# Patient Record
Sex: Male | Born: 1985 | Race: Black or African American | Hispanic: No | Marital: Single | Smoking: Current every day smoker
Health system: Southern US, Community
[De-identification: ages and names within clinical notes are randomized; demographics above are authoritative.]

## PROBLEM LIST (undated history)

## (undated) DIAGNOSIS — N186 End stage renal disease: Secondary | ICD-10-CM

## (undated) DIAGNOSIS — I739 Peripheral vascular disease, unspecified: Secondary | ICD-10-CM

## (undated) DIAGNOSIS — I10 Essential (primary) hypertension: Secondary | ICD-10-CM

## (undated) DIAGNOSIS — I2510 Atherosclerotic heart disease of native coronary artery without angina pectoris: Secondary | ICD-10-CM

## (undated) DIAGNOSIS — I639 Cerebral infarction, unspecified: Secondary | ICD-10-CM

## (undated) DIAGNOSIS — E782 Mixed hyperlipidemia: Secondary | ICD-10-CM

## (undated) DIAGNOSIS — E109 Type 1 diabetes mellitus without complications: Secondary | ICD-10-CM

## (undated) DIAGNOSIS — Z720 Tobacco use: Secondary | ICD-10-CM

## (undated) DIAGNOSIS — D638 Anemia in other chronic diseases classified elsewhere: Secondary | ICD-10-CM

## (undated) DIAGNOSIS — Z9289 Personal history of other medical treatment: Secondary | ICD-10-CM

## (undated) HISTORY — PX: OTHER SURGICAL HISTORY: SHX169

## (undated) HISTORY — PX: LEG AMPUTATION BELOW KNEE: SHX694

---

## 2015-03-21 DIAGNOSIS — E10649 Type 1 diabetes mellitus with hypoglycemia without coma: Secondary | ICD-10-CM | POA: Diagnosis present

## 2015-03-22 DIAGNOSIS — D631 Anemia in chronic kidney disease: Secondary | ICD-10-CM | POA: Diagnosis present

## 2015-03-22 DIAGNOSIS — N186 End stage renal disease: Secondary | ICD-10-CM | POA: Diagnosis present

## 2015-12-04 DIAGNOSIS — N186 End stage renal disease: Secondary | ICD-10-CM | POA: Diagnosis present

## 2015-12-04 DIAGNOSIS — I739 Peripheral vascular disease, unspecified: Secondary | ICD-10-CM | POA: Diagnosis present

## 2016-06-22 DIAGNOSIS — Z8673 Personal history of transient ischemic attack (TIA), and cerebral infarction without residual deficits: Secondary | ICD-10-CM

## 2016-06-22 DIAGNOSIS — F32A Depression, unspecified: Secondary | ICD-10-CM | POA: Diagnosis present

## 2016-06-22 DIAGNOSIS — F418 Other specified anxiety disorders: Secondary | ICD-10-CM | POA: Diagnosis present

## 2020-10-28 ENCOUNTER — Other Ambulatory Visit: Payer: Self-pay

## 2020-10-28 ENCOUNTER — Emergency Department
Admission: EM | Admit: 2020-10-28 | Discharge: 2020-10-28 | Disposition: A | Discharge status: Home / Self Care | Payer: Medicare PPO | Attending: Emergency Medicine | Admitting: Emergency Medicine

## 2020-10-28 ENCOUNTER — Emergency Department: Payer: Medicare PPO

## 2020-10-28 DIAGNOSIS — E1122 Type 2 diabetes mellitus with diabetic chronic kidney disease: Secondary | ICD-10-CM | POA: Insufficient documentation

## 2020-10-28 DIAGNOSIS — Z992 Dependence on renal dialysis: Secondary | ICD-10-CM | POA: Diagnosis not present

## 2020-10-28 DIAGNOSIS — N186 End stage renal disease: Secondary | ICD-10-CM

## 2020-10-28 DIAGNOSIS — R002 Palpitations: Secondary | ICD-10-CM | POA: Diagnosis not present

## 2020-10-28 DIAGNOSIS — I120 Hypertensive chronic kidney disease with stage 5 chronic kidney disease or end stage renal disease: Secondary | ICD-10-CM | POA: Diagnosis not present

## 2020-10-28 DIAGNOSIS — M79601 Pain in right arm: Secondary | ICD-10-CM | POA: Diagnosis not present

## 2020-10-28 DIAGNOSIS — R0789 Other chest pain: Secondary | ICD-10-CM | POA: Diagnosis not present

## 2020-10-28 DIAGNOSIS — M25561 Pain in right knee: Secondary | ICD-10-CM | POA: Diagnosis not present

## 2020-10-28 LAB — COMPREHENSIVE METABOLIC PANEL
ALT: 16 U/L (ref 0–44)
AST: 21 U/L (ref 15–41)
Albumin: 4.3 g/dL (ref 3.5–5.0)
Alkaline Phosphatase: 137 U/L — ABNORMAL HIGH (ref 38–126)
Anion gap: 18 — ABNORMAL HIGH (ref 5–15)
BUN: 48 mg/dL — ABNORMAL HIGH (ref 6–20)
CO2: 25 mmol/L (ref 22–32)
Calcium: 9.9 mg/dL (ref 8.9–10.3)
Chloride: 92 mmol/L — ABNORMAL LOW (ref 98–111)
Creatinine, Ser: 9.9 mg/dL — ABNORMAL HIGH (ref 0.61–1.24)
GFR, Estimated: 6 mL/min — ABNORMAL LOW (ref >60)
Glucose, Bld: 150 mg/dL — ABNORMAL HIGH (ref 70–99)
Potassium: 5.3 mmol/L — ABNORMAL HIGH (ref 3.5–5.1)
Sodium: 135 mmol/L (ref 135–145)
Total Bilirubin: 0.8 mg/dL (ref 0.3–1.2)
Total Protein: 8.4 g/dL — ABNORMAL HIGH (ref 6.5–8.1)

## 2020-10-28 LAB — CBC WITH DIFFERENTIAL/PLATELET
Abs Immature Granulocytes: 0.02 10*3/uL (ref 0.00–0.07)
Basophils Absolute: 0.1 10*3/uL (ref 0.0–0.1)
Basophils Relative: 1 %
Eosinophils Absolute: 0.4 10*3/uL (ref 0.0–0.5)
Eosinophils Relative: 5 %
HCT: 37.2 % — ABNORMAL LOW (ref 39.0–52.0)
Hemoglobin: 12.5 g/dL — ABNORMAL LOW (ref 13.0–17.0)
Immature Granulocytes: 0 %
Lymphocytes Relative: 27 %
Lymphs Abs: 2.2 10*3/uL (ref 0.7–4.0)
MCH: 31.2 pg (ref 26.0–34.0)
MCHC: 33.6 g/dL (ref 30.0–36.0)
MCV: 92.8 fL (ref 80.0–100.0)
Monocytes Absolute: 0.7 10*3/uL (ref 0.1–1.0)
Monocytes Relative: 9 %
Neutro Abs: 5 10*3/uL (ref 1.7–7.7)
Neutrophils Relative %: 58 %
Platelets: 191 10*3/uL (ref 150–400)
RBC: 4.01 MIL/uL — ABNORMAL LOW (ref 4.22–5.81)
RDW: 15.3 % (ref 11.5–15.5)
WBC: 8.4 10*3/uL (ref 4.0–10.5)
nRBC: 0 % (ref 0.0–0.2)

## 2020-10-28 LAB — TROPONIN I (HIGH SENSITIVITY)
Troponin I (High Sensitivity): 17 ng/L (ref <18)
Troponin I (High Sensitivity): 18 ng/L — ABNORMAL HIGH (ref <18)

## 2020-10-28 MED ORDER — MORPHINE SULFATE (PF) 4 MG/ML IV SOLN
4.0000 mg | Freq: Once | INTRAVENOUS | Status: DC
  Filled 2020-10-28: qty 1

## 2020-10-28 NOTE — ED Provider Notes (Addendum)
[facility] Emergency Department Provider Note ____________________________________________   Event Date/Time   First MD Initiated Contact with Patient 10/28/20 [phone]     (approximate)  I have reviewed the triage vital signs and the nursing notes.  HISTORY  Chief Complaint Chest Pain   HPI Raymond Lamb is a 35 y.o. malewho presents to the ED for evaluation of chest pain.   Chart review indicates no hx within our system.  Looks like majority of care is in the [facility] system.  History of HTN, HLD, DM, CVA on Plavix, GERD depression. Pt reports ESRD on T,Th,S iHD, last going on Tuesday. Reports hx stroke and cardiac arrest associated with severe hyperkalemia in the past.   Patient does to the ED for evaluation of 2-3 days of palpitations, and developing chest pain this evening.  Patient reports intermittent palpitations since finishing his dialysis session this past Tuesday, with a couple episodes per day lasting a matter of minutes before self resolving.  He reports developing chest pain/pressure while home alone this evening alongside his palpitations.  He reports rolling his wheelchair down the hallway, causing a stumbling fall where wheels caught and he fell forward catching himself with his right knee and right arm.  Patient reports resolving substernal chest pain, now 4/10 intensity.  Reports palpitations are also improving.  Does report 3/10 intensity right knee pain after his fall.   No past medical history on file.  There are no problems to display for this patient.    Prior to Admission medications   Not on File    Allergies Baclofen and Lisinopril  No family history on file.  Social History    Review of Systems  Constitutional: No fever/chills Eyes: No visual changes. ENT: No sore throat. Cardiovascular: Positive chest pain Respiratory: Denies shortness of breath. Gastrointestinal: No abdominal pain.  No nausea, no  vomiting.  No diarrhea.  No constipation. Genitourinary: Negative for dysuria. Musculoskeletal: Negative for back pain.  Positive for right knee pain after mechanical fall Skin: Negative for rash. Neurological: Negative for headaches, focal weakness or numbness.   ____________________________________________   PHYSICAL EXAM:  VITAL SIGNS: Vitals:   10/28/20 0400 10/28/20 0518  BP: (!) 176/98 (!) 161/97  Pulse: 84 80  Resp: 19 16  Temp:  98.2 F (36.8 C)  SpO2: 98% 100%    Constitutional: Alert and oriented. Well appearing and in no acute distress.  Pleasant and conversational in full sentences Eyes: Conjunctivae are normal. PERRL. EOMI. Head: Atraumatic. Nose: No congestion/rhinnorhea. Mouth/Throat: Mucous membranes are moist.  Oropharynx non-erythematous. Neck: No stridor. No cervical spine tenderness to palpation. Cardiovascular: Normal rate, regular rhythm. Grossly normal heart sounds.  Good peripheral circulation. Respiratory: Normal respiratory effort.  No retractions. Lungs CTAB. Gastrointestinal: Soft , nondistended, nontender to palpation. No CVA tenderness. Musculoskeletal:No joint effusions.  Mild tenderness over right-sided patella without evidence of effusion, laceration or open injury. Palpation of all 4 extremities otherwise without evidence of deformity, trauma or tenderness AV fistula to left lower arm with palpable thrill Neurologic:  Normal speech and language.  Contractures to left upper extremities at baseline, otherwise no evidence of focal neurologic deficits. Skin:  Skin is warm, dry and intact. No rash noted. Psychiatric: Mood and affect are normal. Speech and behavior are normal. ____________________________________________   LABS (all labs ordered are listed, but only abnormal results are displayed)  Labs Reviewed  COMPREHENSIVE METABOLIC PANEL - Abnormal; Notable for the following components:      Result  Value   Potassium 5.3 (*)    Chloride  92 (*)    Glucose, Bld 150 (*)    BUN 48 (*)    Creatinine, Ser 9.90 (*)    Total Protein 8.4 (*)    Alkaline Phosphatase 137 (*)    GFR, Estimated 6 (*)    Anion gap 18 (*)    All other components within normal limits  CBC WITH DIFFERENTIAL/PLATELET - Abnormal; Notable for the following components:   RBC 4.01 (*)    Hemoglobin 12.5 (*)    HCT 37.2 (*)    All other components within normal limits  TROPONIN I (HIGH SENSITIVITY) - Abnormal; Notable for the following components:   Troponin I (High Sensitivity) 18 (*)    All other components within normal limits  TROPONIN I (HIGH SENSITIVITY)   ____________________________________________  12 Lead EKG  Sinus rhythm, rate of 94 bpm.  Normal axis and intervals.  Stigmata of LVH.  No evidence of acute ischemia ____________________________________________  RADIOLOGY  ED MD interpretation: 2 view CXR reviewed by me without evidence of acute cardiopulmonary pathology  Official radiology report(s): DG Chest 2 View  Result Date: 10/28/2020 CLINICAL DATA:  Young dialysis patient atypical chest pain EXAM: CHEST - 2 VIEW COMPARISON:  None. FINDINGS: Some coarsened interstitial changes are noted throughout the lungs which can be seen as sequela of chronic dialysis. No significant pulmonary vascular congestion or convincing features of pulmonary edema are present at this time. No consolidation. No pneumothorax or visible effusion the portions of the costophrenic sulci are collimated. Cardiomediastinal contours are unremarkable. Telemetry leads overlie the chest. No acute osseous or soft tissue abnormality. IMPRESSION: No acute cardiopulmonary abnormality Chronically coarsened interstitial changes, can be seen in the setting of chronic dialysis. Electronically Signed   By: Lovena Le M.D.   On: 10/28/2020 03:44   DG Knee Complete 4 Views Right  Result Date: 10/28/2020 CLINICAL DATA:  Status post fall. EXAM: RIGHT KNEE - COMPLETE 4+ VIEW  COMPARISON:  None. FINDINGS: No evidence of fracture, dislocation, or joint effusion. No evidence of arthropathy or other focal bone abnormality. A radiopaque vascular stent is seen along the posteromedial aspect of the distal right thigh. IMPRESSION: No acute osseous abnormality. Electronically Signed   By: Virgina Norfolk M.D.   On: 10/28/2020 04:16    ____________________________________________   PROCEDURES and INTERVENTIONS  Procedure(s) performed (including Critical Care):  .1-3 Lead EKG Interpretation Performed by: Vladimir Crofts, MD Authorized by: Vladimir Crofts, MD     Interpretation: normal     ECG rate:  84   ECG rate assessment: normal     Rhythm: sinus rhythm     Ectopy: none     Conduction: normal      Medications  morphine 4 MG/ML injection 4 mg (has no administration in time range)    ____________________________________________   MDM / ED COURSE   35 year old patient on hemodialysis, but without cardiac history, presents to the ED with atypical chest pains, without evidence of acute pathology and amenable to outpatient management.  Mild and persistent hypertension, otherwise normal vitals on room air.  Exam reassuring without evidence of distress, neurologic or vascular deficits.  Minimal tenderness to his right-sided patella after mechanical fall today, and follow-up x-ray demonstrates no fracture or dislocation.  No evidence of ACS, PTX or CAP on his work-up.  Has resolution of chest pain and remains asymptomatic in the ED. blood work shows minimal hyperkalemia, in the setting of hemolysis on lab draw,  his reported adherence to dialysis, and no EKG evidence of hyper kalemia.  I see no barriers to outpatient management at this time and return precautions for the ED were discussed.   Clinical Course as of 10/28/20 0610  Thu Oct 28, 2020  0607 Reassessed.  Patient was feeling well and has no chest pain.  We discussed benign cardiac work-up.  We discussed the  importance of adherence to his dialysis regimen.  We discussed return precautions for the ED. [DS]    Clinical Course User Index [DS] Vladimir Crofts, MD    ____________________________________________   FINAL CLINICAL IMPRESSION(S) / ED DIAGNOSES  Final diagnoses:  Other chest pain  ESRD (end stage renal disease) on dialysis [facility])     ED Discharge Orders    None       Leane Loring   Note:  This document was prepared using Dragon voice recognition software and may include unintentional dictation errors.   Vladimir Crofts, MD 10/28/20 [place], [place], MD 10/28/20 [phone]

## 2020-10-28 NOTE — ED Triage Notes (Signed)
Pt BIBA for eval of mid sternal chest pressure, 6/10 now, given 324 mg ASA by EMS, pt in NAD, dialysis pt T/Th/Sat, last appt 2 days ago

## 2020-10-28 NOTE — ED Notes (Signed)
Offered ordered morphine to pt, pt declined at this time, sts "I don't think I need morphine right now, the pain is easing up"; pt a/o x 4 GCS 15, ED MD made aware

## 2020-10-28 NOTE — Discharge Instructions (Signed)
As we discussed, no signs of heart attack or similar issues with your heart.  Please ensure you continue your dialysis.  Return to the ED with any further worsening chest pain

## 2020-11-16 DIAGNOSIS — I2510 Atherosclerotic heart disease of native coronary artery without angina pectoris: Secondary | ICD-10-CM | POA: Diagnosis present

## 2020-11-30 ENCOUNTER — Encounter: Payer: Self-pay | Admitting: Internal Medicine

## 2020-11-30 ENCOUNTER — Other Ambulatory Visit: Payer: Self-pay

## 2020-11-30 ENCOUNTER — Inpatient Hospital Stay
Admission: EM | Admit: 2020-11-30 | Discharge: 2020-12-02 | DRG: 638 | Disposition: A | Discharge status: Home / Self Care | Payer: Medicare PPO | Attending: Internal Medicine | Admitting: Internal Medicine

## 2020-11-30 DIAGNOSIS — E1022 Type 1 diabetes mellitus with diabetic chronic kidney disease: Secondary | ICD-10-CM | POA: Diagnosis present

## 2020-11-30 DIAGNOSIS — I2510 Atherosclerotic heart disease of native coronary artery without angina pectoris: Secondary | ICD-10-CM | POA: Diagnosis present

## 2020-11-30 DIAGNOSIS — Z992 Dependence on renal dialysis: Secondary | ICD-10-CM

## 2020-11-30 DIAGNOSIS — Z885 Allergy status to narcotic agent status: Secondary | ICD-10-CM

## 2020-11-30 DIAGNOSIS — I69354 Hemiplegia and hemiparesis following cerebral infarction affecting left non-dominant side: Secondary | ICD-10-CM

## 2020-11-30 DIAGNOSIS — Z794 Long term (current) use of insulin: Secondary | ICD-10-CM

## 2020-11-30 DIAGNOSIS — Z993 Dependence on wheelchair: Secondary | ICD-10-CM

## 2020-11-30 DIAGNOSIS — Z7902 Long term (current) use of antithrombotics/antiplatelets: Secondary | ICD-10-CM

## 2020-11-30 DIAGNOSIS — Z7982 Long term (current) use of aspirin: Secondary | ICD-10-CM

## 2020-11-30 DIAGNOSIS — Z888 Allergy status to other drugs, medicaments and biological substances status: Secondary | ICD-10-CM

## 2020-11-30 DIAGNOSIS — E875 Hyperkalemia: Secondary | ICD-10-CM | POA: Diagnosis present

## 2020-11-30 DIAGNOSIS — Z91041 Radiographic dye allergy status: Secondary | ICD-10-CM

## 2020-11-30 DIAGNOSIS — Z9111 Patient's noncompliance with dietary regimen: Secondary | ICD-10-CM

## 2020-11-30 DIAGNOSIS — I739 Peripheral vascular disease, unspecified: Secondary | ICD-10-CM

## 2020-11-30 DIAGNOSIS — N2581 Secondary hyperparathyroidism of renal origin: Secondary | ICD-10-CM | POA: Diagnosis present

## 2020-11-30 DIAGNOSIS — F418 Other specified anxiety disorders: Secondary | ICD-10-CM | POA: Diagnosis present

## 2020-11-30 DIAGNOSIS — F32A Depression, unspecified: Secondary | ICD-10-CM | POA: Diagnosis not present

## 2020-11-30 DIAGNOSIS — E162 Hypoglycemia, unspecified: Secondary | ICD-10-CM | POA: Diagnosis present

## 2020-11-30 DIAGNOSIS — Z833 Family history of diabetes mellitus: Secondary | ICD-10-CM

## 2020-11-30 DIAGNOSIS — Z87891 Personal history of nicotine dependence: Secondary | ICD-10-CM

## 2020-11-30 DIAGNOSIS — N186 End stage renal disease: Secondary | ICD-10-CM | POA: Diagnosis present

## 2020-11-30 DIAGNOSIS — R4189 Other symptoms and signs involving cognitive functions and awareness: Secondary | ICD-10-CM

## 2020-11-30 DIAGNOSIS — E10649 Type 1 diabetes mellitus with hypoglycemia without coma: Principal | ICD-10-CM | POA: Diagnosis present

## 2020-11-30 DIAGNOSIS — I120 Hypertensive chronic kidney disease with stage 5 chronic kidney disease or end stage renal disease: Secondary | ICD-10-CM | POA: Diagnosis present

## 2020-11-30 DIAGNOSIS — Z79899 Other long term (current) drug therapy: Secondary | ICD-10-CM

## 2020-11-30 DIAGNOSIS — E1051 Type 1 diabetes mellitus with diabetic peripheral angiopathy without gangrene: Secondary | ICD-10-CM | POA: Diagnosis present

## 2020-11-30 DIAGNOSIS — D631 Anemia in chronic kidney disease: Secondary | ICD-10-CM | POA: Diagnosis present

## 2020-11-30 DIAGNOSIS — Z8673 Personal history of transient ischemic attack (TIA), and cerebral infarction without residual deficits: Secondary | ICD-10-CM

## 2020-11-30 DIAGNOSIS — Z20822 Contact with and (suspected) exposure to covid-19: Secondary | ICD-10-CM | POA: Diagnosis present

## 2020-11-30 HISTORY — DX: Type 1 diabetes mellitus without complications: E10.9

## 2020-11-30 LAB — COMPREHENSIVE METABOLIC PANEL
ALT: 22 U/L (ref 0–44)
AST: 19 U/L (ref 15–41)
Albumin: 3.4 g/dL — ABNORMAL LOW (ref 3.5–5.0)
Alkaline Phosphatase: 141 U/L — ABNORMAL HIGH (ref 38–126)
Anion gap: 10 (ref 5–15)
BUN: 22 mg/dL — ABNORMAL HIGH (ref 6–20)
CO2: 27 mmol/L (ref 22–32)
Calcium: 8.6 mg/dL — ABNORMAL LOW (ref 8.9–10.3)
Chloride: 99 mmol/L (ref 98–111)
Creatinine, Ser: 4.78 mg/dL — ABNORMAL HIGH (ref 0.61–1.24)
GFR, Estimated: 15 mL/min — ABNORMAL LOW (ref >60)
Glucose, Bld: 93 mg/dL (ref 70–99)
Potassium: 4.1 mmol/L (ref 3.5–5.1)
Sodium: 136 mmol/L (ref 135–145)
Total Bilirubin: 0.6 mg/dL (ref 0.3–1.2)
Total Protein: 7.6 g/dL (ref 6.5–8.1)

## 2020-11-30 LAB — CBC
HCT: 27.8 % — ABNORMAL LOW (ref 39.0–52.0)
Hemoglobin: 8.7 g/dL — ABNORMAL LOW (ref 13.0–17.0)
MCH: 30.5 pg (ref 26.0–34.0)
MCHC: 31.3 g/dL (ref 30.0–36.0)
MCV: 97.5 fL (ref 80.0–100.0)
Platelets: 280 10*3/uL (ref 150–400)
RBC: 2.85 MIL/uL — ABNORMAL LOW (ref 4.22–5.81)
RDW: 15.7 % — ABNORMAL HIGH (ref 11.5–15.5)
WBC: 9.5 10*3/uL (ref 4.0–10.5)
nRBC: 0 % (ref 0.0–0.2)

## 2020-11-30 LAB — RESP PANEL BY RT-PCR (FLU A&B, COVID) ARPGX2
Influenza A by PCR: NEGATIVE
Influenza B by PCR: NEGATIVE
SARS Coronavirus 2 by RT PCR: NEGATIVE

## 2020-11-30 LAB — TSH: TSH: 1.799 u[IU]/mL (ref 0.350–4.500)

## 2020-11-30 LAB — MAGNESIUM: Magnesium: 2 mg/dL (ref 1.7–2.4)

## 2020-11-30 LAB — CBG MONITORING, ED
Glucose-Capillary: 94 mg/dL (ref 70–99)
Glucose-Capillary: 51 mg/dL — ABNORMAL LOW (ref 70–99)

## 2020-11-30 MED ORDER — ASPIRIN EC 81 MG PO TBEC
81.0000 mg | DELAYED_RELEASE_TABLET | Freq: Every day | ORAL | Status: DC
  Administered 2020-12-01 – 2020-12-02 (×2): 81 mg via ORAL
  Filled 2020-11-30 (×3): qty 1

## 2020-11-30 MED ORDER — ACETAMINOPHEN 325 MG PO TABS: 650.0000 mg | ORAL_TABLET | Freq: Four times a day (QID) | ORAL | Status: DC | PRN

## 2020-11-30 MED ORDER — DEXTROSE-NACL 10-0.45 % IV SOLN: INTRAVENOUS | Status: DC

## 2020-11-30 MED ORDER — POLYETHYLENE GLYCOL 3350 17 G PO PACK: 17.0000 g | PACK | Freq: Every day | ORAL | Status: DC | PRN

## 2020-11-30 MED ORDER — HYDRALAZINE HCL 50 MG PO TABS
100.0000 mg | ORAL_TABLET | Freq: Three times a day (TID) | ORAL | Status: DC
  Administered 2020-12-01 – 2020-12-02 (×3): 100 mg via ORAL
  Filled 2020-11-30 (×3): qty 2

## 2020-11-30 MED ORDER — AMLODIPINE BESYLATE 10 MG PO TABS
10.0000 mg | ORAL_TABLET | Freq: Every day | ORAL | Status: DC
  Administered 2020-12-01 – 2020-12-02 (×2): 10 mg via ORAL
  Filled 2020-11-30: qty 1
  Filled 2020-11-30: qty 2
  Filled 2020-11-30: qty 1

## 2020-11-30 MED ORDER — DEXTROSE 10 % IV SOLN: INTRAVENOUS | Status: DC

## 2020-11-30 MED ORDER — SODIUM CHLORIDE 0.9% FLUSH
3.0000 mL | Freq: Two times a day (BID) | INTRAVENOUS | Status: DC
  Administered 2020-12-01 (×2): 3 mL via INTRAVENOUS

## 2020-11-30 MED ORDER — CLOPIDOGREL BISULFATE 75 MG PO TABS
75.0000 mg | ORAL_TABLET | Freq: Every day | ORAL | Status: DC
  Administered 2020-12-01 – 2020-12-02 (×2): 75 mg via ORAL
  Filled 2020-11-30 (×2): qty 1

## 2020-11-30 MED ORDER — PANTOPRAZOLE SODIUM 40 MG PO TBEC
40.0000 mg | DELAYED_RELEASE_TABLET | Freq: Every day | ORAL | Status: DC
  Administered 2020-12-01 – 2020-12-02 (×2): 40 mg via ORAL
  Filled 2020-11-30 (×2): qty 1

## 2020-11-30 MED ORDER — ATORVASTATIN CALCIUM 20 MG PO TABS
80.0000 mg | ORAL_TABLET | Freq: Every day | ORAL | Status: DC
  Administered 2020-12-01 – 2020-12-02 (×2): 80 mg via ORAL
  Filled 2020-11-30 (×3): qty 4

## 2020-11-30 MED ORDER — ACETAMINOPHEN 650 MG RE SUPP: 650.0000 mg | Freq: Four times a day (QID) | RECTAL | Status: DC | PRN

## 2020-11-30 MED ORDER — CARVEDILOL 25 MG PO TABS
37.5000 mg | ORAL_TABLET | Freq: Two times a day (BID) | ORAL | Status: DC
  Administered 2020-12-01 – 2020-12-02 (×3): 37.5 mg via ORAL
  Filled 2020-11-30 (×3): qty 1

## 2020-11-30 MED ORDER — HEPARIN SODIUM (PORCINE) 5000 UNIT/ML IJ SOLN
5000.0000 [IU] | Freq: Three times a day (TID) | INTRAMUSCULAR | Status: DC
  Administered 2020-12-01 – 2020-12-02 (×4): 5000 [IU] via SUBCUTANEOUS
  Filled 2020-11-30 (×5): qty 1

## 2020-11-30 MED ORDER — SODIUM CHLORIDE 0.9 % IV BOLUS: 1000.0000 mL | Freq: Once | INTRAVENOUS | Status: DC

## 2020-11-30 NOTE — ED Notes (Signed)
MD notified regarding pt's hypoglycemia.

## 2020-11-30 NOTE — ED Provider Notes (Addendum)
[facility] Emergency Department Provider Note  Time seen: 9:51 PM  I have reviewed the triage vital signs and the nursing notes.   HISTORY  Chief Complaint Hypoglycemia   HPI Raymond Lamb is a 35 y.o. male with a past medical history of end-stage renal disease on hemodialysis, diabetes on insulin, presents to the emergency department for hypoglycemia.  According to EMS patient's mother called due to the patient being unresponsive.  EMS arrived found the patient have a blood glucose of 25.  Patient received dextrose by EMS which increased  blood sugar to 226 per EMS, recheck right before arrival of 126.  EMS states initially patient unresponsive to verbal and physical stimuli.  Upon arrival patient is somnolent but awakens to verbal stimuli.  Is able to converse however will fall asleep if not actively engaged.  Significant improvement per EMS.  While awake patient denies any chest pain or abdominal pain.  Largely negative review of systems.  No past medical history on file.  There are no problems to display for this patient.   Prior to Admission medications   Not on File    Allergies  Allergen Reactions  . Baclofen Itching  . Lisinopril Swelling    No family history on file.  Social History    Review of Systems Constitutional: Negative for fever. Cardiovascular: Negative for chest pain. Respiratory: Negative for shortness of breath. Gastrointestinal: Negative for abdominal pain Musculoskeletal: Negative for musculoskeletal complaints All other ROS negative  ____________________________________________   PHYSICAL EXAM:  VITAL SIGNS: ED Triage Vitals [11/30/20 2147]  Enc Vitals Group     BP      Pulse      Resp      Temp      Temp src      SpO2      Weight      Height      Head Circumference      Peak Flow      Pain Score 0     Pain Loc      Pain Edu?      Excl. in [place]?     Constitutional: Patient is somnolent but will awaken  to voice and answer questions before falling back asleep. Eyes: Normal exam ENT      Head: Normocephalic and atraumatic.      Mouth/Throat: Mucous membranes are moist. Cardiovascular: Normal rate, regular rhythm. Respiratory: Normal respiratory effort without tachypnea nor retractions. Breath sounds are clear Gastrointestinal: Soft and nontender. No distention. Musculoskeletal: Left upper extremity AV fistula at the wrist with good thrill Neurologic:  Normal speech and language. No gross focal neurologic deficits  Skin:  Skin is warm, dry and intact.  Psychiatric: Mood and affect are normal.   ____________________________________________    EKG  EKG viewed and interpreted by myself shows a sinus rhythm at 74 bpm with a narrow QRS, normal axis, normal intervals, no concerning ST changes.  ____________________________________________   INITIAL IMPRESSION / ASSESSMENT AND PLAN / ED COURSE  Pertinent labs & imaging results that were available during my care of the patient were reviewed by me and considered in my medical decision making (see chart for details).   Patient presents to the emergency department for altered mental status found to have a blood glucose of 25, known diabetic per EMS.  Patient is end-stage renal on hemodialysis.  We will check labs, and continue to closely monitor.  We will maintain hourly CBGs to ensure no return to hypoglycemia.  Patient is somewhat somnolent but overall does appear well.  Highly suspect hypoglycemia to be the cause of the patient's initial unresponsiveness.  Patient has become somnolent once again is dropped his blood glucose to 51 on recheck.  Given the patient's return to hypoglycemia we will start the patient on a D10 infusion and admit to the hospital service for further work-up and treatment.  Reassuringly patient's potassium is 4.1 does not require emergent dialysis.  Record review in Care Everywhere/[facility] shows patient is a type I diabetic  with past CVA with left-sided deficits, on hemodialysis.  Raymond Lamb was evaluated in Emergency Department on 11/30/2020 for the symptoms described in the history of present illness. He was evaluated in the context of the global COVID-19 pandemic, which necessitated consideration that the patient might be at risk for infection with the SARS-CoV-2 virus that causes COVID-19. Institutional protocols and algorithms that pertain to the evaluation of patients at risk for COVID-19 are in a state of rapid change based on information released by regulatory bodies including the CDC and federal and state organizations. These policies and algorithms were followed during the patient's care in the ED.  ____________________________________________   FINAL CLINICAL IMPRESSION(S) / ED DIAGNOSES  Hypoglycemia   Harvest Dark, MD 11/30/20 2252    Harvest Dark, MD 11/30/20 2255

## 2020-11-30 NOTE — ED Notes (Signed)
Resumed care from zach rn.  Pt drowsy.  nsr on monitor.  Iv fluids infusing.

## 2020-11-30 NOTE — ED Triage Notes (Addendum)
Pt presents to the [facility] via EMS from home with c/o hypoglycemia. EMS states that pt's mother called EMS after pt became unresponsive. EMS states that pt was found with blood glucose of 25. Pt received D10 en route which increased blood sugar to 250. Pt's blood sugar 95 upon arrival to ED. Pt only responsive to painful stimuli at this time.

## 2020-11-30 NOTE — H&P (Signed)
History and Physical   Auther Lyerly KCX:[id number] DOB: 01/12/1986 DOA: 11/30/2020  PCP: Norina Buzzard, MD   Patient coming from: Home  Chief Complaint: Hypoglycemia  HPI: Raymond Lamb is a 35 y.o. male with medical history significant of type 1 diabetes, anemia, ESRD on HD, PVD, history of CVA with left-sided weakness, depression, CAD who presents with hypoglycemia.  Patient's mother went to check on him and found him to be unresponsive.  She called EMS and when they arrived he was unresponsive with a CBG of 25.  He got at the 10 drip in route which improved his CBG to the 200s and then was around the 90s on arrival.  He had improved somewhat and become responsive with verbal stimulation but still somnolent.  His glucose then dropped back down to 50 and he was put back on the D10 drip and has had improvement of his glucose again.  Patient recently admitted to [facility] from 5/23 until 5/28 with DKA among other things.  In that discharge summary they noted that his insulin had previously been managed by his family but he did start managing it himself in the week prior to admission after an argument with family.  And he subsequently had this episode of hyperglycemia that he was admitted for and now coming in with hypoglycemia.  He was discharged from that admission on 25 units nightly, 10 units 3 times daily and sliding scale insulin.  He reports taking his usual dose of insulin today.  However he states he did not eat anything all day because he just did not feel hungry.  He also states he completed dialysis today. He denies fevers, chills, chest pain, shortness of breath, abdominal pain, constipation diarrhea, nausea, vomiting.  I spoke with his mother who reports that she feels he uses his short acting insulin too frequently including when he is not eating.  She feels that when he was discharged from [facility] his dose was little too high and that he has had variable blood sugars due to his use of short  acting and has had more lows than highs.  ED Course:Vital signs in the ED stable.  Lab work-up showed CMP with BUN 22, creatinine stable at 4.78, glucose 93, calcium 8.6 which improved considering albumin 3.4, alk phos stable 141.  CBC with stable anemia with hemoglobin of 8.7.  CBG initially 94 with 51 on repeat as per HPI.  Respiratory panel for flu and COVID pending.  Patient was started on D10 drip in the ED.  Review of Systems: As per HPI otherwise all other systems reviewed and are negative.  History reviewed. No pertinent past medical history. Diabetes Anemia, ESRD PVD CVA Depression CAD  History reviewed. No pertinent surgical history.  Social History  reports that he has quit smoking. He has never used smokeless tobacco. He reports current drug use. Drug: Marijuana. He reports that he does not drink alcohol.  Allergies  Allergen Reactions  . Baclofen Itching  . Lisinopril Swelling    Family History  Problem Relation Age of Onset  . Diabetes type I Other   . Clotting disorder Neg Hx   Reviewed on admission  Prior to Admission medications   Not on File  Aspirin 81 mg daily Amlodipine 10 mg daily Atorvastatin 80 mg daily Coreg 37.5 mg twice daily Plavix 75 mg daily Gabapentin 600 mg nightly Hydralazine 100 mg every 8 hours Hydroxyzine 10 mg as needed itching Omeprazole Sertraline 100 mg daily  Physical Exam: Vitals:  11/30/20 2330 11/30/20 2335 11/30/20 2340 11/30/20 2345  BP:   (!) 144/97   Pulse: 92 77 75 83  Resp: _0 Temp:      TempSrc:      SpO2: 100% 99% 99% 98%   Physical Exam Constitutional:      General: He is not in acute distress.    Appearance: Normal appearance.     Comments: Lethargic  HENT:     Head: Normocephalic and atraumatic.     Mouth/Throat:     Mouth: Mucous membranes are moist.     Pharynx: Oropharynx is clear.  Eyes:     Extraocular Movements: Extraocular movements intact.     Pupils: Pupils are equal, round,  and reactive to light.  Cardiovascular:     Rate and Rhythm: Normal rate and regular rhythm.     Pulses: Normal pulses.     Heart sounds: Normal heart sounds.  Pulmonary:     Effort: Pulmonary effort is normal. No respiratory distress.     Breath sounds: Normal breath sounds.  Abdominal:     General: Bowel sounds are normal. There is no distension.     Palpations: Abdomen is soft.     Tenderness: There is no abdominal tenderness.  Musculoskeletal:        General: Deformity (Left-sided contractures) present. No swelling.  Skin:    General: Skin is warm and dry.  Neurological:     General: No focal deficit present.     Mental Status: Mental status is at baseline.     Comments: Lethargic, but arousable to voice and answers questions appropriately when alert.    Labs on Admission: I have personally reviewed following labs and imaging studies  CBC: Recent Labs  Lab 11/30/20 2148  WBC 9.5  HGB 8.7*  HCT 27.8*  MCV 97.5  PLT 017    Basic Metabolic Panel: Recent Labs  Lab 11/30/20 2148  NA 136  K 4.1  CL 99  CO2 27  GLUCOSE 93  BUN 22*  CREATININE 4.78*  CALCIUM 8.6*  MG 2.0    GFR: CrCl cannot be calculated (Unknown ideal weight.).  Liver Function Tests: Recent Labs  Lab 11/30/20 2148  AST 19  ALT 22  ALKPHOS 141*  BILITOT 0.6  PROT 7.6  ALBUMIN 3.4*    Urine analysis: No results found for: COLORURINE, APPEARANCEUR, LABSPEC, PHURINE, GLUCOSEU, HGBUR, BILIRUBINUR, KETONESUR, PROTEINUR, UROBILINOGEN, NITRITE, LEUKOCYTESUR  Radiological Exams on Admission: No results found.  EKG: Independently reviewed.  Sinus rhythm at 74 bpm. QTC borderline at 496.  Assessment/Plan Principal Problem:   Type 1 diabetes mellitus with hypoglycemia without coma (HCC) Active Problems:   Anemia in chronic kidney disease (CKD)   Depression   ESRD (end stage renal disease) (HCC)   History of CVA (cerebrovascular accident)   PVD (peripheral vascular disease) ([place])    CAD (coronary artery disease)  Type 1 diabetes with hypoglycemia without coma Altered mental status > Patient presenting after being found unconscious with low CBG 25.  Some improvement in alertness with D10 drip and improvement in CBG becoming arousable and able to answer questions, however his glucose subsequently dropped again but has improved again while remaining on drip. > As per HPI, patient recently admitted to [facility] with DKA after he began managing his own insulin, now coming in with hypoglycemia suspected to be due to taking usual insulin dose despite not eating.  Discharged on 25 units nightly, 10 units 3 times daily and  SSI.  His mom states that she feels his glucose has been more variable as he relies more on short acting and that he has had more lows than highs, so he will likely need adjustment in his insulin dosing. > Continue with D10 (instead of D5) as he is end-stage renal as below we want to avoid giving too much fluid. - Monitor on telemetry for now - Continue D10 drip - Every hour CBGs, until stable for at least 2 CBGs. - Once blood glucose becomes stable and if it begins to become hyperglycemic can restart insulin therapy. - Check magnesium, TSH, UDS for completeness  ESRD on HD Tuesday Thursday Saturday > Last HD session was today > No significant electrolyte abnormalities in ED with creatinine stable at 4.78. - Avoid nephrotoxic agents - Trend renal function and electrolytes  Anemia of chronic kidney disease > Hemoglobin stable at 8.7 - Trend CBC  PVD History of CVA > With residual left-sided deficits, largely wheelchair-bound per report - Continue home aspirin, Plavix, atorvastatin  Hypertension CAD > Had type II (demand) ischemia during recent admission, found to have nonobstructive CAD. - Continue aspirin, amlodipine, Coreg, hydralazine  Depression - Holding sertraline while somnolent  DVT prophylaxis: Heparin  Code Status:   Full  Family Communication:   Mother updated by phone.  States she can be contacted with updates and and contact him when he is ready to leave as well. Disposition Plan:   Patient is from:  Home  Anticipated DC to:  Home  Anticipated DC date:  1 to 2 days  Anticipated DC barriers: None  Consults called:  None  Admission status:  Observation, stepdown   Severity of Illness: The appropriate patient status for this patient is OBSERVATION. Observation status is judged to be reasonable and necessary in order to provide the required intensity of service to ensure the patient's safety. The patient's presenting symptoms, physical exam findings, and initial radiographic and laboratory data in the context of their medical condition is felt to place them at decreased risk for further clinical deterioration. Furthermore, it is anticipated that the patient will be medically stable for discharge from the hospital within 2 midnights of admission. The following factors support the patient status of observation.   " The patient's presenting symptoms include altered mental status, hypoglycemia. " The physical exam findings include somnolence, chronic left-sided weakness and contractures. " The initial radiographic and laboratory data are CBG in the 50s, while on D10 drip.  CBG in the field 25 per report.   Marcelyn Bruins MD [facility]  How to contact the [facility] Attending or Consulting provider [place] or covering provider during after hours [place], for this patient?   1. Check the care team in [facility] and look for a) attending/consulting TR[facility] provider listed and b) the [facility] team listed 2. Log into [email or web address] and use [place]'s universal password to access. If you do not have the password, please contact the hospital operator. 3. Locate the [facility] provider you are looking for under [facility] and page to a number that you can be directly reached. 4. If you still have difficulty reaching the provider, please page the [facility]  (Director on Call) for the Hospitalists listed on amion for assistance.  12/01/2020, 12:01 AM

## 2020-12-01 DIAGNOSIS — D631 Anemia in chronic kidney disease: Secondary | ICD-10-CM | POA: Diagnosis present

## 2020-12-01 DIAGNOSIS — E875 Hyperkalemia: Secondary | ICD-10-CM | POA: Diagnosis present

## 2020-12-01 DIAGNOSIS — Z7902 Long term (current) use of antithrombotics/antiplatelets: Secondary | ICD-10-CM | POA: Diagnosis not present

## 2020-12-01 DIAGNOSIS — Z9111 Patient's noncompliance with dietary regimen: Secondary | ICD-10-CM | POA: Diagnosis not present

## 2020-12-01 DIAGNOSIS — E162 Hypoglycemia, unspecified: Secondary | ICD-10-CM | POA: Diagnosis present

## 2020-12-01 DIAGNOSIS — F32A Depression, unspecified: Secondary | ICD-10-CM | POA: Diagnosis present

## 2020-12-01 DIAGNOSIS — Z833 Family history of diabetes mellitus: Secondary | ICD-10-CM | POA: Diagnosis not present

## 2020-12-01 DIAGNOSIS — Z20822 Contact with and (suspected) exposure to covid-19: Secondary | ICD-10-CM | POA: Diagnosis present

## 2020-12-01 DIAGNOSIS — Z992 Dependence on renal dialysis: Secondary | ICD-10-CM | POA: Diagnosis not present

## 2020-12-01 DIAGNOSIS — I69354 Hemiplegia and hemiparesis following cerebral infarction affecting left non-dominant side: Secondary | ICD-10-CM | POA: Diagnosis not present

## 2020-12-01 DIAGNOSIS — I120 Hypertensive chronic kidney disease with stage 5 chronic kidney disease or end stage renal disease: Secondary | ICD-10-CM | POA: Diagnosis present

## 2020-12-01 DIAGNOSIS — Z794 Long term (current) use of insulin: Secondary | ICD-10-CM | POA: Diagnosis not present

## 2020-12-01 DIAGNOSIS — I2510 Atherosclerotic heart disease of native coronary artery without angina pectoris: Secondary | ICD-10-CM | POA: Diagnosis present

## 2020-12-01 DIAGNOSIS — E10649 Type 1 diabetes mellitus with hypoglycemia without coma: Secondary | ICD-10-CM | POA: Diagnosis present

## 2020-12-01 DIAGNOSIS — Z888 Allergy status to other drugs, medicaments and biological substances status: Secondary | ICD-10-CM | POA: Diagnosis not present

## 2020-12-01 DIAGNOSIS — Z885 Allergy status to narcotic agent status: Secondary | ICD-10-CM | POA: Diagnosis not present

## 2020-12-01 DIAGNOSIS — N2581 Secondary hyperparathyroidism of renal origin: Secondary | ICD-10-CM | POA: Diagnosis present

## 2020-12-01 DIAGNOSIS — Z993 Dependence on wheelchair: Secondary | ICD-10-CM | POA: Diagnosis not present

## 2020-12-01 DIAGNOSIS — Z87891 Personal history of nicotine dependence: Secondary | ICD-10-CM | POA: Diagnosis not present

## 2020-12-01 DIAGNOSIS — E1022 Type 1 diabetes mellitus with diabetic chronic kidney disease: Secondary | ICD-10-CM | POA: Diagnosis present

## 2020-12-01 DIAGNOSIS — Z91041 Radiographic dye allergy status: Secondary | ICD-10-CM | POA: Diagnosis not present

## 2020-12-01 DIAGNOSIS — N186 End stage renal disease: Secondary | ICD-10-CM | POA: Diagnosis present

## 2020-12-01 DIAGNOSIS — E1051 Type 1 diabetes mellitus with diabetic peripheral angiopathy without gangrene: Secondary | ICD-10-CM | POA: Diagnosis present

## 2020-12-01 DIAGNOSIS — Z79899 Other long term (current) drug therapy: Secondary | ICD-10-CM | POA: Diagnosis not present

## 2020-12-01 DIAGNOSIS — Z7982 Long term (current) use of aspirin: Secondary | ICD-10-CM | POA: Diagnosis not present

## 2020-12-01 LAB — CBC
HCT: 27 % — ABNORMAL LOW (ref 39.0–52.0)
Hemoglobin: 8.7 g/dL — ABNORMAL LOW (ref 13.0–17.0)
MCH: 30.9 pg (ref 26.0–34.0)
MCHC: 32.2 g/dL (ref 30.0–36.0)
MCV: 95.7 fL (ref 80.0–100.0)
Platelets: 298 10*3/uL (ref 150–400)
RBC: 2.82 MIL/uL — ABNORMAL LOW (ref 4.22–5.81)
RDW: 15.6 % — ABNORMAL HIGH (ref 11.5–15.5)
WBC: 12.1 10*3/uL — ABNORMAL HIGH (ref 4.0–10.5)
nRBC: 0 % (ref 0.0–0.2)

## 2020-12-01 LAB — GLUCOSE, CAPILLARY
Glucose-Capillary: 131 mg/dL — ABNORMAL HIGH (ref 70–99)
Glucose-Capillary: 40 mg/dL — CL (ref 70–99)
Glucose-Capillary: 371 mg/dL — ABNORMAL HIGH (ref 70–99)
Glucose-Capillary: 194 mg/dL — ABNORMAL HIGH (ref 70–99)
Glucose-Capillary: 146 mg/dL — ABNORMAL HIGH (ref 70–99)

## 2020-12-01 LAB — COMPREHENSIVE METABOLIC PANEL
ALT: 21 U/L (ref 0–44)
AST: 21 U/L (ref 15–41)
Albumin: 3 g/dL — ABNORMAL LOW (ref 3.5–5.0)
Alkaline Phosphatase: 132 U/L — ABNORMAL HIGH (ref 38–126)
Anion gap: 14 (ref 5–15)
BUN: 31 mg/dL — ABNORMAL HIGH (ref 6–20)
CO2: 22 mmol/L (ref 22–32)
Calcium: 8 mg/dL — ABNORMAL LOW (ref 8.9–10.3)
Chloride: 96 mmol/L — ABNORMAL LOW (ref 98–111)
Creatinine, Ser: 5.4 mg/dL — ABNORMAL HIGH (ref 0.61–1.24)
GFR, Estimated: 13 mL/min — ABNORMAL LOW (ref >60)
Glucose, Bld: 401 mg/dL — ABNORMAL HIGH (ref 70–99)
Potassium: 5.6 mmol/L — ABNORMAL HIGH (ref 3.5–5.1)
Sodium: 132 mmol/L — ABNORMAL LOW (ref 135–145)
Total Bilirubin: 1 mg/dL (ref 0.3–1.2)
Total Protein: 6.7 g/dL (ref 6.5–8.1)

## 2020-12-01 LAB — CBG MONITORING, ED
Glucose-Capillary: 177 mg/dL — ABNORMAL HIGH (ref 70–99)
Glucose-Capillary: 191 mg/dL — ABNORMAL HIGH (ref 70–99)
Glucose-Capillary: 240 mg/dL — ABNORMAL HIGH (ref 70–99)
Glucose-Capillary: 385 mg/dL — ABNORMAL HIGH (ref 70–99)
Glucose-Capillary: 390 mg/dL — ABNORMAL HIGH (ref 70–99)
Glucose-Capillary: 95 mg/dL (ref 70–99)

## 2020-12-01 LAB — HIV ANTIBODY (ROUTINE TESTING W REFLEX): HIV Screen 4th Generation wRfx: NONREACTIVE

## 2020-12-01 LAB — MRSA PCR SCREENING: MRSA by PCR: NEGATIVE

## 2020-12-01 MED ORDER — CHLORHEXIDINE GLUCONATE CLOTH 2 % EX PADS
6.0000 | MEDICATED_PAD | Freq: Every day | CUTANEOUS | Status: DC
  Administered 2020-12-02: 6 via TOPICAL

## 2020-12-01 MED ORDER — INSULIN GLARGINE 100 UNIT/ML ~~LOC~~ SOLN
5.0000 [IU] | Freq: Every day | SUBCUTANEOUS | Status: DC
  Administered 2020-12-01: 5 [IU] via SUBCUTANEOUS
  Filled 2020-12-01 (×2): qty 0.05

## 2020-12-01 MED ORDER — INSULIN ASPART 100 UNIT/ML IJ SOLN
0.0000 [IU] | INTRAMUSCULAR | Status: DC
  Administered 2020-12-01: 20 [IU] via SUBCUTANEOUS
  Administered 2020-12-01: 4 [IU] via SUBCUTANEOUS
  Filled 2020-12-01 (×2): qty 1

## 2020-12-01 MED ORDER — FERRIC CITRATE 1 GM 210 MG(FE) PO TABS
420.0000 mg | ORAL_TABLET | Freq: Three times a day (TID) | ORAL | Status: DC
  Administered 2020-12-02: 420 mg via ORAL
  Filled 2020-12-01 (×6): qty 2

## 2020-12-01 MED ORDER — INSULIN GLARGINE 100 UNIT/ML ~~LOC~~ SOLN
12.0000 [IU] | Freq: Every day | SUBCUTANEOUS | Status: DC
  Filled 2020-12-01: qty 0.12

## 2020-12-01 MED ORDER — CINACALCET HCL 30 MG PO TABS
120.0000 mg | ORAL_TABLET | Freq: Every day | ORAL | Status: DC
  Filled 2020-12-01 (×3): qty 4

## 2020-12-01 MED ORDER — INSULIN ASPART 100 UNIT/ML IJ SOLN
0.0000 [IU] | INTRAMUSCULAR | Status: DC
  Administered 2020-12-01: 1 [IU] via SUBCUTANEOUS
  Administered 2020-12-01: 5 [IU] via SUBCUTANEOUS
  Administered 2020-12-02: 1 [IU] via SUBCUTANEOUS
  Administered 2020-12-02: 2 [IU] via SUBCUTANEOUS
  Filled 2020-12-01 (×3): qty 1

## 2020-12-01 NOTE — ED Notes (Signed)
fsbs 95  Pt sleeping pt waiting on admission bed.

## 2020-12-01 NOTE — ED Notes (Signed)
Pt pulled out iv.  Iv restarted in right hand.

## 2020-12-01 NOTE — ED Notes (Addendum)
Patient CGB 385. Waiting on pharmacy to verify insulin.

## 2020-12-01 NOTE — Consult Note (Signed)
[facility]  CONSULT NOTE    Date: 12/01/2020                  Patient Name:  Raymond Lamb  MRN: [id number]  DOB: Oct 05, 1985  Age / Sex: 35 y.o., male         PCP: Norina Buzzard, MD                 Service Requesting Consult: [place]                 Reason for Consult: ESRD on dialysis            History of Present Illness: [name] is a 35 y.o.  male with Type 1 dialbetes, CAD, PVD, depression, CVA and ESRD on dialysis who was admitted to [facility] on 11/30/2020 for hypoglycemia  We have been consulted to manage dialysis during his admission. He receives his current treatments in [place] at [facility] on TTS schedules. His last treatment was yesterday where he received a full treatment. He has been a dialysis patient for 6 years, receiving HD for all those years. He has been interested in PD but has not pursued it. Denies nausea, vomiting or diarrhea. Denies shortness of breath, pain and discomfort. He was recently discharged from [facility] for DKA. He lives with his parents, ut was visiting friends when this incident occurred.   Medications: Outpatient medications: Medications Prior to Admission  Medication Sig Dispense Refill Last Dose  . sertraline (ZOLOFT) 100 MG tablet Take 1.5 tablets by mouth in the morning.     Marland Kitchen amLODipine (NORVASC) 10 MG tablet Take 1 tablet by mouth daily.     Marland Kitchen aspirin 81 MG chewable tablet Chew 1 tablet by mouth daily.     Marland Kitchen atorvastatin (LIPITOR) 80 MG tablet Take 1 tablet by mouth every evening.     Lorin Picket 1 GM 210 MG(Fe) tablet Take 420 mg by mouth 3 (three) times daily.     . carvedilol (COREG) 25 MG tablet Take 1.5 tablets by mouth in the morning and at bedtime.     . cinacalcet (SENSIPAR) 60 MG tablet Take 120 mg by mouth daily.     . clopidogrel (PLAVIX) 75 MG tablet Take 1 tablet by mouth daily.     . ergocalciferol (VITAMIN D2) 1.25 MG (50000 UT) capsule Take 1 capsule by mouth once a week.     . gabapentin  (NEURONTIN) 300 MG capsule Take 2 capsules by mouth at bedtime.     . hydrALAZINE (APRESOLINE) 50 MG tablet Take 100 mg by mouth every 8 (eight) hours.     . hydrOXYzine (ATARAX/VISTARIL) 10 MG tablet Take 10 mg by mouth 3 (three) times daily as needed.     Marland Kitchen LANTUS SOLOSTAR 100 UNIT/ML Solostar Pen Inject 0.25 Units into the skin at bedtime.     . nitroGLYCERIN (NITROSTAT) 0.4 MG SL tablet Place 1 tablet under the tongue daily as needed.     Marland Kitchen NOVOLOG FLEXPEN 100 UNIT/ML FlexPen Inject 10 Units into the skin 3 (three) times daily. Use per sliding scale no more then 50 units a day       Current medications: Current Facility-Administered Medications  Medication Dose Route Frequency Provider Last Rate Last Admin  . acetaminophen (TYLENOL) tablet 650 mg  650 mg Oral Q6H PRN Marcelyn Bruins, MD       Or  . acetaminophen (TYLENOL) suppository 650 mg  650 mg Rectal Q6H  PRN Marcelyn Bruins, MD      . amLODipine (NORVASC) tablet 10 mg  10 mg Oral Daily Marcelyn Bruins, MD      . aspirin EC tablet 81 mg  81 mg Oral Daily Marcelyn Bruins, MD      . atorvastatin (LIPITOR) tablet 80 mg  80 mg Oral Daily Marcelyn Bruins, MD      . carvedilol (COREG) tablet 37.5 mg  37.5 mg Oral BID WC Marcelyn Bruins, MD      . clopidogrel (PLAVIX) tablet 75 mg  75 mg Oral Daily Marcelyn Bruins, MD      . dextrose 10 % infusion   Intravenous Continuous Sharion Settler, NP 10 mL/hr at 12/01/20 0417 Rate Change at 12/01/20 0417  . heparin injection 5,000 Units  5,000 Units Subcutaneous Q8H Marcelyn Bruins, MD   5,000 Units at 12/01/20 0017  . hydrALAZINE (APRESOLINE) tablet 100 mg  100 mg Oral Q8H Marcelyn Bruins, MD      . insulin aspart (novoLOG) injection 0-20 Units  0-20 Units Subcutaneous Q4H Sharion Settler, NP   4 Units at 12/01/20 [phone]  . pantoprazole (PROTONIX) EC tablet 40 mg  40 mg Oral Daily Marcelyn Bruins, MD      . polyethylene glycol (MIRALAX / GLYCOLAX) packet 17 g   17 g Oral Daily PRN Marcelyn Bruins, MD      . sodium chloride flush (NS) 0.9 % injection 3 mL  3 mL Intravenous Q12H Marcelyn Bruins, MD   3 mL at 12/01/20 0014      Allergies: Allergies  Allergen Reactions  . Baclofen Itching  . Lisinopril Swelling      Past Medical History: Past Medical History:  Diagnosis Date  . Type 1 diabetes ([place])      Past Surgical History: History reviewed. No pertinent surgical history.   Family History: Family History  Problem Relation Age of Onset  . Diabetes type I Other   . Clotting disorder Neg Hx      Social History: Social History   Socioeconomic History  . Marital status: Single    Spouse name: Not on file  . Number of children: Not on file  . Years of education: Not on file  . Highest education level: Not on file  Occupational History  . Not on file  Tobacco Use  . Smoking status: Former Research scientist (life sciences)  . Smokeless tobacco: Never Used  Substance and Sexual Activity  . Alcohol use: Never  . Drug use: Yes    Types: Marijuana    Comment: rarely  . Sexual activity: Yes  Other Topics Concern  . Not on file  Social History Narrative  . Not on file   Social Determinants of Health   Financial Resource Strain: Not on file  Food Insecurity: Not on file  Transportation Needs: Not on file  Physical Activity: Not on file  Stress: Not on file  Social Connections: Not on file  Intimate Partner Violence: Not on file     Review of Systems: Review of Systems  All other systems reviewed and are negative.   Vital Signs: Blood pressure (!) 163/90, pulse 89, temperature 97.6 F (36.4 C), temperature source Oral, resp. rate 16, SpO2 97 %.  Weight trends: There were no vitals filed for this visit.  Physical Exam: General: NAD, laying in bed  Head: Normocephalic, atraumatic. Moist oral mucosal membranes  Eyes: Anicteric  Lungs:  Clear to auscultation, normal breathing effort  Heart: Regular rate and rhythm  Abdomen:   Soft, nontender  Extremities:  trace peripheral edema.  Neurologic: Nonfocal, moving all four extremities  Skin: No lesions  Access: LL AVF     Lab results: Basic Metabolic Panel: Recent Labs  Lab 11/30/20 2148 12/01/20 0438  NA 136 132*  K 4.1 5.6*  CL 99 96*  CO2 27 22  GLUCOSE 93 401*  BUN 22* 31*  CREATININE 4.78* 5.40*  CALCIUM 8.6* 8.0*  MG 2.0  --     Liver Function Tests: Recent Labs  Lab 11/30/20 2148 12/01/20 0438  AST 19 21  ALT 22 21  ALKPHOS 141* 132*  BILITOT 0.6 1.0  PROT 7.6 6.7  ALBUMIN 3.4* 3.0*   No results for input(s): LIPASE, AMYLASE in the last 168 hours. No results for input(s): AMMONIA in the last 168 hours.  CBC: Recent Labs  Lab 11/30/20 2148 12/01/20 0438  WBC 9.5 12.1*  HGB 8.7* 8.7*  HCT 27.8* 27.0*  MCV 97.5 95.7  PLT 280 298    Cardiac Enzymes: No results for input(s): CKTOTAL, CKMB, CKMBINDEX, TROPONINI in the last 168 hours.  BNP: Invalid input(s): POCBNP  CBG: Recent Labs  Lab 12/01/20 0118 12/01/20 0220 12/01/20 0405 12/01/20 0448 12/01/20 0652  GLUCAP 177* 240* 390* 385* 191*    Microbiology: Results for orders placed or performed during the hospital encounter of 11/30/20  Resp Panel by RT-PCR (Flu A&B, Covid) Nasopharyngeal Swab     Status: None   Collection Time: 11/30/20 11:05 PM   Specimen: Nasopharyngeal Swab; Nasopharyngeal(NP) swabs in vial transport medium  Result Value Ref Range Status   SARS Coronavirus 2 by RT PCR NEGATIVE NEGATIVE Final    Comment: (NOTE) SARS-CoV-2 target nucleic acids are NOT DETECTED.  The SARS-CoV-2 RNA is generally detectable in upper respiratory specimens during the acute phase of infection. The lowest concentration of SARS-CoV-2 viral copies this assay can detect is 138 copies/mL. A negative result does not preclude SARS-Cov-2 infection and should not be used as the sole basis for treatment or other patient management decisions. A negative result may occur with   improper specimen collection/handling, submission of specimen other than nasopharyngeal swab, presence of viral mutation(s) within the areas targeted by this assay, and inadequate number of viral copies(<138 copies/mL). A negative result must be combined with clinical observations, patient history, and epidemiological information. The expected result is Negative.  Fact Sheet for Patients:  EntrepreneurPulse.com.au  Fact Sheet for Healthcare Providers:  IncredibleEmployment.be  This test is no t yet approved or cleared by the [place] FDA and  has been authorized for detection and/or diagnosis of SARS-CoV-2 by FDA under an Emergency Use Authorization (EUA). This EUA will remain  in effect (meaning this test can be used) for the duration of the COVID-19 declaration under Section 564(b)(1) of the Act, 21 U.S.C.section 360bbb-3(b)(1), unless the authorization is terminated  or revoked sooner.       Influenza A by PCR NEGATIVE NEGATIVE Final   Influenza B by PCR NEGATIVE NEGATIVE Final    Comment: (NOTE) The Xpert Xpress SARS-CoV-2/FLU/RSV plus assay is intended as an aid in the diagnosis of influenza from Nasopharyngeal swab specimens and should not be used as a sole basis for treatment. Nasal washings and aspirates are unacceptable for Xpert Xpress SARS-CoV-2/FLU/RSV testing.  Fact Sheet for Patients: EntrepreneurPulse.com.au  Fact Sheet for Healthcare Providers: IncredibleEmployment.be  This test is not yet approved or cleared by the [place] and has been authorized  for detection and/or diagnosis of SARS-CoV-2 by FDA under an Emergency Use Authorization (EUA). This EUA will remain in effect (meaning this test can be used) for the duration of the COVID-19 declaration under Section 564(b)(1) of the Act, 21 U.S.C. section 360bbb-3(b)(1), unless the authorization is terminated  or revoked.  Performed at [facility], [address] [place], [place] [address]     Coagulation Studies: No results for input(s): LABPROT, INR in the last 72 hours.  Urinalysis: No results for input(s): COLORURINE, LABSPEC, PHURINE, GLUCOSEU, HGBUR, BILIRUBINUR, KETONESUR, PROTEINUR, UROBILINOGEN, NITRITE, LEUKOCYTESUR in the last 72 hours.  Invalid input(s): APPERANCEUR    Imaging:  No results found.   Assessment & Plan: Mr. Chaquille Purdum is a 35 y.o.  male with Type 1 dialbetes, CAD, PVD, depression, CVA and ESRD on dialysis , who was admitted to [facility] on 11/30/2020 for Hypoglycemia [E16.2] Unresponsiveness [R41.89] Type 1 diabetes mellitus with hypoglycemia without coma ([place]) [E10.649]  [facility]/TTS/Lt lower AVF  1. 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 Home regimen includes Novolog and lantus. Most recent hemoglobin A1c is 10.5 on 06/2019.  Recently admitted at [facility] for DKA, Glucose >1400 on admission Admitted now for glucose 25 and unresponsiveness D10 Gtt at [facility] Hourly CBG's   2. End Stage Renal Disease on hemodialysis:  Last hemodialysis 11/30/20. Tolerated well.  Next scheduled dialysis is tomorrow. Will maintain outpatient schedule while admitted     LOS: 0   6/8/202210:43 AM

## 2020-12-01 NOTE — Progress Notes (Signed)
PROGRESS NOTE    Raymond Lamb  [id number] DOB: September 22, 1985 DOA: 11/30/2020 PCP: Norina Buzzard, MD    Brief Narrative:  Raymond Lamb is a 35 y.o. male with medical history significant of type 1 diabetes, anemia, ESRD on HD, PVD, history of CVA with left-sided weakness, depression, CAD who presents with hypoglycemia.  6/8-pt had hypoglycemic episode this am , BG 40. Currently he has no complaints.   Consultants:   Diabetic educator  Procedures:   Antimicrobials:       Subjective: Denies n/v/abd pain  Objective: Vitals:   12/01/20 0430 12/01/20 0600 12/01/20 0700 12/01/20 0715  BP: (!) 165/99 (!) 160/94 (!) 161/119   Pulse: 98 (!) 107 (!) 101   Resp: 18 17 (!) 22   Temp:      TempSrc:      SpO2: 100% 95%  99%   No intake or output data in the 24 hours ending 12/01/20 0852 There were no vitals filed for this visit.  Examination:  General exam: Appears calm and comfortable  Respiratory system: Clear to auscultation. Respiratory effort normal. Cardiovascular system: S1 & S2 heard, RRR. No JVD Gastrointestinal system: Abdomen is nondistended, soft and nontender.  Normal bowel sounds heard. Central nervous system: Alert and oriented.  Grossly intact Extremities: No edema Skin: warm dry Psychiatry:  Mood & affect appropriate.     Data Reviewed: I have personally reviewed following labs and imaging studies  CBC: Recent Labs  Lab 11/30/20 2148 12/01/20 0438  WBC 9.5 12.1*  HGB 8.7* 8.7*  HCT 27.8* 27.0*  MCV 97.5 95.7  PLT 280 [id number]   Basic Metabolic Panel: Recent Labs  Lab 11/30/20 2148 12/01/20 0438  NA 136 132*  K 4.1 5.6*  CL 99 96*  CO2 27 22  GLUCOSE 93 401*  BUN 22* 31*  CREATININE 4.78* 5.40*  CALCIUM 8.6* 8.0*  MG 2.0  --    GFR: CrCl cannot be calculated (Unknown ideal weight.). Liver Function Tests: Recent Labs  Lab 11/30/20 2148 12/01/20 0438  AST 19 21  ALT 22 21  ALKPHOS 141* 132*  BILITOT 0.6 1.0  PROT 7.6 6.7   ALBUMIN 3.4* 3.0*   No results for input(s): LIPASE, AMYLASE in the last 168 hours. No results for input(s): AMMONIA in the last 168 hours. Coagulation Profile: No results for input(s): INR, PROTIME in the last 168 hours. Cardiac Enzymes: No results for input(s): CKTOTAL, CKMB, CKMBINDEX, TROPONINI in the last 168 hours. BNP (last 3 results) No results for input(s): PROBNP in the last 8760 hours. HbA1C: No results for input(s): HGBA1C in the last 72 hours. CBG: Recent Labs  Lab 12/01/20 0118 12/01/20 0220 12/01/20 0405 12/01/20 0448 12/01/20 0652  GLUCAP 177* 240* 390* 385* 191*   Lipid Profile: No results for input(s): CHOL, HDL, LDLCALC, TRIG, CHOLHDL, LDLDIRECT in the last 72 hours. Thyroid Function Tests: Recent Labs    11/30/20 2148  TSH 1.799   Anemia Panel: No results for input(s): VITAMINB12, FOLATE, FERRITIN, TIBC, IRON, RETICCTPCT in the last 72 hours. Sepsis Labs: No results for input(s): PROCALCITON, LATICACIDVEN in the last 168 hours.  Recent Results (from the past 240 hour(s))  Resp Panel by RT-PCR (Flu A&B, Covid) Nasopharyngeal Swab     Status: None   Collection Time: 11/30/20 11:05 PM   Specimen: Nasopharyngeal Swab; Nasopharyngeal(NP) swabs in vial transport medium  Result Value Ref Range Status   SARS Coronavirus 2 by RT PCR NEGATIVE NEGATIVE Final    Comment: (NOTE) SARS-CoV-2 target  nucleic acids are NOT DETECTED.  The SARS-CoV-2 RNA is generally detectable in upper respiratory specimens during the acute phase of infection. The lowest concentration of SARS-CoV-2 viral copies this assay can detect is 138 copies/mL. A negative result does not preclude SARS-Cov-2 infection and should not be used as the sole basis for treatment or other patient management decisions. A negative result may occur with  improper specimen collection/handling, submission of specimen other than nasopharyngeal swab, presence of viral mutation(s) within the areas targeted  by this assay, and inadequate number of viral copies(<138 copies/mL). A negative result must be combined with clinical observations, patient history, and epidemiological information. The expected result is Negative.  Fact Sheet for Patients:  EntrepreneurPulse.com.au  Fact Sheet for Healthcare Providers:  IncredibleEmployment.be  This test is no t yet approved or cleared by the [place] FDA and  has been authorized for detection and/or diagnosis of SARS-CoV-2 by FDA under an Emergency Use Authorization (EUA). This EUA will remain  in effect (meaning this test can be used) for the duration of the COVID-19 declaration under Section 564(b)(1) of the Act, 21 U.S.C.section 360bbb-3(b)(1), unless the authorization is terminated  or revoked sooner.       Influenza A by PCR NEGATIVE NEGATIVE Final   Influenza B by PCR NEGATIVE NEGATIVE Final    Comment: (NOTE) The Xpert Xpress SARS-CoV-2/FLU/RSV plus assay is intended as an aid in the diagnosis of influenza from Nasopharyngeal swab specimens and should not be used as a sole basis for treatment. Nasal washings and aspirates are unacceptable for Xpert Xpress SARS-CoV-2/FLU/RSV testing.  Fact Sheet for Patients: EntrepreneurPulse.com.au  Fact Sheet for Healthcare Providers: IncredibleEmployment.be  This test is not yet approved or cleared by the [place] FDA and has been authorized for detection and/or diagnosis of SARS-CoV-2 by FDA under an Emergency Use Authorization (EUA). This EUA will remain in effect (meaning this test can be used) for the duration of the COVID-19 declaration under Section 564(b)(1) of the Act, 21 U.S.C. section 360bbb-3(b)(1), unless the authorization is terminated or revoked.  Performed at [facility], [address], [place], [place] [address]          Radiology Studies: No results found.      Scheduled  Meds: . amLODipine  10 mg Oral Daily  . aspirin EC  81 mg Oral Daily  . atorvastatin  80 mg Oral Daily  . carvedilol  37.5 mg Oral BID WC  . clopidogrel  75 mg Oral Daily  . heparin  5,000 Units Subcutaneous Q8H  . hydrALAZINE  100 mg Oral Q8H  . insulin aspart  0-20 Units Subcutaneous Q4H  . pantoprazole  40 mg Oral Daily  . sodium chloride flush  3 mL Intravenous Q12H   Continuous Infusions: . dextrose 10 mL/hr at 12/01/20 0417    Assessment & Plan:   Principal Problem:   Type 1 diabetes mellitus with hypoglycemia without coma (HCC) Active Problems:   Anemia in chronic kidney disease (CKD)   Depression   ESRD (end stage renal disease) (HCC)   History of CVA (cerebrovascular accident)   PVD (peripheral vascular disease) ([place])   CAD (coronary artery disease)   Hypoglycemia   1 diabetes with hypoglycemia without coma Altered mental status > Patient presenting after being found unconscious with low CBG 25.  Some improvement in alertness with D10 drip and improvement in CBG becoming arousable and able to answer questions, however his glucose subsequently dropped again but has improved again while remaining  on drip. 6/8-was on D10 drip Had low bg this am.  Waiting for his meal to eat Diabetic educator following. Recommended basal lantus to start tonight, but will start at 5unit qhs Change RISS sensitivity protocal   ESRD on HD Tuesday Thursday Saturday Nephrology was notified/consulted, will see pt.  Anemia of chronic kidney disease H/h stable No s/sx of bleed  PVD History of CVA With residual left sided deficits, largely wheelchair bound per report Continue asa, plavix, statin  Hyperkalemia- mild. k 5.6 On HD.   Hypertension CAD > Had type II (demand) ischemia during recent admission, found to have nonobstructive CAD. - Continue aspirin, amlodipine, Coreg, hydralazine  Depression - Holding sertraline since was somnolent   DVT prophylaxis:  heparin Code Status:full Family Communication: none at bedside  Status is: Observation  The patient remains OBS appropriate and will d/c before 2 midnights.  Dispo: The patient is from: Home              Anticipated d/c is to: Home              Patient currently is not medically stable to d/c.   Difficult to place patient No            LOS: 0 days   Time spent: 35 minutes with more than 50% on [place], MD [facility] Pager [phone]  If 7PM-7AM, please contact night-coverage 12/01/2020, 8:52 AM

## 2020-12-01 NOTE — Progress Notes (Addendum)
Inpatient Diabetes Program Recommendations  AACE/ADA: New Consensus Statement on Inpatient Glycemic Control (2015)  Target Ranges:  Prepandial:   less than 140 mg/dL      Peak postprandial:   less than 180 mg/dL (1-2 hours)      Critically ill patients:  140 - 180 mg/dL   Results for Raymond Lamb, Raymond Lamb (MRN [id number]) as of 12/01/2020 06:55  Ref. Range 11/30/2020 21:42 11/30/2020 22:47 12/01/2020 00:12 12/01/2020 01:18 12/01/2020 02:20 12/01/2020 04:05 12/01/2020 04:48 12/01/2020 06:52  Glucose-Capillary Latest Ref Range: 70 - 99 mg/dL 94 51 (L) 95 177 (H) 240 (H) 390 (H) 385 (H)  20 units NOVOLOG '@5'$ :20 191 (H)  4 units NOVOLOG '@8'$ :14   Results for Raymond Lamb, Raymond Lamb (MRN [id number]) as of 12/01/2020 10:56  Ref. Range 12/01/2020 10:46  Glucose-Capillary Latest Ref Range: 70 - 99 mg/dL 40 (LL)   Admit with: Hypoglycemia (suspect pt not eating and taking insulin)  History: Type 1 Diabetes, ESRD, CVA  Home DM Meds: Lantus 25 units QHS       Humalog 10 units TID  Current Orders: Novolog Resistant Correction Scale/ SSI (0-20 units) Q4 hours    MD- Given patient has Type 1 diabetes, he will likely need basal insulin added back to regimen to prevent DKA.  --Please consider starting Lantus 12 units QHS (0.2 units/kg) today--This dose was prescribed for pt by his ENDO back in December 2021  HYPO event at 11am this morning after receiving 20 units Novolog at 5am  --Please reduce Novolog SSI to the 0-9 units scale Q4 hours  --May also need Novolog Meal Coverage once he starts eating   Addendum 10:40am--Spoke w/ pt at bedside.  Pt A&O and able to answer all my questions.  Pt told me he is taking Lantus 18 units QHS + Humalog 7 units TID with meals.  Told me he took the Humalog at home and then didn't eat.  Stated something about his food not arriving on time.  Has Dexcom CGM and it was alarming while I was in the room stating pt's CBG was 47 mg/dl.  Called to NT and fingerstick CBG taken immediately and result  was 40 mg/dl.  RN immediately to room with HYPO treatment for pt.    Discussed with pt that he is at high risk for HYPO given his renal disease (explained why to pt).  Strongly encouraged pt to wait to take his Humalog at home until his food is in front of him and he knows he can eat.  Discussed with pt that his ENDO visit notes from December stated they wanted pt to take less Humalog (only 5 units) if he is eating a smaller meal.  Pt did not seem to know this info and I re-emphasized taking only 5 units Humalog if he only eats a small sized meal.  Also reviewed with pt that his ENDO wants him to take Humalog 2 units for every 50 mg/dl above CBG of 150--Pt stated he knows this but does not use the correction factor very often.  Does not know when his next ENDO appt is.  I strongly encouraged pt to call ENDO office and make a follow up appt.  Pt also gave me permission to update his Mom.  Called pt's Mom Raymond Lamb.  Reviewed all of the above.  Mom emphasized the fact that she thinks pt is taking too much Humalog at home and asked me if there was any way pt could just take the long-acting insulin.  I discussed  with Mom that b/c pt has Type 1 diabetes and does not make any insulin, he needs rapid-acting insulin for elevated CBGs and for food coverage.  Discussed with Mom the danger of basal insulin only for Type 1 diabetes and that pt is at high risk for HYPO given his ESRD.  Strongly encouraged Mom to call ENDO office as well to schedule follow up appt for pt.  Mom did not have nay further questions for me at this time.   "Patient recently admitted to [facility] from 5/23 until 5/28 with DKA.  In that discharge summary they noted that his insulin had previously been managed by his family but he did start managing it himself in the week prior to admission after an argument with family"  Endocrinologist: Dr. Annamaria Boots with [facility] Lamb--Last seen by telemedicine visit 06/22/2020 Was told to take  the following: Decrease Lantus to 12 units QHS Decrease Novolog to 5 units with Regular meals (take 7 units with Large meals and 2 units with Snacks) Take Novolog 2 units for every 50 mg/dl >150  Discharged from [facility] after admission for DKA on 11/20/2020 with the following Rxs: Lantus 25 units QHS Humalog 10 units TID with meals   --Will follow patient during hospitalization--  Raymond Quaker RN, MSN, CDE Diabetes Coordinator Inpatient Glycemic Control Team Team Pager: [phone] (8a-5p)

## 2020-12-01 NOTE — ED Notes (Signed)
fsbs 177

## 2020-12-02 LAB — BASIC METABOLIC PANEL
Anion gap: 13 (ref 5–15)
BUN: 56 mg/dL — ABNORMAL HIGH (ref 6–20)
CO2: 26 mmol/L (ref 22–32)
Calcium: 8.3 mg/dL — ABNORMAL LOW (ref 8.9–10.3)
Chloride: 93 mmol/L — ABNORMAL LOW (ref 98–111)
Creatinine, Ser: 7.59 mg/dL — ABNORMAL HIGH (ref 0.61–1.24)
GFR, Estimated: 9 mL/min — ABNORMAL LOW (ref >60)
Glucose, Bld: 164 mg/dL — ABNORMAL HIGH (ref 70–99)
Potassium: 4.8 mmol/L (ref 3.5–5.1)
Sodium: 132 mmol/L — ABNORMAL LOW (ref 135–145)

## 2020-12-02 LAB — GLUCOSE, CAPILLARY
Glucose-Capillary: 171 mg/dL — ABNORMAL HIGH (ref 70–99)
Glucose-Capillary: 154 mg/dL — ABNORMAL HIGH (ref 70–99)
Glucose-Capillary: 249 mg/dL — ABNORMAL HIGH (ref 70–99)

## 2020-12-02 LAB — HEPATITIS B SURFACE ANTIGEN: Hepatitis B Surface Ag: NONREACTIVE

## 2020-12-02 LAB — HEMOGLOBIN A1C
Hgb A1c MFr Bld: 12.4 % — ABNORMAL HIGH (ref 4.8–5.6)
Mean Plasma Glucose: 309 mg/dL

## 2020-12-02 MED ORDER — SODIUM CHLORIDE 0.9 % IV SOLN: 100.0000 mL | INTRAVENOUS | Status: DC | PRN

## 2020-12-02 MED ORDER — LIDOCAINE-PRILOCAINE 2.5-2.5 % EX CREA
1.0000 "application " | TOPICAL_CREAM | CUTANEOUS | Status: DC | PRN
  Filled 2020-12-02: qty 5

## 2020-12-02 MED ORDER — HEPARIN SODIUM (PORCINE) 1000 UNIT/ML DIALYSIS: 1000.0000 [IU] | INTRAMUSCULAR | Status: DC | PRN

## 2020-12-02 MED ORDER — LIDOCAINE HCL (PF) 1 % IJ SOLN
5.0000 mL | INTRAMUSCULAR | Status: DC | PRN
  Filled 2020-12-02: qty 5

## 2020-12-02 MED ORDER — PENTAFLUOROPROP-TETRAFLUOROETH EX AERO
1.0000 "application " | INHALATION_SPRAY | CUTANEOUS | Status: DC | PRN
  Filled 2020-12-02: qty 30

## 2020-12-02 MED ORDER — NOVOLOG FLEXPEN 100 UNIT/ML ~~LOC~~ SOPN: 5.0000 [IU] | PEN_INJECTOR | Freq: Three times a day (TID) | SUBCUTANEOUS | 11 refills | Status: DC

## 2020-12-02 MED ORDER — ALTEPLASE 2 MG IJ SOLR: 2.0000 mg | Freq: Once | INTRAMUSCULAR | Status: DC | PRN

## 2020-12-02 MED ORDER — LANTUS SOLOSTAR 100 UNIT/ML ~~LOC~~ SOPN
15.0000 [IU] | PEN_INJECTOR | Freq: Every evening | SUBCUTANEOUS | 11 refills | Status: DC
Start: 2020-12-02 — End: 2020-12-17

## 2020-12-02 NOTE — Discharge Summary (Signed)
Raymond Lamb [id number] DOB: Jan 15, 1986 DOA: 11/30/2020  PCP: Norina Buzzard, MD  Admit date: 11/30/2020 Discharge date: 12/02/2020  Admitted From: Home Disposition: Home  Recommendations for Outpatient Follow-up:  Follow up with PCP in 1 week Please obtain BMP/CBC in one week Please follow up endocrinology 1 week     Discharge Condition:Stable CODE STATUS: Full Diet recommendation: Carb modified    Brief/Interim Summary: Per HPI: Raymond Lamb is a 35 y.o. male with medical history significant of type 1 diabetes, anemia, ESRD on HD, PVD, history of CVA with left-sided weakness, depression, CAD who presents with hypoglycemia.  Patient's mother went to check on him and found him to be unresponsive.  She called EMS and when they arrived he was unresponsive with a CBG of 25.  He got at the 10 drip in route which improved his CBG to the 200s and then was around the 90s on arrival.  He had improved somewhat and become responsive with verbal stimulation but still somnolent.  His glucose then dropped back down to 50 and he was put back on the D10 drip and has had improvement of his glucose again. Patient recently admitted to [facility] from 5/23 until 5/28 with DKA among other things.  In that discharge summary they noted that his insulin had previously been managed by his family but he did start managing it himself in the week prior to admission after an argument with family.  And he subsequently had this episode of hyperglycemia that he was admitted for and now coming in with hypoglycemia.  He was discharged from that admission on 25 units nightly, 10 units 3 times daily and sliding scale insulin. He reported taking his usual dose of insulin today.  However he states he did not eat anything all day because he just did not feel hungry.  He also states he completed dialysis today. He denies fevers, chills, chest pain, shortness of breath, abdominal pain, constipation diarrhea, nausea, vomiting. Mother  felt  he uses his short acting insulin too frequently including when he is not eating.  She felt that when he was discharged from [facility] his dose was little too high and that he has had variable blood sugars due to his use of short acting and has had more lows than highs.  Type 1 diabetes with hypoglycemia without coma Altered mental status > Patient presenting after being found unconscious with low CBG 25.  Some improvement in alertness with D10 drip and improvement in CBG becoming arousable and able to answer questions, however his glucose subsequently dropped again but has improved again while remaining on drip. BG levels stabilized on D10 drip, subsequently discontinued He is eating. We discussed not taking his novolog with meals if not eating and cutting Lantus dose to half of home dose. Educated pt on monitoring glucose and if not eating to watch his insulin requirement. Also encouraged him to f/u with his endocrinologist       ESRD on HD Tuesday Thursday Saturday Nephrology was consulted.  Patient had dialysis today   Anemia of chronic kidney disease H&H remained stable   PVD History of CVA With residual left sided deficits, largely wheelchair bound per report Continue asa, plavix, statin   Hyperkalemia- mild. k 5.6 Had HD today K 4/8 prior to HD   Hypertension CAD > Had type II (demand) ischemia during recent admission, found to have nonobstructive CAD. - Continue aspirin, amlodipine, Coreg, hydralazine   Depression Continue home meds  Discharge Diagnoses:  Principal Problem:  Type 1 diabetes mellitus with hypoglycemia without coma (HCC) Active Problems:   Anemia in chronic kidney disease (CKD)   Depression   ESRD (end stage renal disease) (HCC)   History of CVA (cerebrovascular accident)   PVD (peripheral vascular disease) ([place])   CAD (coronary artery disease)   Hypoglycemia    Discharge Instructions  Discharge Instructions     Call MD for:  persistant  nausea and vomiting   Complete by: As directed    Diet - low sodium heart healthy   Complete by: As directed    Diet Carb Modified   Complete by: As directed    Discharge instructions   Complete by: As directed    Please follow-up with primary care in one week F/u with your diabetic doctor in one week Monitor your glucose every 6 hours adjust accordingly For now take Lantus 15 unit daily Take novolog 5 units 3x per day with meals. If you are not eating do not take it.   Increase activity slowly   Complete by: As directed       Allergies as of 12/02/2020       Reactions   Baclofen Itching, Other (See Comments)   Became disoriented, emesis   Iodinated Diagnostic Agents Hives   Lisinopril Swelling   Other Swelling   Oxybenzone    Other reaction(s): Unknown        Medication List     TAKE these medications    amLODipine 10 MG tablet Commonly known as: NORVASC Take 10 mg by mouth daily.   aspirin 81 MG chewable tablet Chew 81 mg by mouth daily.   atorvastatin 80 MG tablet Commonly known as: LIPITOR Take 80 mg by mouth every evening.   Auryxia 1 GM 210 MG(Fe) tablet Generic drug: ferric citrate Take 420 mg by mouth 3 (three) times daily.   carvedilol 25 MG tablet Commonly known as: COREG Take 37.5 mg by mouth in the morning and at bedtime.   cinacalcet 60 MG tablet Commonly known as: SENSIPAR Take 120 mg by mouth daily.   clopidogrel 75 MG tablet Commonly known as: PLAVIX Take 75 mg by mouth daily.   ergocalciferol 1.25 MG (50000 UT) capsule Commonly known as: VITAMIN D2 Take 50,000 Units by mouth once a week.   gabapentin 300 MG capsule Commonly known as: NEURONTIN Take 600 mg by mouth at bedtime.   hydrALAZINE 50 MG tablet Commonly known as: APRESOLINE Take 100 mg by mouth every 8 (eight) hours.   hydrOXYzine 10 MG tablet Commonly known as: ATARAX/VISTARIL Take 10 mg by mouth 3 (three) times daily as needed.   Lantus SoloStar 100 UNIT/ML  Solostar Pen Generic drug: insulin glargine Inject 15 Units into the skin at bedtime. What changed: how much to take   nitroGLYCERIN 0.4 MG SL tablet Commonly known as: NITROSTAT Place 1 tablet under the tongue daily as needed.   NovoLOG FlexPen 100 UNIT/ML FlexPen Generic drug: insulin aspart Inject 5 Units into the skin 3 (three) times daily. Use per sliding scale no more then 50 units a day What changed: how much to take   sertraline 100 MG tablet Commonly known as: ZOLOFT Take 150 mg by mouth in the morning.        Follow-up Information     Norina Buzzard, MD. Schedule an appointment as soon as possible for a visit in 1 week(s).   Specialty: Family Medicine Contact information: [facility] [place] [address] [phone]  Allergies  Allergen Reactions   Baclofen Itching and Other (See Comments)    Became disoriented, emesis   Iodinated Diagnostic Agents Hives   Lisinopril Swelling   Other Swelling   Oxybenzone     Other reaction(s): Unknown    Consultations: Nephrology   Procedures/Studies: No results found.    Subjective: Feels better.  Wants to go home. Eating well  Discharge Exam: Vitals:   12/02/20 1516 12/02/20 1549  BP: (!) 159/94 (!) 158/106  Pulse: (!) 101 98  Resp: 18 16  Temp: 98.9 F (37.2 C) 98.1 F (36.7 C)  SpO2: 95% 98%   Vitals:   12/02/20 1415 12/02/20 1430 12/02/20 1516 12/02/20 1549  BP: (!) 167/89 (!) 168/86 (!) 159/94 (!) 158/106  Pulse: 97 96 (!) 101 98  Resp: (!) '22 19 18 16  '$ Temp:   98.9 F (37.2 C) 98.1 F (36.7 C)  TempSrc:    Oral  SpO2:   95% 98%  Weight:        General: Pt is alert, awake, not in acute distress Cardiovascular: RRR, S1/S2 +, no rubs, no gallops Respiratory: CTA bilaterally, no wheezing, no rhonchi Abdominal: Soft, NT, ND, bowel sounds + Extremities: no edema    The results of significant diagnostics from this  hospitalization (including imaging, microbiology, ancillary and laboratory) are listed below for reference.     Microbiology: Recent Results (from the past 240 hour(s))  Resp Panel by RT-PCR (Flu A&B, Covid) Nasopharyngeal Swab     Status: None   Collection Time: 11/30/20 11:05 PM   Specimen: Nasopharyngeal Swab; Nasopharyngeal(NP) swabs in vial transport medium  Result Value Ref Range Status   SARS Coronavirus 2 by RT PCR NEGATIVE NEGATIVE Final    Comment: (NOTE) SARS-CoV-2 target nucleic acids are NOT DETECTED.  The SARS-CoV-2 RNA is generally detectable in upper respiratory specimens during the acute phase of infection. The lowest concentration of SARS-CoV-2 viral copies this assay can detect is 138 copies/mL. A negative result does not preclude SARS-Cov-2 infection and should not be used as the sole basis for treatment or other patient management decisions. A negative result may occur with  improper specimen collection/handling, submission of specimen other than nasopharyngeal swab, presence of viral mutation(s) within the areas targeted by this assay, and inadequate number of viral copies(<138 copies/mL). A negative result must be combined with clinical observations, patient history, and epidemiological information. The expected result is Negative.  Fact Sheet for Patients:  EntrepreneurPulse.com.au  Fact Sheet for Healthcare Providers:  IncredibleEmployment.be  This test is no t yet approved or cleared by the [place] FDA and  has been authorized for detection and/or diagnosis of SARS-CoV-2 by FDA under an Emergency Use Authorization (EUA). This EUA will remain  in effect (meaning this test can be used) for the duration of the COVID-19 declaration under Section 564(b)(1) of the Act, 21 U.S.C.section 360bbb-3(b)(1), unless the authorization is terminated  or revoked sooner.       Influenza A by PCR NEGATIVE NEGATIVE Final    Influenza B by PCR NEGATIVE NEGATIVE Final    Comment: (NOTE) The Xpert Xpress SARS-CoV-2/FLU/RSV plus assay is intended as an aid in the diagnosis of influenza from Nasopharyngeal swab specimens and should not be used as a sole basis for treatment. Nasal washings and aspirates are unacceptable for Xpert Xpress SARS-CoV-2/FLU/RSV testing.  Fact Sheet for Patients: EntrepreneurPulse.com.au  Fact Sheet for Healthcare Providers: IncredibleEmployment.be  This test is not yet approved or cleared by the [place] FDA  and has been authorized for detection and/or diagnosis of SARS-CoV-2 by FDA under an Emergency Use Authorization (EUA). This EUA will remain in effect (meaning this test can be used) for the duration of the COVID-19 declaration under Section 564(b)(1) of the Act, 21 U.S.C. section 360bbb-3(b)(1), unless the authorization is terminated or revoked.  Performed at [facility], [address] [place], [place] [address]   MRSA PCR Screening     Status: None   Collection Time: 12/01/20  6:38 PM   Specimen: Nasal Mucosa; Nasopharyngeal  Result Value Ref Range Status   MRSA by PCR NEGATIVE NEGATIVE Final    Comment:        The GeneXpert MRSA Assay (FDA approved for NASAL specimens only), is one component of a comprehensive MRSA colonization surveillance program. It is not intended to diagnose MRSA infection nor to guide or monitor treatment for MRSA infections. Performed at [facility], [address] [place], [place] [address]      Labs: BNP (last 3 results) No results for input(s): BNP in the last 8760 hours. Basic Metabolic Panel: Recent Labs  Lab 11/30/20 2148 12/01/20 0438 12/02/20 0539  NA 136 132* 132*  K 4.1 5.6* 4.8  CL 99 96* 93*  CO2 '27 22 26  '$ GLUCOSE 93 401* 164*  BUN 22* 31* 56*  CREATININE 4.78* 5.40* 7.59*  CALCIUM 8.6* 8.0* 8.3*  MG 2.0  --   --    Liver Function Tests: Recent  Labs  Lab 11/30/20 2148 12/01/20 0438  AST 19 21  ALT 22 21  ALKPHOS 141* 132*  BILITOT 0.6 1.0  PROT 7.6 6.7  ALBUMIN 3.4* 3.0*   No results for input(s): LIPASE, AMYLASE in the last 168 hours. No results for input(s): AMMONIA in the last 168 hours. CBC: Recent Labs  Lab 11/30/20 2148 12/01/20 0438  WBC 9.5 12.1*  HGB 8.7* 8.7*  HCT 27.8* 27.0*  MCV 97.5 95.7  PLT 280 298   Cardiac Enzymes: No results for input(s): CKTOTAL, CKMB, CKMBINDEX, TROPONINI in the last 168 hours. BNP: Invalid input(s): POCBNP CBG: Recent Labs  Lab 12/01/20 1951 12/01/20 2315 12/02/20 0439 12/02/20 0753 12/02/20 1550  GLUCAP 194* 146* 171* 154* 249*   D-Dimer No results for input(s): DDIMER in the last 72 hours. Hgb A1c Recent Labs    12/01/20 0438  HGBA1C 12.4*   Lipid Profile No results for input(s): CHOL, HDL, LDLCALC, TRIG, CHOLHDL, LDLDIRECT in the last 72 hours. Thyroid function studies Recent Labs    11/30/20 2148  TSH 1.799   Anemia work up No results for input(s): VITAMINB12, FOLATE, FERRITIN, TIBC, IRON, RETICCTPCT in the last 72 hours. Urinalysis No results found for: COLORURINE, APPEARANCEUR, [place], [place], GLUCOSEU, [place], [place], [place], PROTEINUR, UROBILINOGEN, NITRITE, LEUKOCYTESUR Sepsis Labs Invalid input(s): PROCALCITONIN,  WBC,  LACTICIDVEN Microbiology Recent Results (from the past 240 hour(s))  Resp Panel by RT-PCR (Flu A&B, Covid) Nasopharyngeal Swab     Status: None   Collection Time: 11/30/20 11:05 PM   Specimen: Nasopharyngeal Swab; Nasopharyngeal(NP) swabs in vial transport medium  Result Value Ref Range Status   SARS Coronavirus 2 by RT PCR NEGATIVE NEGATIVE Final    Comment: (NOTE) SARS-CoV-2 target nucleic acids are NOT DETECTED.  The SARS-CoV-2 RNA is generally detectable in upper respiratory specimens during the acute phase of infection. The lowest concentration of SARS-CoV-2 viral copies this assay can detect is 138  copies/mL. A negative result does not preclude SARS-Cov-2 infection and should not be used  as the sole basis for treatment or other patient management decisions. A negative result may occur with  improper specimen collection/handling, submission of specimen other than nasopharyngeal swab, presence of viral mutation(s) within the areas targeted by this assay, and inadequate number of viral copies(<138 copies/mL). A negative result must be combined with clinical observations, patient history, and epidemiological information. The expected result is Negative.  Fact Sheet for Patients:  EntrepreneurPulse.com.au  Fact Sheet for Healthcare Providers:  IncredibleEmployment.be  This test is no t yet approved or cleared by the [place] FDA and  has been authorized for detection and/or diagnosis of SARS-CoV-2 by FDA under an Emergency Use Authorization (EUA). This EUA will remain  in effect (meaning this test can be used) for the duration of the COVID-19 declaration under Section 564(b)(1) of the Act, 21 U.S.C.section 360bbb-3(b)(1), unless the authorization is terminated  or revoked sooner.       Influenza A by PCR NEGATIVE NEGATIVE Final   Influenza B by PCR NEGATIVE NEGATIVE Final    Comment: (NOTE) The Xpert Xpress SARS-CoV-2/FLU/RSV plus assay is intended as an aid in the diagnosis of influenza from Nasopharyngeal swab specimens and should not be used as a sole basis for treatment. Nasal washings and aspirates are unacceptable for Xpert Xpress SARS-CoV-2/FLU/RSV testing.  Fact Sheet for Patients: EntrepreneurPulse.com.au  Fact Sheet for Healthcare Providers: IncredibleEmployment.be  This test is not yet approved or cleared by the [place] FDA and has been authorized for detection and/or diagnosis of SARS-CoV-2 by FDA under an Emergency Use Authorization (EUA). This EUA will remain in effect (meaning  this test can be used) for the duration of the COVID-19 declaration under Section 564(b)(1) of the Act, 21 U.S.C. section 360bbb-3(b)(1), unless the authorization is terminated or revoked.  Performed at [facility], [address] [place], [place] [address]   MRSA PCR Screening     Status: None   Collection Time: 12/01/20  6:38 PM   Specimen: Nasal Mucosa; Nasopharyngeal  Result Value Ref Range Status   MRSA by PCR NEGATIVE NEGATIVE Final    Comment:        The GeneXpert MRSA Assay (FDA approved for NASAL specimens only), is one component of a comprehensive MRSA colonization surveillance program. It is not intended to diagnose MRSA infection nor to guide or monitor treatment for MRSA infections. Performed at [facility], [address], [place], [place] [address]      Time coordinating discharge: Over 30 minutes  SIGNED:   Nolberto Hanlon, MD  [facility] 12/02/2020, 4:08 PM Pager   If 7PM-7AM, please contact night-coverage [email or web address] Password [email or web address]

## 2020-12-02 NOTE — Evaluation (Signed)
Occupational Therapy Evaluation Patient Details Name: Raymond Lamb MRN: [id number] DOB: Mar 19, 1986 Today's Date: 12/02/2020    History of Present Illness Raymond Lamb is a 35 y.o. male with medical history significant of type 1 diabetes, anemia, ESRD on HD, PVD, history of CVA with left-sided weakness, depression, CAD who presents with hypoglycemia   Clinical Impression   Pt seated in recliner chair upon entering the room. Pt very pleasant and agreeable to OT evaluation. Pt reports L UE residual deficits from CVA ~ 6 years ago. Pt reports he lives at home with mother and step father. He transfers himself independently into wheelchair and uses hemiplegic technique to propel self in home. He transfers himself into shower and performs all self care tasks himself. He reports only ambulating with assistance short distances with assistance. He was able to demonstrate that this session as well with min guard and assistance with advancement of RW with pt reporting this as baseline. Pt returning to bed at end of session and with no skilled need for OT intervention. OT to SIGN OFF.     Follow Up Recommendations  No OT follow up    Equipment Recommendations  None recommended by OT;Other (comment) (pt requesting L UE sling)    Recommendations for Other Services       Precautions / Restrictions Precautions Precautions: Fall      Mobility Bed Mobility Overal bed mobility: Independent                  Transfers Overall transfer level: Modified independent Equipment used: None;Rolling walker (2 wheeled)             General transfer comment: stands from recliner chair without assistance. Hemi walker not available and utilized RW with therapist assisting with RW advancement.    Balance Overall balance assessment: Needs assistance Sitting-balance support: Feet supported Sitting balance-Leahy Scale: Normal     Standing balance support: During functional activity Standing  balance-Leahy Scale: Good                             ADL either performed or assessed with clinical judgement   ADL Overall ADL's : At baseline;Modified independent                                       General ADL Comments: Pt using hemiplegic one handed technique at baseline     Vision Patient Visual Report: No change from baseline       Perception     Praxis      Pertinent Vitals/Pain Pain Assessment: No/denies pain     Hand Dominance Right   Extremity/Trunk Assessment Upper Extremity Assessment Upper Extremity Assessment: LUE deficits/detail LUE Deficits / Details: hemiplegia with increased tone secondary to prior CVA ~ 6 years ago   Lower Extremity Assessment Lower Extremity Assessment: Defer to PT evaluation       Communication Communication Communication: No difficulties   Cognition Arousal/Alertness: Awake/alert Behavior During Therapy: WFL for tasks assessed/performed Overall Cognitive Status: Within Functional Limits for tasks assessed                                                Home Living Family/patient expects to be discharged to:: Private  residence Living Arrangements: Parent (lives with mother and step father) Available Help at Discharge: Family;Available PRN/intermittently Type of Home: House Home Access: Level entry     Home Layout: One level     Bathroom Shower/Tub: Chief Strategy Officer: Shower seat;Wheelchair - manual          Prior Functioning/Environment Level of Independence: Independent with assistive device(s)        Comments: Pt reports performing all self care independently. He does not ambulate at home unless family is available to assist. He primarily stand pivots to wheelchair and uses hemi technique to propel self. He does IADL tasks from wheelchair level. Mother drives him to HD.        OT Problem List:        OT Treatment/Interventions:       OT Goals(Current goals can be found in the care plan section) Acute Rehab OT Goals Patient Stated Goal: to go home OT Goal Formulation: With patient Time For Goal Achievement: 12/02/20 Potential to Achieve Goals: Good  OT Frequency:      AM-PAC OT "6 Clicks" Daily Activity     Outcome Measure Help from another person eating meals?: None Help from another person taking care of personal grooming?: None Help from another person toileting, which includes using toliet, bedpan, or urinal?: None Help from another person bathing (including washing, rinsing, drying)?: None Help from another person to put on and taking off regular upper body clothing?: None Help from another person to put on and taking off regular lower body clothing?: None 6 Click Score: 24   End of Session Equipment Utilized During Treatment: Rolling walker Nurse Communication: Mobility status;Other (comment) (secure chat to MD and RN with pt requesting L UE sling.)  Activity Tolerance: Patient tolerated treatment well Patient left: in bed                   Time: [id number] OT Time Calculation (min): 13 min Charges:  OT General Charges $OT Visit: 1 Visit OT Evaluation $OT Eval Low Complexity: 1 Low  Darleen Crocker, MS, OTR/L , CBIS ascom [phone]  12/02/20, 11:06 AM

## 2020-12-02 NOTE — Progress Notes (Signed)
Patient given instructions to keep follow up appointments, when to return for worsening symptoms, Diabetes and medication education given, Tele and IV discontinued, & discharged via wheelchair.

## 2020-12-02 NOTE — Progress Notes (Signed)
PT Cancellation Note  Patient Details Name: Raymond Lamb MRN: [id number] DOB: 07-12-1985   Cancelled Treatment:    Reason Eval/Treat Not Completed: PT screened, no needs identified, will sign off. Patient is at baseline level of functioning. No needs at this time.    Fatmata Legere 12/02/2020, 10:19 AM

## 2020-12-02 NOTE — Progress Notes (Signed)
Hemodialysis patient known at [facility] TTS 6:00am, patient stated that mother transports to treatments. Patient did not state any dialysis concerns. Please contact me with any dialysis placement concerns.   Elvera Bicker Dialysis Coordinator [phone]

## 2020-12-02 NOTE — Progress Notes (Signed)
Pt. tolerated tx without incident, Venous pressure elevated requiring adjustment to needle, pressures within parameters. VS remained stable. Post tx bleeding minimial.

## 2020-12-02 NOTE — Care Management Important Message (Signed)
Important Message  Patient Details  Name: Raymond Lamb MRN: [id number] Date of Birth: September 30, 1985   Medicare Important Message Given:  N/A - LOS <3 / Initial given by admissions  Initial Medicare IM reviewed with patient by S. Geanie Cooley, Patient Access Associate on 12/02/2020 at 9:54am.   Dannette Barbara 12/02/2020, 3:05 PM

## 2020-12-02 NOTE — Progress Notes (Signed)
[facility]  ROUNDING NOTE   Subjective:   [name] is a 35 y.o.  male with Type 1 dialbetes, CAD, PVD, depression, CVA and ESRD on dialysis who was admitted to [facility] on 11/30/2020 for hypoglycemia.  Patient seen sitting at the side of bed Alert and oriented Tolerating meals Denies shortness of breath    Objective:  Vital signs in last 24 hours:  Temp:  [98.1 F (36.7 C)-99 F (37.2 C)] 98.4 F (36.9 C) (06/09 1045) Pulse Rate:  [87-105] 104 (06/09 1330) Resp:  [14-27] 21 (06/09 1330) BP: (119-167)/(68-102) 166/100 (06/09 1330) SpO2:  [97 %-100 %] 100 % (06/09 0838) Weight:  [68.2 kg] 68.2 kg (06/09 0500)  Weight change:  Filed Weights   12/02/20 0500  Weight: 68.2 kg    Intake/Output: I/O last 3 completed shifts: In: 372.1 [P.O.:240; I.V.:132.1] Out: -    Intake/Output this shift:  Total I/O In: 120 [P.O.:120] Out: -   Physical Exam: General: NAD, sitting at bedside  Head: Normocephalic, atraumatic. Moist oral mucosal membranes  Eyes: Anicteric  Lungs:  Clear to auscultation, normal breathing effort  Heart: Regular rate and rhythm  Abdomen:  Soft, nontender,   Extremities:  no peripheral edema.  Neurologic: Nonfocal, moving all four extremities  Skin: No lesions  Access: Lt AVF    Basic Metabolic Panel: Recent Labs  Lab 11/30/20 2148 12/01/20 0438 12/02/20 0539  NA 136 132* 132*  K 4.1 5.6* 4.8  CL 99 96* 93*  CO2 '27 22 26  '$ GLUCOSE 93 401* 164*  BUN 22* 31* 56*  CREATININE 4.78* 5.40* 7.59*  CALCIUM 8.6* 8.0* 8.3*  MG 2.0  --   --     Liver Function Tests: Recent Labs  Lab 11/30/20 2148 12/01/20 0438  AST 19 21  ALT 22 21  ALKPHOS 141* 132*  BILITOT 0.6 1.0  PROT 7.6 6.7  ALBUMIN 3.4* 3.0*   No results for input(s): LIPASE, AMYLASE in the last 168 hours. No results for input(s): AMMONIA in the last 168 hours.  CBC: Recent Labs  Lab 11/30/20 2148 12/01/20 0438  WBC 9.5 12.1*  HGB 8.7* 8.7*  HCT 27.8*  27.0*  MCV 97.5 95.7  PLT 280 298    Cardiac Enzymes: No results for input(s): CKTOTAL, CKMB, CKMBINDEX, TROPONINI in the last 168 hours.  BNP: Invalid input(s): POCBNP  CBG: Recent Labs  Lab 12/01/20 1553 12/01/20 1951 12/01/20 2315 12/02/20 0439 12/02/20 0753  GLUCAP 371* 194* 146* 171* 154*    Microbiology: Results for orders placed or performed during the hospital encounter of 11/30/20  Resp Panel by RT-PCR (Flu A&B, Covid) Nasopharyngeal Swab     Status: None   Collection Time: 11/30/20 11:05 PM   Specimen: Nasopharyngeal Swab; Nasopharyngeal(NP) swabs in vial transport medium  Result Value Ref Range Status   SARS Coronavirus 2 by RT PCR NEGATIVE NEGATIVE Final    Comment: (NOTE) SARS-CoV-2 target nucleic acids are NOT DETECTED.  The SARS-CoV-2 RNA is generally detectable in upper respiratory specimens during the acute phase of infection. The lowest concentration of SARS-CoV-2 viral copies this assay can detect is 138 copies/mL. A negative result does not preclude SARS-Cov-2 infection and should not be used as the sole basis for treatment or other patient management decisions. A negative result may occur with  improper specimen collection/handling, submission of specimen other than nasopharyngeal swab, presence of viral mutation(s) within the areas targeted by this assay, and inadequate number of viral copies(<138 copies/mL). A negative  result must be combined with clinical observations, patient history, and epidemiological information. The expected result is Negative.  Fact Sheet for Patients:  EntrepreneurPulse.com.au  Fact Sheet for Healthcare Providers:  IncredibleEmployment.be  This test is no t yet approved or cleared by the [place] FDA and  has been authorized for detection and/or diagnosis of SARS-CoV-2 by FDA under an Emergency Use Authorization (EUA). This EUA will remain  in effect (meaning this test can be  used) for the duration of the COVID-19 declaration under Section 564(b)(1) of the Act, 21 U.S.C.section 360bbb-3(b)(1), unless the authorization is terminated  or revoked sooner.       Influenza A by PCR NEGATIVE NEGATIVE Final   Influenza B by PCR NEGATIVE NEGATIVE Final    Comment: (NOTE) The Xpert Xpress SARS-CoV-2/FLU/RSV plus assay is intended as an aid in the diagnosis of influenza from Nasopharyngeal swab specimens and should not be used as a sole basis for treatment. Nasal washings and aspirates are unacceptable for Xpert Xpress SARS-CoV-2/FLU/RSV testing.  Fact Sheet for Patients: EntrepreneurPulse.com.au  Fact Sheet for Healthcare Providers: IncredibleEmployment.be  This test is not yet approved or cleared by the [place] FDA and has been authorized for detection and/or diagnosis of SARS-CoV-2 by FDA under an Emergency Use Authorization (EUA). This EUA will remain in effect (meaning this test can be used) for the duration of the COVID-19 declaration under Section 564(b)(1) of the Act, 21 U.S.C. section 360bbb-3(b)(1), unless the authorization is terminated or revoked.  Performed at [facility], [address] [place], [place] [address]   MRSA PCR Screening     Status: None   Collection Time: 12/01/20  6:38 PM   Specimen: Nasal Mucosa; Nasopharyngeal  Result Value Ref Range Status   MRSA by PCR NEGATIVE NEGATIVE Final    Comment:        The GeneXpert MRSA Assay (FDA approved for NASAL specimens only), is one component of a comprehensive MRSA colonization surveillance program. It is not intended to diagnose MRSA infection nor to guide or monitor treatment for MRSA infections. Performed at [facility], [address] [place], [place] [address]     Coagulation Studies: No results for input(s): LABPROT, INR in the last 72 hours.  Urinalysis: No results for input(s): COLORURINE, LABSPEC,  PHURINE, GLUCOSEU, HGBUR, BILIRUBINUR, KETONESUR, PROTEINUR, UROBILINOGEN, NITRITE, LEUKOCYTESUR in the last 72 hours.  Invalid input(s): APPERANCEUR    Imaging: No results found.   Medications:    sodium chloride     sodium chloride      amLODipine  10 mg Oral Daily   aspirin EC  81 mg Oral Daily   atorvastatin  80 mg Oral Daily   carvedilol  37.5 mg Oral BID WC   Chlorhexidine Gluconate Cloth  6 each Topical Q0600   cinacalcet  120 mg Oral Q breakfast   clopidogrel  75 mg Oral Daily   ferric citrate  420 mg Oral TID WC   heparin  5,000 Units Subcutaneous Q8H   hydrALAZINE  100 mg Oral Q8H   insulin aspart  0-6 Units Subcutaneous Q4H   insulin glargine  5 Units Subcutaneous QHS   pantoprazole  40 mg Oral Daily   sodium chloride flush  3 mL Intravenous Q12H   sodium chloride, sodium chloride, acetaminophen **OR** acetaminophen, alteplase, heparin, lidocaine (PF), lidocaine-prilocaine, pentafluoroprop-tetrafluoroeth, polyethylene glycol  Assessment/ Plan:  [name] is a 35 y.o.  male with Type 1 dialbetes, CAD, PVD, depression, CVA and ESRD on dialysis ,  who was admitted to [facility] on 11/30/2020 for Hypoglycemia [E16.2] Unresponsiveness [R41.89] Type 1 diabetes mellitus with hypoglycemia without coma ([place]) [E10.649]    End Stage Renal Disease on hemodialysis: Last hemodialysis 11/30/20. Tolerated well. Next scheduled dialysis is tomorrow. Will maintain outpatient schedule while admitted.   2. 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 Home regimen includes Novolog and lantus. Most recent hemoglobin A1c is 10.5 on 06/2019.  Recently admitted at [facility] for DKA, Glucose >1400 on admission Admitted now for glucose 25 and unresponsiveness Tolerating meals Says he feels like he is back to his baseline   3. .Secondary Hyperparathyroidism: with outpatient labs: phosphorus 1.2, calcium 7.6 on 11/20/20.   Lab Results  Component Value Date    CALCIUM 8.3 (L) 12/02/2020  No binders prescribed. Will monitor Will order phosphorus if remains inpatient tomorrow   LOS: 1 [place] 6/9/20222:19 PM

## 2020-12-03 LAB — HEPATITIS B SURFACE ANTIBODY, QUANTITATIVE: Hep B S AB Quant (Post): 92.1 m[IU]/mL (ref >9.9)

## 2020-12-14 ENCOUNTER — Other Ambulatory Visit: Payer: Self-pay

## 2020-12-14 ENCOUNTER — Inpatient Hospital Stay
Admission: EM | Admit: 2020-12-14 | Discharge: 2020-12-17 | DRG: 313 | Disposition: A | Discharge status: Home / Self Care | Payer: Medicare PPO | Attending: Internal Medicine | Admitting: Internal Medicine

## 2020-12-14 ENCOUNTER — Encounter: Payer: Self-pay | Admitting: Emergency Medicine

## 2020-12-14 ENCOUNTER — Emergency Department: Payer: Medicare PPO

## 2020-12-14 DIAGNOSIS — R0789 Other chest pain: Principal | ICD-10-CM | POA: Diagnosis present

## 2020-12-14 DIAGNOSIS — E782 Mixed hyperlipidemia: Secondary | ICD-10-CM | POA: Diagnosis present

## 2020-12-14 DIAGNOSIS — G8929 Other chronic pain: Secondary | ICD-10-CM | POA: Diagnosis present

## 2020-12-14 DIAGNOSIS — I70203 Unspecified atherosclerosis of native arteries of extremities, bilateral legs: Secondary | ICD-10-CM | POA: Diagnosis present

## 2020-12-14 DIAGNOSIS — I200 Unstable angina: Secondary | ICD-10-CM

## 2020-12-14 DIAGNOSIS — I10 Essential (primary) hypertension: Secondary | ICD-10-CM | POA: Diagnosis present

## 2020-12-14 DIAGNOSIS — E1029 Type 1 diabetes mellitus with other diabetic kidney complication: Secondary | ICD-10-CM | POA: Diagnosis present

## 2020-12-14 DIAGNOSIS — F418 Other specified anxiety disorders: Secondary | ICD-10-CM | POA: Diagnosis not present

## 2020-12-14 DIAGNOSIS — N2581 Secondary hyperparathyroidism of renal origin: Secondary | ICD-10-CM | POA: Diagnosis present

## 2020-12-14 DIAGNOSIS — E1069 Type 1 diabetes mellitus with other specified complication: Secondary | ICD-10-CM

## 2020-12-14 DIAGNOSIS — E785 Hyperlipidemia, unspecified: Secondary | ICD-10-CM | POA: Diagnosis present

## 2020-12-14 DIAGNOSIS — I252 Old myocardial infarction: Secondary | ICD-10-CM

## 2020-12-14 DIAGNOSIS — Z992 Dependence on renal dialysis: Secondary | ICD-10-CM

## 2020-12-14 DIAGNOSIS — R0602 Shortness of breath: Secondary | ICD-10-CM | POA: Diagnosis present

## 2020-12-14 DIAGNOSIS — Z95820 Peripheral vascular angioplasty status with implants and grafts: Secondary | ICD-10-CM

## 2020-12-14 DIAGNOSIS — Z87891 Personal history of nicotine dependence: Secondary | ICD-10-CM

## 2020-12-14 DIAGNOSIS — N186 End stage renal disease: Secondary | ICD-10-CM | POA: Diagnosis not present

## 2020-12-14 DIAGNOSIS — R34 Anuria and oliguria: Secondary | ICD-10-CM | POA: Diagnosis present

## 2020-12-14 DIAGNOSIS — R000 Tachycardia, unspecified: Secondary | ICD-10-CM | POA: Diagnosis not present

## 2020-12-14 DIAGNOSIS — R079 Chest pain, unspecified: Secondary | ICD-10-CM | POA: Diagnosis not present

## 2020-12-14 DIAGNOSIS — E1051 Type 1 diabetes mellitus with diabetic peripheral angiopathy without gangrene: Secondary | ICD-10-CM | POA: Diagnosis present

## 2020-12-14 DIAGNOSIS — E1065 Type 1 diabetes mellitus with hyperglycemia: Secondary | ICD-10-CM | POA: Diagnosis present

## 2020-12-14 DIAGNOSIS — E1010 Type 1 diabetes mellitus with ketoacidosis without coma: Secondary | ICD-10-CM | POA: Diagnosis present

## 2020-12-14 DIAGNOSIS — Z7982 Long term (current) use of aspirin: Secondary | ICD-10-CM

## 2020-12-14 DIAGNOSIS — I120 Hypertensive chronic kidney disease with stage 5 chronic kidney disease or end stage renal disease: Secondary | ICD-10-CM | POA: Diagnosis present

## 2020-12-14 DIAGNOSIS — Z79899 Other long term (current) drug therapy: Secondary | ICD-10-CM

## 2020-12-14 DIAGNOSIS — Z20822 Contact with and (suspected) exposure to covid-19: Secondary | ICD-10-CM | POA: Diagnosis present

## 2020-12-14 DIAGNOSIS — I2510 Atherosclerotic heart disease of native coronary artery without angina pectoris: Secondary | ICD-10-CM | POA: Diagnosis present

## 2020-12-14 DIAGNOSIS — Z8673 Personal history of transient ischemic attack (TIA), and cerebral infarction without residual deficits: Secondary | ICD-10-CM | POA: Diagnosis not present

## 2020-12-14 DIAGNOSIS — Z833 Family history of diabetes mellitus: Secondary | ICD-10-CM

## 2020-12-14 DIAGNOSIS — IMO0002 Reserved for concepts with insufficient information to code with codable children: Secondary | ICD-10-CM | POA: Diagnosis present

## 2020-12-14 DIAGNOSIS — E875 Hyperkalemia: Secondary | ICD-10-CM | POA: Diagnosis not present

## 2020-12-14 DIAGNOSIS — D631 Anemia in chronic kidney disease: Secondary | ICD-10-CM | POA: Diagnosis present

## 2020-12-14 DIAGNOSIS — Z7902 Long term (current) use of antithrombotics/antiplatelets: Secondary | ICD-10-CM

## 2020-12-14 DIAGNOSIS — E10649 Type 1 diabetes mellitus with hypoglycemia without coma: Secondary | ICD-10-CM | POA: Diagnosis not present

## 2020-12-14 DIAGNOSIS — Z794 Long term (current) use of insulin: Secondary | ICD-10-CM

## 2020-12-14 DIAGNOSIS — R112 Nausea with vomiting, unspecified: Secondary | ICD-10-CM | POA: Diagnosis present

## 2020-12-14 DIAGNOSIS — Z888 Allergy status to other drugs, medicaments and biological substances status: Secondary | ICD-10-CM

## 2020-12-14 DIAGNOSIS — E1022 Type 1 diabetes mellitus with diabetic chronic kidney disease: Secondary | ICD-10-CM | POA: Diagnosis present

## 2020-12-14 HISTORY — DX: Cerebral infarction, unspecified: I63.9

## 2020-12-14 HISTORY — DX: Atherosclerotic heart disease of native coronary artery without angina pectoris: I25.10

## 2020-12-14 HISTORY — DX: Mixed hyperlipidemia: E78.2

## 2020-12-14 HISTORY — DX: Peripheral vascular disease, unspecified: I73.9

## 2020-12-14 HISTORY — DX: Tobacco use: Z72.0

## 2020-12-14 HISTORY — DX: Anemia in other chronic diseases classified elsewhere: D63.8

## 2020-12-14 HISTORY — DX: Essential (primary) hypertension: I10

## 2020-12-14 HISTORY — DX: End stage renal disease: N18.6

## 2020-12-14 HISTORY — DX: Personal history of other medical treatment: Z92.89

## 2020-12-14 LAB — RESP PANEL BY RT-PCR (FLU A&B, COVID) ARPGX2
Influenza A by PCR: NEGATIVE
Influenza B by PCR: NEGATIVE
SARS Coronavirus 2 by RT PCR: NEGATIVE

## 2020-12-14 LAB — COMPREHENSIVE METABOLIC PANEL
ALT: 30 U/L (ref 0–44)
AST: 31 U/L (ref 15–41)
Albumin: 3.4 g/dL — ABNORMAL LOW (ref 3.5–5.0)
Alkaline Phosphatase: 131 U/L — ABNORMAL HIGH (ref 38–126)
Anion gap: 15 (ref 5–15)
BUN: 52 mg/dL — ABNORMAL HIGH (ref 6–20)
CO2: 24 mmol/L (ref 22–32)
Calcium: 8.5 mg/dL — ABNORMAL LOW (ref 8.9–10.3)
Chloride: 98 mmol/L (ref 98–111)
Creatinine, Ser: 8.7 mg/dL — ABNORMAL HIGH (ref 0.61–1.24)
GFR, Estimated: 7 mL/min — ABNORMAL LOW (ref >60)
Glucose, Bld: 287 mg/dL — ABNORMAL HIGH (ref 70–99)
Potassium: 7.1 mmol/L (ref 3.5–5.1)
Sodium: 137 mmol/L (ref 135–145)
Total Bilirubin: 1 mg/dL (ref 0.3–1.2)
Total Protein: 7.2 g/dL (ref 6.5–8.1)

## 2020-12-14 LAB — BASIC METABOLIC PANEL
Anion gap: 10 (ref 5–15)
BUN: 52 mg/dL — ABNORMAL HIGH (ref 6–20)
CO2: 30 mmol/L (ref 22–32)
Calcium: 9.3 mg/dL (ref 8.9–10.3)
Chloride: 97 mmol/L — ABNORMAL LOW (ref 98–111)
Creatinine, Ser: 8.94 mg/dL — ABNORMAL HIGH (ref 0.61–1.24)
GFR, Estimated: 7 mL/min — ABNORMAL LOW (ref >60)
Glucose, Bld: 300 mg/dL — ABNORMAL HIGH (ref 70–99)
Potassium: 7 mmol/L (ref 3.5–5.1)
Sodium: 137 mmol/L (ref 135–145)

## 2020-12-14 LAB — CBC WITH DIFFERENTIAL/PLATELET
Abs Immature Granulocytes: 0.02 10*3/uL (ref 0.00–0.07)
Basophils Absolute: 0.1 10*3/uL (ref 0.0–0.1)
Basophils Relative: 1 %
Eosinophils Absolute: 0.4 10*3/uL (ref 0.0–0.5)
Eosinophils Relative: 5 %
HCT: 27.2 % — ABNORMAL LOW (ref 39.0–52.0)
Hemoglobin: 8.5 g/dL — ABNORMAL LOW (ref 13.0–17.0)
Immature Granulocytes: 0 %
Lymphocytes Relative: 13 %
Lymphs Abs: 1 10*3/uL (ref 0.7–4.0)
MCH: 30.2 pg (ref 26.0–34.0)
MCHC: 31.3 g/dL (ref 30.0–36.0)
MCV: 96.8 fL (ref 80.0–100.0)
Monocytes Absolute: 0.5 10*3/uL (ref 0.1–1.0)
Monocytes Relative: 7 %
Neutro Abs: 5.8 10*3/uL (ref 1.7–7.7)
Neutrophils Relative %: 74 %
Platelets: 228 10*3/uL (ref 150–400)
RBC: 2.81 MIL/uL — ABNORMAL LOW (ref 4.22–5.81)
RDW: 15.9 % — ABNORMAL HIGH (ref 11.5–15.5)
WBC: 7.8 10*3/uL (ref 4.0–10.5)
nRBC: 0 % (ref 0.0–0.2)

## 2020-12-14 LAB — CBG MONITORING, ED
Glucose-Capillary: 121 mg/dL — ABNORMAL HIGH (ref 70–99)
Glucose-Capillary: 411 mg/dL — ABNORMAL HIGH (ref 70–99)
Glucose-Capillary: 362 mg/dL — ABNORMAL HIGH (ref 70–99)

## 2020-12-14 LAB — TROPONIN I (HIGH SENSITIVITY)
Troponin I (High Sensitivity): 13 ng/L (ref <18)
Troponin I (High Sensitivity): 14 ng/L (ref <18)
Troponin I (High Sensitivity): 13 ng/L (ref <18)
Troponin I (High Sensitivity): 14 ng/L (ref <18)
Troponin I (High Sensitivity): 13 ng/L (ref <18)

## 2020-12-14 MED ORDER — INSULIN ASPART 100 UNIT/ML IJ SOLN
0.0000 [IU] | Freq: Three times a day (TID) | INTRAMUSCULAR | Status: DC
  Administered 2020-12-15: 3 [IU] via SUBCUTANEOUS
  Administered 2020-12-15: 9 [IU] via SUBCUTANEOUS
  Administered 2020-12-15: 2 [IU] via SUBCUTANEOUS
  Administered 2020-12-16: 7 [IU] via SUBCUTANEOUS
  Administered 2020-12-16: 1 [IU] via SUBCUTANEOUS
  Administered 2020-12-17: 2 [IU] via SUBCUTANEOUS
  Filled 2020-12-14 (×7): qty 1

## 2020-12-14 MED ORDER — INSULIN ASPART 100 UNIT/ML IJ SOLN
0.0000 [IU] | Freq: Every day | INTRAMUSCULAR | Status: DC
  Administered 2020-12-14: 5 [IU] via SUBCUTANEOUS
  Administered 2020-12-15: 2 [IU] via SUBCUTANEOUS
  Administered 2020-12-16: 3 [IU] via SUBCUTANEOUS
  Filled 2020-12-14 (×3): qty 1

## 2020-12-14 MED ORDER — MORPHINE SULFATE (PF) 2 MG/ML IV SOLN
2.0000 mg | INTRAVENOUS | Status: DC | PRN
  Administered 2020-12-14 – 2020-12-15 (×2): 2 mg via INTRAVENOUS
  Filled 2020-12-14 (×2): qty 1

## 2020-12-14 MED ORDER — ISOSORBIDE MONONITRATE ER 30 MG PO TB24
30.0000 mg | ORAL_TABLET | Freq: Every day | ORAL | Status: DC
  Administered 2020-12-15 – 2020-12-17 (×3): 30 mg via ORAL
  Filled 2020-12-14 (×3): qty 1

## 2020-12-14 MED ORDER — HYDROMORPHONE HCL 1 MG/ML IJ SOLN
1.0000 mg | Freq: Once | INTRAMUSCULAR | Status: AC
  Administered 2020-12-14: 1 mg via INTRAVENOUS
  Filled 2020-12-14: qty 1

## 2020-12-14 MED ORDER — HYDRALAZINE HCL 20 MG/ML IJ SOLN: 5.0000 mg | INTRAMUSCULAR | Status: DC | PRN

## 2020-12-14 MED ORDER — INSULIN ASPART 100 UNIT/ML IJ SOLN
10.0000 [IU] | Freq: Once | INTRAMUSCULAR | Status: AC
  Administered 2020-12-14: 10 [IU] via SUBCUTANEOUS

## 2020-12-14 MED ORDER — ONDANSETRON HCL 4 MG/2ML IJ SOLN
4.0000 mg | Freq: Three times a day (TID) | INTRAMUSCULAR | Status: DC | PRN
  Administered 2020-12-15: 4 mg via INTRAVENOUS
  Filled 2020-12-14: qty 2

## 2020-12-14 MED ORDER — NITROGLYCERIN 0.4 MG SL SUBL
0.4000 mg | SUBLINGUAL_TABLET | SUBLINGUAL | Status: DC | PRN
  Administered 2020-12-14 (×2): 0.4 mg via SUBLINGUAL
  Filled 2020-12-14: qty 1

## 2020-12-14 MED ORDER — CALCIUM GLUCONATE 10 % IV SOLN
1.0000 g | Freq: Once | INTRAVENOUS | Status: AC
  Administered 2020-12-14: 1 g via INTRAVENOUS
  Filled 2020-12-14: qty 10

## 2020-12-14 MED ORDER — SODIUM BICARBONATE 8.4 % IV SOLN
50.0000 meq | Freq: Once | INTRAVENOUS | Status: AC
  Administered 2020-12-14: 50 meq via INTRAVENOUS
  Filled 2020-12-14: qty 50

## 2020-12-14 MED ORDER — INSULIN ASPART 100 UNIT/ML IJ SOLN
8.0000 [IU] | Freq: Once | INTRAMUSCULAR | Status: AC
  Administered 2020-12-14: 8 [IU] via SUBCUTANEOUS
  Filled 2020-12-14: qty 1

## 2020-12-14 MED ORDER — DEXTROSE 50 % IV SOLN
25.0000 mL | Freq: Once | INTRAVENOUS | Status: AC
  Administered 2020-12-14: 25 mL via INTRAVENOUS
  Filled 2020-12-14: qty 50

## 2020-12-14 MED ORDER — ACETAMINOPHEN 325 MG PO TABS: 650.0000 mg | ORAL_TABLET | Freq: Four times a day (QID) | ORAL | Status: DC | PRN

## 2020-12-14 MED ORDER — HYDROMORPHONE HCL 1 MG/ML IJ SOLN
INTRAMUSCULAR | Status: AC
  Filled 2020-12-14: qty 1

## 2020-12-14 MED ORDER — ALBUTEROL SULFATE (2.5 MG/3ML) 0.083% IN NEBU: 3.0000 mL | INHALATION_SOLUTION | RESPIRATORY_TRACT | Status: DC | PRN

## 2020-12-14 MED ORDER — PATIROMER SORBITEX CALCIUM 8.4 G PO PACK
8.4000 g | PACK | Freq: Every day | ORAL | Status: DC
  Administered 2020-12-15: 8.4 g via ORAL
  Filled 2020-12-14 (×5): qty 1

## 2020-12-14 NOTE — Hospital Course (Signed)
ESRD-HD (TTS), HTN, HLD, type I DM, stroke, anemia, CAD, PVD, presents with chest pain, negative trop x 4 so far, but still has CP. initially his chest pain subsided, but in the afternoon chest pain comes back.  Potassium 7.1 -->urgent HD by renal.

## 2020-12-14 NOTE — H&P (Addendum)
History and Physical    Raymond Lamb [id number] DOB: Aug 13, 1985 DOA: 12/14/2020  Referring MD/NP/PA:   PCP: Norina Buzzard, MD   Patient coming from:  The patient is coming from home.  At baseline, pt is independent for most of ADL.        Chief Complaint: chest pain  HPI: Raymond Lamb is a 35 y.o. male with medical history significant of ESRD-HD (TTS), HTN, HLD, type I DM, stroke, anemia, CAD, PVD, who presents with chest pain.  Pt states that he was awakened with chest pain at approximately 3 AM.  Patient took nitroglycerin without relief.  The chest pain is located in the left side of chest, pressure-like, nonpleuritic, radiating to the left arm, initially 7 out of 10 severity, currently is minimal.  Associated with mild shortness of breath.  No cough, fever or chills.  Patient denies nausea vomiting, diarrhea or abdominal pain.  No symptoms of UTI.  Patient denies missing dialysis.  ED Course: pt was found to have troponin level 13 --> 14 -->13, negative COVID PCR, potassium 7.1 with T wave peaking on EKG, bicarbonate of 30, creatinine 8.94, BUN 52, temperature normal, blood pressure 164/100, heart rate 93, RR 19, oxygen saturation 99% on room air.  Chest x-ray negative.  Patient is placed on progressive bed for observation.  Dr. Juleen China of renal is consulted for urgent dialysis.  Review of Systems:   General: no fevers, chills, no body weight gain, fatigue HEENT: no blurry vision, hearing changes or sore throat Respiratory: has mild dyspnea, no coughing, wheezing CV: has chest pain, no palpitations GI: no nausea, vomiting, abdominal pain, diarrhea, constipation GU: no dysuria, burning on urination, increased urinary frequency, hematuria  Ext: no leg edema Neuro: no unilateral weakness, numbness, or tingling, no vision change or hearing loss Skin: no rash, no skin tear. MSK: No muscle spasm, no deformity, no limitation of range of movement in spin Heme: No easy bruising.   Travel history: No recent long distant travel.  Allergy:  Allergies  Allergen Reactions   Baclofen Itching and Other (See Comments)    Became disoriented, emesis   Iodinated Diagnostic Agents Hives   Lisinopril Swelling   Other Swelling   Oxybenzone     Other reaction(s): Unknown    Past Medical History:  Diagnosis Date   Type 1 diabetes ([place])     Past Surgical History:  Procedure Laterality Date   AVF Left     Social History:  reports that he has quit smoking. He has never used smokeless tobacco. He reports current drug use. Drug: Marijuana. He reports that he does not drink alcohol.  Family History:  Family History  Problem Relation Age of Onset   Diabetes type I Other    Clotting disorder Neg Hx      Prior to Admission medications   Medication Sig Start Date End Date Taking? Authorizing Provider  amLODipine (NORVASC) 10 MG tablet Take 10 mg by mouth daily. 09/19/20  Yes [provider]  aspirin 81 MG chewable tablet Chew 81 mg by mouth daily. 11/25/20  Yes [provider]  atorvastatin (LIPITOR) 80 MG tablet Take 80 mg by mouth every evening. 09/29/20  Yes [provider]  AURYXIA 1 GM 210 MG(Fe) tablet Take 420 mg by mouth 3 (three) times daily. 10/25/20  Yes [provider]  carvedilol (COREG) 25 MG tablet Take 37.5 mg by mouth in the morning and at bedtime. 11/25/20  Yes [provider]  cinacalcet (SENSIPAR) 60  MG tablet Take 120 mg by mouth daily. 11/08/20  Yes [provider]  clopidogrel (PLAVIX) 75 MG tablet Take 75 mg by mouth daily. 08/30/20  Yes [provider]  ergocalciferol (VITAMIN D2) 1.25 MG (50000 UT) capsule Take 50,000 Units by mouth once a week. 08/11/20  Yes [provider]  gabapentin (NEURONTIN) 300 MG capsule Take 600 mg by mouth at bedtime. 09/28/20  Yes [provider]  hydrALAZINE (APRESOLINE) 50 MG tablet Take 100 mg by mouth every 8 (eight) hours. 08/31/20  Yes [provider]  hydrOXYzine (ATARAX/VISTARIL) 10 MG tablet Take 10 mg by mouth 3 (three) times daily as needed. 08/07/20  Yes [provider]  LANTUS SOLOSTAR 100 UNIT/ML Solostar Pen Inject 15 Units into the skin at bedtime. Patient taking differently: Inject 18 Units into the skin at bedtime. 12/02/20  Yes Nolberto Hanlon, MD  nitroGLYCERIN (NITROSTAT) 0.4 MG SL tablet Place 1 tablet under the tongue daily as needed. 11/20/20  Yes [provider]  NOVOLOG FLEXPEN 100 UNIT/ML FlexPen Inject 5 Units into the skin 3 (three) times daily. Use per sliding scale no more then 50 units a day 12/02/20  Yes Nolberto Hanlon, MD  sertraline (ZOLOFT) 100 MG tablet Take 150 mg by mouth in the morning. 11/25/20  Yes [provider]    Physical Exam: Vitals:   12/14/20 0600 12/14/20 0630 12/14/20 0700 12/14/20 0800  BP: (!) 169/85 (!) 167/89 (!) 146/81 135/79  Pulse: 89 93 92 87  Resp: '14 17 19 13  '$ Temp:      TempSrc:      SpO2: 100% 100% 100% 99%  Weight:      Height:       General: Not in acute distress HEENT:       Eyes: PERRL, EOMI, no scleral icterus.       ENT: No discharge from the ears and nose, no pharynx injection, no tonsillar enlargement.        Neck: No JVD, no bruit, no mass felt. Heme: No neck lymph node enlargement. Cardiac: S1/S2, RRR, No murmurs, No gallops or rubs. Respiratory: No rales, wheezing, rhonchi or rubs. GI: Soft, nondistended, nontender, no rebound pain, no organomegaly, BS present. GU: No hematuria Ext: No pitting leg edema bilaterally. 1+DP/PT pulse bilaterally. Musculoskeletal: No joint deformities, No joint redness or warmth, no limitation of ROM in spin. Skin: No rashes.  Neuro: Alert, oriented X3, cranial nerves II-XII grossly intact, moves all extremities normally.  Psych: Patient is not psychotic, no suicidal or hemocidal ideation.  Labs on Admission: I have personally reviewed following labs and imaging studies  CBC: Recent Labs  Lab  12/14/20 0431  WBC 7.8  NEUTROABS 5.8  HGB 8.5*  HCT 27.2*  MCV 96.8  PLT [id number]   Basic Metabolic Panel: Recent Labs  Lab 12/14/20 0431 12/14/20 0559  NA 137 137  K 7.1* 7.0*  CL 98 97*  CO2 24 30  GLUCOSE 287* 300*  BUN 52* 52*  CREATININE 8.70* 8.94*  CALCIUM 8.5* 9.3   GFR: Estimated Creatinine Clearance: 11.3 mL/min (A) (by C-G formula based on SCr of 8.94 mg/dL (H)). Liver Function Tests: Recent Labs  Lab 12/14/20 0431  AST 31  ALT 30  ALKPHOS 131*  BILITOT 1.0  PROT 7.2  ALBUMIN 3.4*   No results for input(s): LIPASE, AMYLASE in the last 168 hours. No results for input(s): AMMONIA in the last 168 hours. Coagulation Profile: No results for input(s): INR, PROTIME in the  last 168 hours. Cardiac Enzymes: No results for input(s): CKTOTAL, CKMB, CKMBINDEX, TROPONINI in the last 168 hours. BNP (last 3 results) No results for input(s): PROBNP in the last 8760 hours. HbA1C: No results for input(s): HGBA1C in the last 72 hours. CBG: Recent Labs  Lab 12/14/20 0827  GLUCAP 121*   Lipid Profile: No results for input(s): CHOL, HDL, LDLCALC, TRIG, CHOLHDL, LDLDIRECT in the last 72 hours. Thyroid Function Tests: No results for input(s): TSH, T4TOTAL, FREET4, T3FREE, THYROIDAB in the last 72 hours. Anemia Panel: No results for input(s): VITAMINB12, FOLATE, FERRITIN, TIBC, IRON, RETICCTPCT in the last 72 hours. Urine analysis: No results found for: COLORURINE, APPEARANCEUR, LABSPEC, PHURINE, GLUCOSEU, HGBUR, BILIRUBINUR, KETONESUR, PROTEINUR, UROBILINOGEN, NITRITE, LEUKOCYTESUR Sepsis Labs: '@LABRCNTIP'$ (procalcitonin:4,lacticidven:4) ) Recent Results (from the past 240 hour(s))  Resp Panel by RT-PCR (Flu A&B, Covid) Nasopharyngeal Swab     Status: None   Collection Time: 12/14/20  5:59 AM   Specimen: Nasopharyngeal Swab; Nasopharyngeal(NP) swabs in vial transport medium  Result Value Ref Range Status   SARS Coronavirus 2 by RT PCR NEGATIVE NEGATIVE Final     Comment: (NOTE) SARS-CoV-2 target nucleic acids are NOT DETECTED.  The SARS-CoV-2 RNA is generally detectable in upper respiratory specimens during the acute phase of infection. The lowest concentration of SARS-CoV-2 viral copies this assay can detect is 138 copies/mL. A negative result does not preclude SARS-Cov-2 infection and should not be used as the sole basis for treatment or other patient management decisions. A negative result may occur with  improper specimen collection/handling, submission of specimen other than nasopharyngeal swab, presence of viral mutation(s) within the areas targeted by this assay, and inadequate number of viral copies(<138 copies/mL). A negative result must be combined with clinical observations, patient history, and epidemiological information. The expected result is Negative.  Fact Sheet for Patients:  EntrepreneurPulse.com.au  Fact Sheet for Healthcare Providers:  IncredibleEmployment.be  This test is no t yet approved or cleared by the [place] FDA and  has been authorized for detection and/or diagnosis of SARS-CoV-2 by FDA under an Emergency Use Authorization (EUA). This EUA will remain  in effect (meaning this test can be used) for the duration of the COVID-19 declaration under Section 564(b)(1) of the Act, 21 U.S.C.section 360bbb-3(b)(1), unless the authorization is terminated  or revoked sooner.       Influenza A by PCR NEGATIVE NEGATIVE Final   Influenza B by PCR NEGATIVE NEGATIVE Final    Comment: (NOTE) The Xpert Xpress SARS-CoV-2/FLU/RSV plus assay is intended as an aid in the diagnosis of influenza from Nasopharyngeal swab specimens and should not be used as a sole basis for treatment. Nasal washings and aspirates are unacceptable for Xpert Xpress SARS-CoV-2/FLU/RSV testing.  Fact Sheet for Patients: EntrepreneurPulse.com.au  Fact Sheet for Healthcare  Providers: IncredibleEmployment.be  This test is not yet approved or cleared by the [place] FDA and has been authorized for detection and/or diagnosis of SARS-CoV-2 by FDA under an Emergency Use Authorization (EUA). This EUA will remain in effect (meaning this test can be used) for the duration of the COVID-19 declaration under Section 564(b)(1) of the Act, 21 U.S.C. section 360bbb-3(b)(1), unless the authorization is terminated or revoked.  Performed at [facility], [address] [place], [place] [address]      Radiological Exams on Admission: DG Chest 1 View  Result Date: 12/14/2020 CLINICAL DATA:  EMS brings pt in for c/o CP; pt reports awoke PTA with mid CP radiating into left arm unrelieved by  NTG; hx MI AND CVA; denies any accomp symptoms EXAM: CHEST  1 VIEW COMPARISON:  Chest x-ray 10/28/2020 FINDINGS: The heart size and mediastinal contours are unchanged. No focal consolidation. No pulmonary edema. No pleural effusion. No pneumothorax. No acute osseous abnormality. IMPRESSION: No active disease. Electronically Signed   By: Iven Finn M.D.   On: 12/14/2020 04:54     EKG: I have personally reviewed.  Sinus rhythm, QTC 452, T wave peaking in, LAD, poor R wave progression  Assessment/Plan Principal Problem:   Chest pain Active Problems:   Anemia in ESRD (end-stage renal disease) ([place])   Depression with anxiety   ESRD (end stage renal disease) ([place])   History of CVA (cerebrovascular accident)   CAD (coronary artery disease)   Type I diabetes mellitus with renal manifestations, uncontrolled ([facility])   Hyperkalemia   HLD (hyperlipidemia)   HTN (hypertension)   Chest pain and hx of CAD: His chest pain is very atypical.  Etiology is not clear.  Troponin negative x3 so far.  His chest pain has subsided, currently very minimal.  Currently no shortness of breath.  No oxygen desaturation.  Chest x-ray negative.  May be due to demand ischemia.  Patient had A1c 12.4 on 12/01/2020  - place to progressive unit for observation - Trend Trop - Repeat EKG in the am  - prn Nitroglycerin, Morphine, and aspirin, lipitor, Plavix - Risk factor stratification: will check FLP   - check UDS - consult card, Dr.    Hyperkalemia and ESRD (end stage renal disease): Potassium 7.1 with T wave peaking -Patient was treated with D50, NovoLog 8 unit, 50 mEq of sodium bicarbonate and was given 8.4 g of patiromer in ED. -Dr. Juleen China of renal is consulted for urgent dialysis  Anemia in ESRD (end-stage renal disease) ([place]) -Continue Auryxia  Depression with anxiety -Continue home medications  History of CVA (cerebrovascular accident) -Lipitor, aspirin, Plavix  Type I diabetes mellitus with renal manifestations, uncontrolled ([place]): Recent A1c 12.4, poorly controlled.  Patient is taking NovoLog and Lantus -Decrease Lantus dose from 18 to 13 unit daily -Sliding scale insulin  HLD (hyperlipidemia) -Lipitor  HTN (hypertension) -IV hydralazine as needed -Coreg, amlodipine, hydralazine,     DVT ppx: SQ Heparin     Code Status: Full code Family Communication: not done, no family member is at bed side.    Disposition Plan:  Anticipate discharge back to previous environment Consults called: Dr. Juleen China of renal Admission status and Level of care: Progressive Cardiac:   for obs      Status is: Observation  The patient remains OBS appropriate and will d/c before 2 midnights.  Dispo: The patient is from: Home              Anticipated d/c is to: Home              Patient currently is not medically stable to d/c.   Difficult to place patient No          Date of Service 12/14/2020    [place] [facility]   If 7PM-7AM, please contact night-coverage [email or web address] 12/14/2020, 10:00 AM

## 2020-12-14 NOTE — ED Provider Notes (Signed)
[facility] Emergency Department Provider Note   ____________________________________________   Event Date/Time   First MD Initiated Contact with Patient 12/14/20 0543     (approximate)  I have reviewed the triage vital signs and the nursing notes.   HISTORY  Chief Complaint Chest Pain    HPI Raymond Lamb is a 35 y.o. male who presents to the ED from home with a chief complaint of chest pain.  Patient has a history of type 1 diabetes, CAD, CVA, PVD, ESRD on HD T/TH/SAT.  Denies missing dialysis.  Recent hospitalization for hypoglycemia.  Awakened with left-sided chest pain approximately 3 AM.  Symptoms not associated with diaphoresis, shortness of breath, nausea/vomiting or dizziness.  Chest pain unrelieved by nitroglycerin at home but currently patient reports chest pain is mostly resolved.  Denies fever, cough, abdominal pain, diarrhea.  Patient is anuric.     Past Medical History:  Diagnosis Date   Type 1 diabetes [facility])     Patient Active Problem List   Diagnosis Date Noted   Chest pain 12/14/2020   Hypoglycemia 12/01/2020   CAD (coronary artery disease) 11/16/2020   Depression 06/22/2016   History of CVA (cerebrovascular accident) 06/22/2016   ESRD (end stage renal disease) ([place]) 12/04/2015   PVD (peripheral vascular disease) ([place]) 12/04/2015   Anemia in chronic kidney disease (CKD) 03/22/2015   Type 1 diabetes mellitus with hypoglycemia without coma ([place]) 03/21/2015    History reviewed. No pertinent surgical history.  Prior to Admission medications   Medication Sig Start Date End Date Taking? Authorizing Provider  amLODipine (NORVASC) 10 MG tablet Take 10 mg by mouth daily. 09/19/20  Yes [provider]  aspirin 81 MG chewable tablet Chew 81 mg by mouth daily. 11/25/20  Yes [provider]  atorvastatin (LIPITOR) 80 MG tablet Take 80 mg by mouth every evening. 09/29/20  Yes [provider]  AURYXIA 1 GM 210  MG(Fe) tablet Take 420 mg by mouth 3 (three) times daily. 10/25/20  Yes [provider]  carvedilol (COREG) 25 MG tablet Take 37.5 mg by mouth in the morning and at bedtime. 11/25/20  Yes [provider]  cinacalcet (SENSIPAR) 60 MG tablet Take 120 mg by mouth daily. 11/08/20  Yes [provider]  clopidogrel (PLAVIX) 75 MG tablet Take 75 mg by mouth daily. 08/30/20  Yes [provider]  ergocalciferol (VITAMIN D2) 1.25 MG (50000 UT) capsule Take 50,000 Units by mouth once a week. 08/11/20  Yes [provider]  gabapentin (NEURONTIN) 300 MG capsule Take 600 mg by mouth at bedtime. 09/28/20  Yes [provider]  hydrALAZINE (APRESOLINE) 50 MG tablet Take 100 mg by mouth every 8 (eight) hours. 08/31/20  Yes [provider]  hydrOXYzine (ATARAX/VISTARIL) 10 MG tablet Take 10 mg by mouth 3 (three) times daily as needed. 08/07/20  Yes [provider]  LANTUS SOLOSTAR 100 UNIT/ML Solostar Pen Inject 15 Units into the skin at bedtime. Patient taking differently: Inject 18 Units into the skin at bedtime. 12/02/20  Yes Nolberto Hanlon, MD  nitroGLYCERIN (NITROSTAT) 0.4 MG SL tablet Place 1 tablet under the tongue daily as needed. 11/20/20  Yes [provider]  NOVOLOG FLEXPEN 100 UNIT/ML FlexPen Inject 5 Units into the skin 3 (three) times daily. Use per sliding scale no more then 50 units a day 12/02/20  Yes Nolberto Hanlon, MD  sertraline (ZOLOFT) 100 MG tablet Take 150 mg by mouth in the morning. 11/25/20  Yes [provider]  Allergies Baclofen, Iodinated diagnostic agents, Lisinopril, Other, and Oxybenzone  Family History  Problem Relation Age of Onset   Diabetes type I Other    Clotting disorder Neg Hx     Social History Social History   Tobacco Use   Smoking status: Former    Pack years: 0.00   Smokeless tobacco: Never  Vaping Use   Vaping Use: Never used  Substance Use Topics   Alcohol use: Never   Drug use: Yes     Types: Marijuana    Comment: rarely    Review of Systems  Constitutional: No fever/chills Eyes: No visual changes. ENT: No sore throat. Cardiovascular: Positive for chest pain. Respiratory: Denies shortness of breath. Gastrointestinal: No abdominal pain.  No nausea, no vomiting.  No diarrhea.  No constipation. Genitourinary: Negative for dysuria. Musculoskeletal: Negative for back pain. Skin: Negative for rash. Neurological: Negative for headaches, focal weakness or numbness.   ____________________________________________   PHYSICAL EXAM:  VITAL SIGNS: ED Triage Vitals  Enc Vitals Group     BP 12/14/20 0427 (!) 169/100     Pulse Rate 12/14/20 0427 93     Resp 12/14/20 0427 18     Temp 12/14/20 0427 97.9 F (36.6 C)     Temp Source 12/14/20 0427 Oral     SpO2 12/14/20 0427 99 %     Weight 12/14/20 0428 160 lb (72.6 kg)     Height 12/14/20 0428 '5\' 9"'$  (1.753 m)     Head Circumference --      Peak Flow --      Pain Score 12/14/20 0428 6     Pain Loc --      Pain Edu? --      Excl. in [place]? --     Constitutional: Alert and oriented. Well appearing and in mild acute distress. Eyes: Conjunctivae are normal. PERRL. EOMI. Head: Atraumatic. Nose: No congestion/rhinnorhea. Mouth/Throat: Mucous membranes are moist.   Neck: No stridor.   Cardiovascular: Normal rate, regular rhythm. Grossly normal heart sounds.  Good peripheral circulation. Respiratory: Normal respiratory effort.  No retractions. Lungs CTAB. Gastrointestinal: Soft and nontender to light or deep palpation. No distention. No abdominal bruits. No CVA tenderness. Musculoskeletal: No lower extremity tenderness nor edema.  No joint effusions. Neurologic:  Normal speech and language. No gross focal neurologic deficits are appreciated.  Skin:  Skin is warm, dry and intact. No rash noted. Psychiatric: Mood and affect are normal. Speech and behavior are normal.  ____________________________________________    LABS (all labs ordered are listed, but only abnormal results are displayed)  Labs Reviewed  CBC WITH DIFFERENTIAL/PLATELET - Abnormal; Notable for the following components:      Result Value   RBC 2.81 (*)    Hemoglobin 8.5 (*)    HCT 27.2 (*)    RDW 15.9 (*)    All other components within normal limits  COMPREHENSIVE METABOLIC PANEL - Abnormal; Notable for the following components:   Potassium 7.1 (*)    Glucose, Bld 287 (*)    BUN 52 (*)    Creatinine, Ser 8.70 (*)    Calcium 8.5 (*)    Albumin 3.4 (*)    Alkaline Phosphatase 131 (*)    GFR, Estimated 7 (*)    All other components within normal limits  RESP PANEL BY RT-PCR (FLU A&B, COVID) ARPGX2  BASIC METABOLIC PANEL  TROPONIN I (HIGH SENSITIVITY)  TROPONIN I (HIGH SENSITIVITY)   ____________________________________________  EKG  ED ECG REPORT I, Kani Jobson J,  the attending physician, personally viewed and interpreted this ECG.   Date: 12/14/2020  EKG Time: 0431  Rate: 88  Rhythm: normal sinus rhythm  Axis: LAD  Intervals:none  ST&T Change: Peaked T waves laterally  ____________________________________________  RADIOLOGY Cecilie Kicks J, personally viewed and evaluated these images (plain radiographs) as part of my medical decision making, as well as reviewing the written report by the radiologist.  ED MD interpretation: No acute cardiopulmonary process  Official radiology report(s): DG Chest 1 View  Result Date: 12/14/2020 CLINICAL DATA:  EMS brings pt in for c/o CP; pt reports awoke PTA with mid CP radiating into left arm unrelieved by NTG; hx MI AND CVA; denies any accomp symptoms EXAM: CHEST  1 VIEW COMPARISON:  Chest x-ray 10/28/2020 FINDINGS: The heart size and mediastinal contours are unchanged. No focal consolidation. No pulmonary edema. No pleural effusion. No pneumothorax. No acute osseous abnormality. IMPRESSION: No active disease. Electronically Signed   By: Iven Finn M.D.   On: 12/14/2020  04:54    ____________________________________________   PROCEDURES  Procedure(s) performed (including Critical Care):  .1-3 Lead EKG Interpretation  Date/Time: 12/14/2020 6:04 AM Performed by: Paulette Blanch, MD Authorized by: Paulette Blanch, MD     Interpretation: normal     ECG rate:  88   ECG rate assessment: normal     Rhythm: sinus rhythm     Ectopy: none     Conduction: normal   Comments:     Patient placed on cardiac monitor to evaluate for arrhythmias   CRITICAL CARE Performed by: Paulette Blanch   Total critical care time: 45 minutes  Critical care time was exclusive of separately billable procedures and treating other patients.  Critical care was necessary to treat or prevent imminent or life-threatening deterioration.  Critical care was time spent personally by me on the following activities: development of treatment plan with patient and/or surrogate as well as nursing, discussions with consultants, evaluation of patient's response to treatment, examination of patient, obtaining history from patient or surrogate, ordering and performing treatments and interventions, ordering and review of laboratory studies, ordering and review of radiographic studies, pulse oximetry and re-evaluation of patient's condition.    ____________________________________________   INITIAL IMPRESSION / ASSESSMENT AND PLAN / ED COURSE  As part of my medical decision making, I reviewed the following data within the [place] notes reviewed and incorporated, Labs reviewed, EKG interpreted, Old chart reviewed, Radiograph reviewed, Discussed with admitting physician, and Notes from prior ED visits     35 year old male with ESRD on HD presenting with chest pain. Differential diagnosis includes, but is not limited to, ACS, aortic dissection, pulmonary embolism, cardiac tamponade, pneumothorax, pneumonia, pericarditis, myocarditis, GI-related causes including  esophagitis/gastritis, and musculoskeletal chest wall pain.     Potassium 7.1 with peaked T waves on EKG.  Will repeat potassium but will initiate hyperkalemia treatment with calcium gluconate, sodium bicarbonate, D50/insulin and Veltassa.  Clinical Course as of 12/14/20 0643  Tue Dec 14, 2020  0559 Discussed with nephrology on-call Dr. Juleen China who will dialyze patient this morning.  Hyperkalemia cocktail ordered.  Will discuss with hospitalist services for admission. [JS]    Clinical Course User Index [JS] Paulette Blanch, MD     ____________________________________________   FINAL CLINICAL IMPRESSION(S) / ED DIAGNOSES  Final diagnoses:  Nonspecific chest pain  Hyperkalemia  ESRD (end stage renal disease) on dialysis [facility])     ED Discharge Orders     None  Note:  This document was prepared using Dragon voice recognition software and may include unintentional dictation errors.    Paulette Blanch, MD 12/14/20 [phone]

## 2020-12-14 NOTE — ED Notes (Signed)
fsbs 362

## 2020-12-14 NOTE — Significant Event (Signed)
Rapid Response Event Note   Reason for Call :   Chest pain Initial Focused Assessment:   Patient stated that he was having 7/10 mid sternal pressure in his chest.  He stated that the reason he came to the hospital was due to chest pain in the middle of the night- he also stated that he had a heart attack about 6 years ago. Vitals stable.    Interventions:  Patient was given 2 nitro sublingual, '2mg'$  of morphine IV and 1 mg dilaudid IV.  Dr. Blaine Hamper was notified.    Plan of Care:  Dr. Blaine Hamper placed a cardiology consult- Ignacia Bayley, NP came to assess the patient. A new set of troponins was being collected by the lab at the time- patient also stated that he pain was being relieved. Ignacia Bayley stated that he would see what the lab work shows. No new orders at this time.   Event Summary:   MD Notified: Dr. Blaine Hamper Call Time:13:27 Arrival Time:13:28 End Time:14:24  Penne Lash, RN

## 2020-12-14 NOTE — Progress Notes (Signed)
Pt. c/o of CP post deaccess of needles, rating pain 7/10 mid chest. d/t cardiac hx, elevated K+ rapid response notified. Pt. responsed favorably to interventions from team, and was transferred safely back to ED for continued observation.No LOC.pain rated 2/10, axox4. Report given to ED nurse.

## 2020-12-14 NOTE — Progress Notes (Signed)
Chaplain Maggie responded to RR code to meet patient in dialysis room 205. Space was made for introducing spiritual care role and then offering a non-anxious presence and prayer. Patient was appreciative of the visit. Continued spiritual care available per on call Chaplain.

## 2020-12-14 NOTE — Consult Note (Signed)
Cardiology Consult    Patient ID: Raymond Lamb MRN: [id number], DOB/AGE: 10/23/1985   Admit date: 12/14/2020 Date of Consult: 12/14/2020  Primary Physician: Norina Buzzard, MD Primary Cardiologist: [facility] Requesting Provider: Mora Bellman, MD  Patient Profile    Raymond Lamb is a 35 y.o. male with a history of nonobstructive CAD, HTN, HL, PVD, stroke, type I DM, ESRD on HD, tobacco abuse, and anemia of chronic dzs, who is being seen today for the evaluation of chest pain at the request of Dr. Blaine Hamper.  Past Medical History   Past Medical History:  Diagnosis Date   Anemia of chronic disease    CAD (coronary artery disease)    a. 11/2015 Cath: LM nl, LAD 30p, LCX nl, OM2 70-80, RCA 50-21m b. 08/2020 MV: EF 56%. No isch/infarct; c. 10/2020 NSTEMI in setting of DKA-->Cath: LM nl, LAD nl, D2 70, LCX nl, OM2 70, OM3 70 inf branch, RCA 80p/m-->Med rx.   ESRD ([place] stage renal disease) ([place]    Essential hypertension    History of echocardiogram    a. 10/2020 Echo: EF >55%, nl RV fxn.   Mixed hyperlipidemia    PVD [place]peripheral vascular disease) [place]    a. 07/2015 PTA/Stenting RSFA; b. 12/2015 LLE Fem->PTA bypass; c. 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 04/2020 Duplex: patent LLE bypass. Mild prox RSFA stenosis. Stable ABIs.   Stroke ([facility]    Tobacco abuse    Type 1 diabetes (H[facility])     Past Surgical History:  Procedure Laterality Date   AVF Left      Allergies  Allergies  Allergen Reactions   Baclofen Itching and Other (See Comments)    Became disoriented, emesis   Iodinated Diagnostic Agents Hives   Lisinopril Swelling   Other Swelling   Oxybenzone     Other reaction(s): Unknown    History of Present Illness    35y/o ? w/ the above complex PMH including CAD, HTN, HL, DMI, ESRD on HD, PVD, CVA, tobacco abuse, and anemia of chronic dzs.  He has a long h/o chest pain, which he says occurs daily, usually @ rest, lasting a few mins, and resolving spontaneously.  He prev underwent  cath in 11/2015 revealing moderate LAD, OM2, and RCA dzs, for which he was medically managed.  [facility]  In the setting of chronic chest pain, he underwent stress testing earlier this year in March, which was low risk.  In May of this year, he was admitted with DKA and non-STEMI with peak troponin of 6343.  He was hypertensive with blood pressures into the 220s during admission.  He was seen by cardiology and underwent echocardiogram which showed normal LV function.  Diagnostic catheterization revealed moderate diffuse CAD with progression of RCA disease to 80% in the proximal and mid sections of the artery.  Medical therapy was recommended.  He was eventually discharged home on aspirin, Plavix, amlodipine, hydralazine, and carvedilol.  Following discharge, patient says that he has continued to have intermittent chest discomfort, similar to what he is accustomed to experiencing, occurring every day, lasting a few minutes, resolving spontaneously.  He rarely uses nitrates.  Earlier this morning, his mother woke him up at about 3 AM.  He says that she was just checking in on him.  He then noted trouble falling back to sleep and began experiencing more severe retrosternal chest discomfort.  This persisted for  about an hour prior to calling EMS.  In route to the ED.  He was treated with nitroglycerin spray with relief.  In the ED, he was noted to be hyperkalemic with a potassium of 7.1.  Chronic stable anemia with an H&H of 8.5 and 27.2.  Troponins were normal.  EKG showed sinus rhythm with peaked T waves, baseline artifact, and subtle upsloping inferior ST depression.  He was treated with D50, insulin, sodium bicarbonate, and  calcium gluconate.  He underwent urgent dialysis this morning and during dialysis, he developed recurrent chest discomfort.  Rapid response was called and he was treated with morphine and nitrates with  improvement in chest pain.  Upon my arrival, he noted mild chest discomfort but after talking with him, symptoms resolved.  Follow-up ECG with 1 mm inferior ST depression, which is more pronounced compared to admission ECG and which was not present on June 7 ECG.  Inpatient Medications     HYDROmorphone       insulin aspart  0-5 Units Subcutaneous QHS   insulin aspart  0-9 Units Subcutaneous TID WC   isosorbide mononitrate  30 mg Oral Daily   patiromer  8.4 g Oral Daily    Family History    Family History  Problem Relation Age of Onset   Diabetes type I Other    Clotting disorder Neg Hx    He indicated that his mother is alive. He indicated that the status of his neg hx is unknown. He indicated that the status of his other is unknown.   Social History    Social History   Socioeconomic History   Marital status: Single    Spouse name: Not on file   Number of children: Not on file   Years of education: Not on file   Highest education level: Not on file  Occupational History   Not on file  Tobacco Use   Smoking status: Every Day    Packs/day: 0.30    Years: 20.00    Pack years: 6.00    Types: Cigarettes   Smokeless tobacco: Never   Tobacco comments:    Currently smoking about six cigarettes/day.  Vaping Use   Vaping Use: Never used  Substance and Sexual Activity   Alcohol use: Never   Drug use: Yes    Types: Marijuana    Comment: rarely   Sexual activity: Yes  Other Topics Concern   Not on file  Social History Narrative   Not on file   Social Determinants of Health   Financial Resource Strain: Not on file  Food Insecurity: Not on file  Transportation Needs: Not on file  Physical Activity: Not on file  Stress: Not on file  Social Connections: Not on file  Intimate Partner Violence: Not on file     Review of Systems    General:  No chills, fever, night sweats or weight changes.  Cardiovascular:  +++ chest pain, no dyspnea on exertion, edema, orthopnea,  palpitations, paroxysmal nocturnal dyspnea. Dermatological: No rash, lesions/masses Respiratory: No cough, dyspnea Urologic: No hematuria, dysuria Abdominal:   No nausea, vomiting, diarrhea, bright red blood per rectum, melena, or hematemesis Neurologic:  No visual changes, wkns, changes in mental status. All other systems reviewed and are otherwise negative except as noted above.  Physical Exam    Blood pressure (!) 156/82, pulse (!) 101, temperature 97.9 F (36.6 C), temperature source Oral, resp. rate 13, height '5\' 9"'$  (1.753 m), weight 72.6 kg, SpO2 99 %.  General: Pleasant, NAD Psych: Normal affect. Neuro: Alert and oriented X 3. Moves all extremities spontaneously. HEENT: Normal  Neck: Supple without bruits or JVD. Lungs:  Resp regular and unlabored, CTA. Heart: RRR no s3, s4, or murmurs. Abdomen: Soft, non-tender, non-distended, BS + x 4.  Extremities: No clubbing, cyanosis or edema. DP/PT 1+, Radials 2+ and equal bilaterally.  Labs    Cardiac Enzymes Recent Labs  Lab 12/14/20 0431 12/14/20 0559 12/14/20 0828 12/14/20 1213 12/14/20 1422  TROPONINIHS '13 14 13 14 13      '$ Lab Results  Component Value Date   WBC 7.8 12/14/2020   HGB 8.5 (L) 12/14/2020   HCT 27.2 (L) 12/14/2020   MCV 96.8 12/14/2020   PLT 228 12/14/2020    Recent Labs  Lab 12/14/20 0431 12/14/20 0559  NA 137 137  K 7.1* 7.0*  CL 98 97*  CO2 24 30  BUN 52* 52*  CREATININE 8.70* 8.94*  CALCIUM 8.5* 9.3  PROT 7.2  --   BILITOT 1.0  --   ALKPHOS 131*  --   ALT 30  --   AST 31  --   GLUCOSE 287* 300*     Radiology Studies    DG Chest 1 View  Result Date: 12/14/2020 CLINICAL DATA:  EMS brings pt in for c/o CP; pt reports awoke PTA with mid CP radiating into left arm unrelieved by NTG; hx MI AND CVA; denies any accomp symptoms EXAM: CHEST  1 VIEW COMPARISON:  Chest x-ray 10/28/2020 FINDINGS: The heart size and mediastinal contours are unchanged. No focal consolidation. No pulmonary edema.  No pleural effusion. No pneumothorax. No acute osseous abnormality. IMPRESSION: No active disease. Electronically Signed   By: Iven Finn M.D.   On: 12/14/2020 04:54    ECG & Cardiac Imaging    Sinus tachycardia, 107, 1 mm flat, inferior ST segment depression- personally reviewed.  Assessment & Plan    1.  Chest pain/coronary artery disease:  Patient has chronic chest pain dating back to 2017, occurring daily, lasting a few minutes, resolving spontaneously.  He had nonobstructive disease on catheterization 2017 and more recently, a negative stress test in March 2022.  He underwent repeat catheterization in May 2022 in the setting of non-STEMI and DKA, at [facility].  He was noted to have progression of proximal to mid RCA disease, now measured at 80%, and recommendation was made for medical therapy.  He had recurrent, more severe chest pain at 3 AM this morning, prompting him to call EMS.  Symptoms resolved after about an hour with sublingual nitrates.  Despite discomfort, troponins were normal in the emergency department.  He was found to be hyperkalemic, which is being managed as outlined below.  During dialysis this morning, he had recurrent chest pain.  ECG with approximately 1 mm of inferior ST segment depression during pain, which has since resolved.  Follow-up troponin again normal.  Patient's symptoms improved with sublingual nitrates and morphine.  He was chest pain-free at the time of my interview.  Continue to cycle enzymes.  Continue home doses of aspirin, statin, beta-blocker, Plavix.  I will add a long-acting nitrate therapy.  We have reached out to [facility] to see if we could obtain films.  Provided the troponins remain normal, would recommend titration of antianginal therapy and outpatient follow-up at [facility].  2.  Hyperkalemia: In the setting of end-stage renal disease.  Patient found to have a potassium of 7.1.  This was initially treated with calcium gluconate, insulin,  D50, and sodium bicarb in  the ED followed by dialysis.  Veltassa has since been ordered.  Management per medicine and nephrology.  3.  Essential hypertension: Blood pressure elevated at 156/82.  Adding nitrate therapy on top of prior home medications.  Follow and titrate medications as necessary.  4.  Hyperlipidemia: Continue statin therapy.  5.  Type 1 diabetes mellitus: Hemoglobin A1c greater than 14 on May 24.  Per internal medicine.  6.  Peripheral arterial disease: Status post prior right SFA stenting and subsequent left lower extremity femoral to popliteal bypass with stable ABIs in November of last year.  Followed by vascular surgery at [facility].  Continue aspirin and statin therapy.  7.  Anemia of chronic disease: Appears to be relatively stable.  Signed, Murray Hodgkins, NP 12/14/2020, 4:22 PM  For questions or updates, please contact   Please consult [email or web address] for contact info under Cardiology/STEMI.

## 2020-12-14 NOTE — ED Triage Notes (Signed)
EMS brings pt in for c/o CP; pt reports awoke PTA with mid CP radiating into left arm unrelieved by NTG; hx MI & CVA; denies any accomp symptoms

## 2020-12-14 NOTE — Progress Notes (Signed)
[facility]  ROUNDING NOTE   Subjective:   [name] was admitted to [facility] on 12/14/2020 for Chest pain [R07.9]  Last hemodialysis treatment was 6/18  Patient woke up with chest pain last night. He states he has never had this kind of chest pain before. Patient went to see his PCP yesterday.      Objective:  Vital signs in last 24 hours:  Temp:  [97.9 F (36.6 C)] 97.9 F (36.6 C) (06/21 0427) Pulse Rate:  [87-93] 87 (06/21 0800) Resp:  [13-19] 13 (06/21 0800) BP: (135-169)/(79-100) 135/79 (06/21 0800) SpO2:  [99 %-100 %] 99 % (06/21 0800) Weight:  [72.6 kg] 72.6 kg (06/21 0428)  Weight change:  Filed Weights   12/14/20 0428  Weight: 72.6 kg    Intake/Output: No intake/output data recorded.   Intake/Output this shift:  No intake/output data recorded.  Physical Exam: General: NAD, laying in bed  Head: Normocephalic, atraumatic. Moist oral mucosal membranes  Eyes: Anicteric  Lungs:  Clear to auscultation, normal breathing effort  Chest Reproducible chest pain with palpation  Heart: Regular rate and rhythm  Abdomen:  Soft, nontender,   Extremities:  no peripheral edema.  Neurologic: Nonfocal, moving all four extremities  Skin: No lesions  Access: Lt AVF    Basic Metabolic Panel: Recent Labs  Lab 12/14/20 0431 12/14/20 0559  NA 137 137  K 7.1* 7.0*  CL 98 97*  CO2 24 30  GLUCOSE 287* 300*  BUN 52* 52*  CREATININE 8.70* 8.94*  CALCIUM 8.5* 9.3     Liver Function Tests: Recent Labs  Lab 12/14/20 0431  AST 31  ALT 30  ALKPHOS 131*  BILITOT 1.0  PROT 7.2  ALBUMIN 3.4*    No results for input(s): LIPASE, AMYLASE in the last 168 hours. No results for input(s): AMMONIA in the last 168 hours.  CBC: Recent Labs  Lab 12/14/20 0431  WBC 7.8  NEUTROABS 5.8  HGB 8.5*  HCT 27.2*  MCV 96.8  PLT 228     Cardiac Enzymes: No results for input(s): CKTOTAL, CKMB, CKMBINDEX, TROPONINI in the last 168 hours.  BNP: Invalid  input(s): POCBNP  CBG: Recent Labs  Lab 12/14/20 0827  GLUCAP 121*     Microbiology: Results for orders placed or performed during the hospital encounter of 12/14/20  Resp Panel by RT-PCR (Flu A&B, Covid) Nasopharyngeal Swab     Status: None   Collection Time: 12/14/20  5:59 AM   Specimen: Nasopharyngeal Swab; Nasopharyngeal(NP) swabs in vial transport medium  Result Value Ref Range Status   SARS Coronavirus 2 by RT PCR NEGATIVE NEGATIVE Final    Comment: (NOTE) SARS-CoV-2 target nucleic acids are NOT DETECTED.  The SARS-CoV-2 RNA is generally detectable in upper respiratory specimens during the acute phase of infection. The lowest concentration of SARS-CoV-2 viral copies this assay can detect is 138 copies/mL. A negative result does not preclude SARS-Cov-2 infection and should not be used as the sole basis for treatment or other patient management decisions. A negative result may occur with  improper specimen collection/handling, submission of specimen other than nasopharyngeal swab, presence of viral mutation(s) within the areas targeted by this assay, and inadequate number of viral copies(<138 copies/mL). A negative result must be combined with clinical observations, patient history, and epidemiological information. The expected result is Negative.  Fact Sheet for Patients:  EntrepreneurPulse.com.au  Fact Sheet for Healthcare Providers:  IncredibleEmployment.be  This test is no t yet approved or cleared by the [place]  States FDA and  has been authorized for detection and/or diagnosis of SARS-CoV-2 by FDA under an Emergency Use Authorization (EUA). This EUA will remain  in effect (meaning this test can be used) for the duration of the COVID-19 declaration under Section 564(b)(1) of the Act, 21 U.S.C.section 360bbb-3(b)(1), unless the authorization is terminated  or revoked sooner.       Influenza A by PCR NEGATIVE NEGATIVE Final    Influenza B by PCR NEGATIVE NEGATIVE Final    Comment: (NOTE) The Xpert Xpress SARS-CoV-2/FLU/RSV plus assay is intended as an aid in the diagnosis of influenza from Nasopharyngeal swab specimens and should not be used as a sole basis for treatment. Nasal washings and aspirates are unacceptable for Xpert Xpress SARS-CoV-2/FLU/RSV testing.  Fact Sheet for Patients: EntrepreneurPulse.com.au  Fact Sheet for Healthcare Providers: IncredibleEmployment.be  This test is not yet approved or cleared by the [place] FDA and has been authorized for detection and/or diagnosis of SARS-CoV-2 by FDA under an Emergency Use Authorization (EUA). This EUA will remain in effect (meaning this test can be used) for the duration of the COVID-19 declaration under Section 564(b)(1) of the Act, 21 U.S.C. section 360bbb-3(b)(1), unless the authorization is terminated or revoked.  Performed at [facility], [address] [place], [place] [address]     Coagulation Studies: No results for input(s): LABPROT, INR in the last 72 hours.  Urinalysis: No results for input(s): COLORURINE, LABSPEC, PHURINE, GLUCOSEU, HGBUR, BILIRUBINUR, KETONESUR, PROTEINUR, UROBILINOGEN, NITRITE, LEUKOCYTESUR in the last 72 hours.  Invalid input(s): APPERANCEUR    Imaging: DG Chest 1 View  Result Date: 12/14/2020 CLINICAL DATA:  EMS brings pt in for c/o CP; pt reports awoke PTA with mid CP radiating into left arm unrelieved by NTG; hx MI AND CVA; denies any accomp symptoms EXAM: CHEST  1 VIEW COMPARISON:  Chest x-ray 10/28/2020 FINDINGS: The heart size and mediastinal contours are unchanged. No focal consolidation. No pulmonary edema. No pleural effusion. No pneumothorax. No acute osseous abnormality. IMPRESSION: No active disease. Electronically Signed   By: Iven Finn M.D.   On: 12/14/2020 04:54     Medications:      insulin aspart  0-5 Units Subcutaneous QHS    insulin aspart  0-9 Units Subcutaneous TID WC   patiromer  8.4 g Oral Daily   acetaminophen, albuterol, hydrALAZINE, morphine injection, ondansetron (ZOFRAN) IV  Assessment/ Plan:  [name] is a 35 y.o.  male with Type 1 dialbetes, CAD, PVD, depression, CVA and ESRD on dialysis , who was [facility] for Chest pain [R07.9]    End Stage Renal Disease on hemodialysis: with hyperkalemia. Hemodialysis for today. Orders prepared. 1K bath for the first hour.  2. 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 Home regimen includes Novolog and lantus. Most recent hemoglobin A1c is 10.5 on 06/2019.  Hemoglobin A1c of 12.4% - poor control.   3. Secondary Hyperparathyroidism: with outpatient labs:  Not currently on binders or vitamin D agent.   4. Hypertension: 135/79. Home regimen of amlodipine, carvedilol, hydralazine.    LOS: 0 Cydne Grahn 6/21/20229:42 AM

## 2020-12-14 NOTE — ED Notes (Signed)
fsbs elevated.  md aware, meds given.

## 2020-12-14 NOTE — ED Notes (Signed)
Pt alert, watching tv.  No chest pain or sob.  nsr on monitor.

## 2020-12-14 NOTE — Progress Notes (Signed)
Hemodialysis patient known at [facility] 6:00am. Mother usually transports. Patient stated that he recently moved and would like to switch clinics. This was discussed and information supplied. Will follow up with mother to supply same information. Please contact me with any dialysis placement concerns.  Elvera Bicker  Dialysis Coordinator [phone]

## 2020-12-14 NOTE — ED Notes (Signed)
Dr end in with pt.

## 2020-12-14 NOTE — ED Notes (Signed)
fsbs 411

## 2020-12-14 NOTE — ED Notes (Signed)
Report given to dialysis nurse.

## 2020-12-15 ENCOUNTER — Encounter: Payer: Self-pay | Admitting: Internal Medicine

## 2020-12-15 ENCOUNTER — Other Ambulatory Visit: Payer: Self-pay

## 2020-12-15 DIAGNOSIS — R072 Precordial pain: Secondary | ICD-10-CM

## 2020-12-15 DIAGNOSIS — I2510 Atherosclerotic heart disease of native coronary artery without angina pectoris: Secondary | ICD-10-CM

## 2020-12-15 DIAGNOSIS — N186 End stage renal disease: Secondary | ICD-10-CM | POA: Diagnosis not present

## 2020-12-15 DIAGNOSIS — I10 Essential (primary) hypertension: Secondary | ICD-10-CM | POA: Diagnosis not present

## 2020-12-15 DIAGNOSIS — E875 Hyperkalemia: Secondary | ICD-10-CM | POA: Diagnosis not present

## 2020-12-15 DIAGNOSIS — D631 Anemia in chronic kidney disease: Secondary | ICD-10-CM | POA: Diagnosis not present

## 2020-12-15 LAB — BASIC METABOLIC PANEL
Anion gap: 12 (ref 5–15)
BUN: 41 mg/dL — ABNORMAL HIGH (ref 6–20)
CO2: 29 mmol/L (ref 22–32)
Calcium: 8.3 mg/dL — ABNORMAL LOW (ref 8.9–10.3)
Chloride: 90 mmol/L — ABNORMAL LOW (ref 98–111)
Creatinine, Ser: 6.98 mg/dL — ABNORMAL HIGH (ref 0.61–1.24)
GFR, Estimated: 10 mL/min — ABNORMAL LOW (ref >60)
Glucose, Bld: 195 mg/dL — ABNORMAL HIGH (ref 70–99)
Potassium: 5.4 mmol/L — ABNORMAL HIGH (ref 3.5–5.1)
Sodium: 131 mmol/L — ABNORMAL LOW (ref 135–145)

## 2020-12-15 LAB — GLUCOSE, RANDOM: Glucose, Bld: 479 mg/dL — ABNORMAL HIGH (ref 70–99)

## 2020-12-15 LAB — LIPID PANEL
Cholesterol: 205 mg/dL — ABNORMAL HIGH (ref 0–200)
HDL: 38 mg/dL — ABNORMAL LOW (ref >40)
LDL Cholesterol: 136 mg/dL — ABNORMAL HIGH (ref 0–99)
Total CHOL/HDL Ratio: 5.4 RATIO
Triglycerides: 155 mg/dL — ABNORMAL HIGH (ref <150)
VLDL: 31 mg/dL (ref 0–40)

## 2020-12-15 LAB — CBG MONITORING, ED
Glucose-Capillary: 421 mg/dL — ABNORMAL HIGH (ref 70–99)
Glucose-Capillary: 185 mg/dL — ABNORMAL HIGH (ref 70–99)
Glucose-Capillary: 220 mg/dL — ABNORMAL HIGH (ref 70–99)
Glucose-Capillary: 231 mg/dL — ABNORMAL HIGH (ref 70–99)

## 2020-12-15 MED ORDER — HYDROXYZINE HCL 10 MG PO TABS
10.0000 mg | ORAL_TABLET | Freq: Three times a day (TID) | ORAL | Status: DC | PRN
  Filled 2020-12-15: qty 1

## 2020-12-15 MED ORDER — CINACALCET HCL 30 MG PO TABS
120.0000 mg | ORAL_TABLET | Freq: Every day | ORAL | Status: DC
  Administered 2020-12-15 – 2020-12-17 (×2): 120 mg via ORAL
  Filled 2020-12-15 (×4): qty 4

## 2020-12-15 MED ORDER — CLOPIDOGREL BISULFATE 75 MG PO TABS
75.0000 mg | ORAL_TABLET | Freq: Every day | ORAL | Status: DC
  Administered 2020-12-15 – 2020-12-17 (×3): 75 mg via ORAL
  Filled 2020-12-15 (×3): qty 1

## 2020-12-15 MED ORDER — FERRIC CITRATE 1 GM 210 MG(FE) PO TABS
420.0000 mg | ORAL_TABLET | Freq: Three times a day (TID) | ORAL | Status: DC
  Administered 2020-12-15 – 2020-12-17 (×5): 420 mg via ORAL
  Filled 2020-12-15 (×10): qty 2

## 2020-12-15 MED ORDER — GABAPENTIN 300 MG PO CAPS
600.0000 mg | ORAL_CAPSULE | Freq: Every day | ORAL | Status: DC
  Administered 2020-12-15 – 2020-12-16 (×2): 600 mg via ORAL
  Filled 2020-12-15 (×2): qty 2

## 2020-12-15 MED ORDER — INSULIN GLARGINE 100 UNIT/ML ~~LOC~~ SOLN
13.0000 [IU] | Freq: Every day | SUBCUTANEOUS | Status: DC
  Administered 2020-12-15 – 2020-12-16 (×2): 13 [IU] via SUBCUTANEOUS
  Filled 2020-12-15 (×3): qty 0.13

## 2020-12-15 MED ORDER — ONDANSETRON 4 MG PO TBDP
4.0000 mg | ORAL_TABLET | Freq: Three times a day (TID) | ORAL | Status: DC | PRN
  Filled 2020-12-15: qty 1

## 2020-12-15 MED ORDER — HYDRALAZINE HCL 50 MG PO TABS
100.0000 mg | ORAL_TABLET | Freq: Three times a day (TID) | ORAL | Status: DC
  Administered 2020-12-15 – 2020-12-17 (×6): 100 mg via ORAL
  Filled 2020-12-15 (×6): qty 2

## 2020-12-15 MED ORDER — ONDANSETRON 4 MG PO TBDP: 4.0000 mg | ORAL_TABLET | Freq: Three times a day (TID) | ORAL | Status: DC | PRN

## 2020-12-15 MED ORDER — INSULIN GLARGINE 100 UNIT/ML SOLOSTAR PEN: 13.0000 [IU] | PEN_INJECTOR | Freq: Every day | SUBCUTANEOUS | Status: DC

## 2020-12-15 MED ORDER — SERTRALINE HCL 50 MG PO TABS
150.0000 mg | ORAL_TABLET | Freq: Every morning | ORAL | Status: DC
  Administered 2020-12-15 – 2020-12-16 (×2): 150 mg via ORAL
  Filled 2020-12-15 (×2): qty 3

## 2020-12-15 MED ORDER — AMLODIPINE BESYLATE 10 MG PO TABS
10.0000 mg | ORAL_TABLET | Freq: Every day | ORAL | Status: DC
  Administered 2020-12-15 – 2020-12-17 (×2): 10 mg via ORAL
  Filled 2020-12-15: qty 2
  Filled 2020-12-15: qty 1

## 2020-12-15 MED ORDER — ATORVASTATIN CALCIUM 80 MG PO TABS
80.0000 mg | ORAL_TABLET | Freq: Every evening | ORAL | Status: DC
  Administered 2020-12-15 – 2020-12-16 (×2): 80 mg via ORAL
  Filled 2020-12-15 (×2): qty 1

## 2020-12-15 MED ORDER — NITROGLYCERIN 0.4 MG SL SUBL: 0.4000 mg | SUBLINGUAL_TABLET | Freq: Every day | SUBLINGUAL | Status: DC | PRN

## 2020-12-15 MED ORDER — ASPIRIN 81 MG PO CHEW
81.0000 mg | CHEWABLE_TABLET | Freq: Every day | ORAL | Status: DC
  Administered 2020-12-15 – 2020-12-17 (×3): 81 mg via ORAL
  Filled 2020-12-15 (×3): qty 1

## 2020-12-15 MED ORDER — CARVEDILOL 25 MG PO TABS
37.5000 mg | ORAL_TABLET | Freq: Two times a day (BID) | ORAL | Status: DC
  Administered 2020-12-15 – 2020-12-17 (×4): 37.5 mg via ORAL
  Filled 2020-12-15: qty 1
  Filled 2020-12-15: qty 6
  Filled 2020-12-15: qty 1
  Filled 2020-12-15: qty 6
  Filled 2020-12-15: qty 1

## 2020-12-15 MED ORDER — HEPARIN SODIUM (PORCINE) 5000 UNIT/ML IJ SOLN
5000.0000 [IU] | Freq: Three times a day (TID) | INTRAMUSCULAR | Status: DC
  Administered 2020-12-15 – 2020-12-17 (×6): 5000 [IU] via SUBCUTANEOUS
  Filled 2020-12-15 (×7): qty 1

## 2020-12-15 NOTE — Progress Notes (Signed)
Inpatient Diabetes Program Recommendations  AACE/ADA: New Consensus Statement on Inpatient Glycemic Control (2015)  Target Ranges:  Prepandial:   less than 140 mg/dL      Peak postprandial:   less than 180 mg/dL (1-2 hours)      Critically ill patients:  140 - 180 mg/dL   Lab Results  Component Value Date   GLUCAP 421 (H) 12/15/2020   HGBA1C 12.4 (H) 12/01/2020    Review of Glycemic Control Results for Raymond Lamb, Raymond Lamb (MRN [id number]) as of 12/15/2020 10:47  Ref. Range 12/02/2020 15:50 12/14/2020 08:27 12/14/2020 17:06 12/14/2020 21:12 12/15/2020 07:51  Glucose-Capillary Latest Ref Range: 70 - 99 mg/dL 249 (H) 121 (H) 411 (H) 362 (H) 421 (H)   Diabetes history: DM1 Outpatient Diabetes medications: Lantus 18 QHS, Novolog 5 TID Current orders for Inpatient glycemic control: Lantus 13 units q hs + Novolog  correction 0-9 units tid  Inpatient Diabetes Program Recommendations:   Lantus 10 units ordered yesterday pm but not showing given on summary.  -Give Lantus now and qd schedule -Add Novolog 3 units tid meal coverage if eats 50% Secure chat sent to Dr. Mal Misty  Thank you, Nani Gasser. Jenavi Beedle, RN, MSN, CDE  Diabetes Coordinator Inpatient Glycemic Control Team Team Pager [phone] (8am-5pm) 12/15/2020 11:17 AM

## 2020-12-15 NOTE — ED Notes (Signed)
Pt sleeping. 

## 2020-12-15 NOTE — Progress Notes (Signed)
Progress Note    Raymond Lamb  [id number] DOB: 1985/09/25  DOA: 12/14/2020 PCP: Norina Buzzard, MD      Brief Narrative:    Medical records reviewed and are as summarized below:  Raymond Lamb is a 35 y.o. male with medical history significant for ESRD on hemodialysis on Tuesdays, Thursdays and Saturdays, hypertension, hyperlipidemia, brittle type 1 diabetes mellitus, stroke, anemia of chronic disease, PVD, CAD s/p heart catheterization in May 2022 which showed progression of proximal to mid RCA disease at 80% (medical therapy was recommended).  He presented to the hospital with chest pain and shortness of breath.  He was admitted to the hospital for observation.  He was found to have hyperkalemia with potassium of 7.1 associated with peaked T waves. Troponins were negative.  Cardiologist was consulted because of multiple cardiac risk factors.   Assessment/Plan:   Principal Problem:   Chest pain Active Problems:   Anemia in ESRD (end-stage renal disease) (HCC)   Depression with anxiety   ESRD (end stage renal disease) ([place])   History of CVA (cerebrovascular accident)   CAD (coronary artery disease)   Type I diabetes mellitus with renal manifestations, uncontrolled (HCC)   Hyperkalemia   HLD (hyperlipidemia)   HTN (hypertension)    Body mass index is 23.63 kg/m.   Chest pain, CAD: Continue aspirin, Plavix and Lipitor.  Cardiologist on board.  Troponin is normal.  No plan for left heart cath.  S/p heart catheter May 2022 at [facility] which showed progression of proximal to mid RCA disease at 80% medical therapy was recommended.  ESRD with hyperkalemia: Hyperkalemia is improving.  Potassium was 7.1 with peaked T waves on admission.  Nausea and vomiting: Antiemetics as needed.  Brittle type 1 diabetes mellitus with hyperglycemia: Glucose was 421 this morning.  Continue Lantus and NovoLog.  Patient has history of hypoglycemic episodes in the past which makes  titration of Lantus difficult.  Monitor glucose levels closely.  Other comorbidities include hypertension, hyperlipidemia, history of stroke, PVD, depression, anxiety, anemia of chronic disease     Diet Order             Diet renal with fluid restriction Fluid restriction: 1200 mL Fluid; Room service appropriate? Yes; Fluid consistency: Thin  Diet effective now                      Consultants: Nephrologist  Procedures: None    Medications:    amLODipine  10 mg Oral Daily   aspirin  81 mg Oral Daily   atorvastatin  80 mg Oral QPM   carvedilol  37.5 mg Oral BID WC   cinacalcet  120 mg Oral Daily   clopidogrel  75 mg Oral Daily   ferric citrate  420 mg Oral TID   gabapentin  600 mg Oral QHS   heparin  5,000 Units Subcutaneous Q8H   hydrALAZINE  100 mg Oral Q8H   insulin aspart  0-5 Units Subcutaneous QHS   insulin aspart  0-9 Units Subcutaneous TID WC   insulin glargine  13 Units Subcutaneous Q2200   isosorbide mononitrate  30 mg Oral Daily   patiromer  8.4 g Oral Daily   sertraline  150 mg Oral q AM   Continuous Infusions:   Anti-infectives (From admission, onward)    None              Family Communication/Anticipated D/C date and plan/Code Status   DVT prophylaxis: heparin injection  5,000 Units Start: 12/15/20 0600     Code Status: Full Code  Family Communication: None Disposition Plan:    Status is: Observation  The patient will require care spanning > 2 midnights and should be moved to inpatient because: IV treatments appropriate due to intensity of illness or inability to take PO and Inpatient level of care appropriate due to severity of illness  Dispo: The patient is from: Home              Anticipated d/c is to: Home              Patient currently is not medically stable to d/c.   Difficult to place patient No           Subjective:   C/o nausea and vomiting. No chest pain or shortness of breath  Objective:     Vitals:   12/15/20 0630 12/15/20 0830 12/15/20 1036 12/15/20 1203  BP: (!) 167/92  (!) 166/82 (!) 143/83  Pulse: 92   94  Resp: 15     Temp:  98.5 F (36.9 C)    TempSrc:  Oral    SpO2: 98% 100%  95%  Weight:      Height:       No data found.   Intake/Output Summary (Last 24 hours) at 12/15/2020 1352 Last data filed at 12/15/2020 1300 Gross per 24 hour  Intake 480 ml  Output --  Net 480 ml   Filed Weights   12/14/20 0428  Weight: 72.6 kg    Exam:  GEN: NAD SKIN: No rash EYES: EOMI ENT: MMM CV: RRR PULM: CTA B ABD: soft, ND, NT, +BS CNS: AAO x 3, non focal EXT: No edema or tenderness        Data Reviewed:   I have personally reviewed following labs and imaging studies:  Labs: Labs show the following:   Basic Metabolic Panel: Recent Labs  Lab 12/14/20 0431 12/14/20 0559 12/15/20 0332 12/15/20 0812  NA 137 137 131*  --   K 7.1* 7.0* 5.4*  --   CL 98 97* 90*  --   CO2 '24 30 29  '$ --   GLUCOSE 287* 300* 195* 479*  BUN 52* 52* 41*  --   CREATININE 8.70* 8.94* 6.98*  --   CALCIUM 8.5* 9.3 8.3*  --    GFR Estimated Creatinine Clearance: 14.5 mL/min (A) (by C-G formula based on SCr of 6.98 mg/dL (H)). Liver Function Tests: Recent Labs  Lab 12/14/20 0431  AST 31  ALT 30  ALKPHOS 131*  BILITOT 1.0  PROT 7.2  ALBUMIN 3.4*   No results for input(s): LIPASE, AMYLASE in the last 168 hours. No results for input(s): AMMONIA in the last 168 hours. Coagulation profile No results for input(s): INR, PROTIME in the last 168 hours.  CBC: Recent Labs  Lab 12/14/20 0431  WBC 7.8  NEUTROABS 5.8  HGB 8.5*  HCT 27.2*  MCV 96.8  PLT 228   Cardiac Enzymes: No results for input(s): CKTOTAL, CKMB, CKMBINDEX, TROPONINI in the last 168 hours. BNP (last 3 results) No results for input(s): PROBNP in the last 8760 hours. CBG: Recent Labs  Lab 12/14/20 0827 12/14/20 1706 12/14/20 2112 12/15/20 0751 12/15/20 1202  GLUCAP 121* 411* 362* 421* 185*    D-Dimer: No results for input(s): DDIMER in the last 72 hours. Hgb A1c: No results for input(s): HGBA1C in the last 72 hours. Lipid Profile: Recent Labs    12/15/20 0332  CHOL  205*  HDL 38*  LDLCALC 136*  TRIG 155*  CHOLHDL 5.4   Thyroid function studies: No results for input(s): TSH, T4TOTAL, T3FREE, THYROIDAB in the last 72 hours.  Invalid input(s): FREET3 Anemia work up: No results for input(s): VITAMINB12, FOLATE, FERRITIN, TIBC, IRON, RETICCTPCT in the last 72 hours. Sepsis Labs: Recent Labs  Lab 12/14/20 0431  WBC 7.8    Microbiology Recent Results (from the past 240 hour(s))  Resp Panel by RT-PCR (Flu A&B, Covid) Nasopharyngeal Swab     Status: None   Collection Time: 12/14/20  5:59 AM   Specimen: Nasopharyngeal Swab; Nasopharyngeal(NP) swabs in vial transport medium  Result Value Ref Range Status   SARS Coronavirus 2 by RT PCR NEGATIVE NEGATIVE Final    Comment: (NOTE) SARS-CoV-2 target nucleic acids are NOT DETECTED.  The SARS-CoV-2 RNA is generally detectable in upper respiratory specimens during the acute phase of infection. The lowest concentration of SARS-CoV-2 viral copies this assay can detect is 138 copies/mL. A negative result does not preclude SARS-Cov-2 infection and should not be used as the sole basis for treatment or other patient management decisions. A negative result may occur with  improper specimen collection/handling, submission of specimen other than nasopharyngeal swab, presence of viral mutation(s) within the areas targeted by this assay, and inadequate number of viral copies(<138 copies/mL). A negative result must be combined with clinical observations, patient history, and epidemiological information. The expected result is Negative.  Fact Sheet for Patients:  EntrepreneurPulse.com.au  Fact Sheet for Healthcare Providers:  IncredibleEmployment.be  This test is no t yet approved or cleared  by the [place] FDA and  has been authorized for detection and/or diagnosis of SARS-CoV-2 by FDA under an Emergency Use Authorization (EUA). This EUA will remain  in effect (meaning this test can be used) for the duration of the COVID-19 declaration under Section 564(b)(1) of the Act, 21 U.S.C.section 360bbb-3(b)(1), unless the authorization is terminated  or revoked sooner.       Influenza A by PCR NEGATIVE NEGATIVE Final   Influenza B by PCR NEGATIVE NEGATIVE Final    Comment: (NOTE) The Xpert Xpress SARS-CoV-2/FLU/RSV plus assay is intended as an aid in the diagnosis of influenza from Nasopharyngeal swab specimens and should not be used as a sole basis for treatment. Nasal washings and aspirates are unacceptable for Xpert Xpress SARS-CoV-2/FLU/RSV testing.  Fact Sheet for Patients: EntrepreneurPulse.com.au  Fact Sheet for Healthcare Providers: IncredibleEmployment.be  This test is not yet approved or cleared by the [place] FDA and has been authorized for detection and/or diagnosis of SARS-CoV-2 by FDA under an Emergency Use Authorization (EUA). This EUA will remain in effect (meaning this test can be used) for the duration of the COVID-19 declaration under Section 564(b)(1) of the Act, 21 U.S.C. section 360bbb-3(b)(1), unless the authorization is terminated or revoked.  Performed at [facility], [address] [place], [place] [address]     Procedures and diagnostic studies:  DG Chest 1 View  Result Date: 12/14/2020 CLINICAL DATA:  EMS brings pt in for c/o CP; pt reports awoke PTA with mid CP radiating into left arm unrelieved by NTG; hx MI AND CVA; denies any accomp symptoms EXAM: CHEST  1 VIEW COMPARISON:  Chest x-ray 10/28/2020 FINDINGS: The heart size and mediastinal contours are unchanged. No focal consolidation. No pulmonary edema. No pleural effusion. No pneumothorax. No acute osseous abnormality.  IMPRESSION: No active disease. Electronically Signed   By: Iven Finn M.D.   On: 12/14/2020  04:54               LOS: 0 days   Novice Vrba  [facility]   Pager on [email or web address]. If 7PM-7AM, please contact night-coverage at [email or web address]     12/15/2020, 1:52 PM

## 2020-12-15 NOTE — Progress Notes (Signed)
[facility]  ROUNDING NOTE   Subjective:   Hemodialysis treatment yesterday. UF of 2 liters.   Patient had chest pain during dialysis treatment. Rapid response was called. He was given IV morphine and sublingual nitro which relieved his chest pain.    Patient with nausea and vomiting this morning. States his chest pain has improved.   Elevated glucose readings.   Objective:  Vital signs in last 24 hours:  Temp:  [98.5 F (36.9 C)-99.5 F (37.5 C)] 98.5 F (36.9 C) (06/22 0830) Pulse Rate:  [92-112] 92 (06/22 0630) Resp:  [11-24] 15 (06/22 0630) BP: (138-181)/(80-101) 166/82 (06/22 1036) SpO2:  [97 %-100 %] 100 % (06/22 0830)  Weight change:  Filed Weights   12/14/20 0428  Weight: 72.6 kg    Intake/Output: I/O last 3 completed shifts: In: -  Out: 2001 [Other:2001]   Intake/Output this shift:  No intake/output data recorded.  Physical Exam: General: NAD, laying in bed  Head: Normocephalic, atraumatic. Moist oral mucosal membranes  Eyes: Anicteric  Lungs:  Clear to auscultation, normal breathing effort  Chest Reproducible chest pain with palpation  Heart: Regular rate and rhythm  Abdomen:  Soft, nontender  Extremities:  no peripheral edema.  Neurologic: Nonfocal, moving all four extremities  Skin: No lesions  Access: Lt AVF    Basic Metabolic Panel: Recent Labs  Lab 12/14/20 0431 12/14/20 0559 12/15/20 0332 12/15/20 0812  NA 137 137 131*  --   K 7.1* 7.0* 5.4*  --   CL 98 97* 90*  --   CO2 '24 30 29  '$ --   GLUCOSE 287* 300* 195* 479*  BUN 52* 52* 41*  --   CREATININE 8.70* 8.94* 6.98*  --   CALCIUM 8.5* 9.3 8.3*  --      Liver Function Tests: Recent Labs  Lab 12/14/20 0431  AST 31  ALT 30  ALKPHOS 131*  BILITOT 1.0  PROT 7.2  ALBUMIN 3.4*    No results for input(s): LIPASE, AMYLASE in the last 168 hours. No results for input(s): AMMONIA in the last 168 hours.  CBC: Recent Labs  Lab 12/14/20 0431  WBC 7.8  NEUTROABS 5.8   HGB 8.5*  HCT 27.2*  MCV 96.8  PLT 228     Cardiac Enzymes: No results for input(s): CKTOTAL, CKMB, CKMBINDEX, TROPONINI in the last 168 hours.  BNP: Invalid input(s): POCBNP  CBG: Recent Labs  Lab 12/14/20 0827 12/14/20 1706 12/14/20 2112 12/15/20 0751  GLUCAP 121* 411* 362* 421*     Microbiology: Results for orders placed or performed during the hospital encounter of 12/14/20  Resp Panel by RT-PCR (Flu A&B, Covid) Nasopharyngeal Swab     Status: None   Collection Time: 12/14/20  5:59 AM   Specimen: Nasopharyngeal Swab; Nasopharyngeal(NP) swabs in vial transport medium  Result Value Ref Range Status   SARS Coronavirus 2 by RT PCR NEGATIVE NEGATIVE Final    Comment: (NOTE) SARS-CoV-2 target nucleic acids are NOT DETECTED.  The SARS-CoV-2 RNA is generally detectable in upper respiratory specimens during the acute phase of infection. The lowest concentration of SARS-CoV-2 viral copies this assay can detect is 138 copies/mL. A negative result does not preclude SARS-Cov-2 infection and should not be used as the sole basis for treatment or other patient management decisions. A negative result may occur with  improper specimen collection/handling, submission of specimen other than nasopharyngeal swab, presence of viral mutation(s) within the areas targeted by this assay, and inadequate number of viral copies(<138  copies/mL). A negative result must be combined with clinical observations, patient history, and epidemiological information. The expected result is Negative.  Fact Sheet for Patients:  EntrepreneurPulse.com.au  Fact Sheet for Healthcare Providers:  IncredibleEmployment.be  This test is no t yet approved or cleared by the [place] FDA and  has been authorized for detection and/or diagnosis of SARS-CoV-2 by FDA under an Emergency Use Authorization (EUA). This EUA will remain  in effect (meaning this test can be used)  for the duration of the COVID-19 declaration under Section 564(b)(1) of the Act, 21 U.S.C.section 360bbb-3(b)(1), unless the authorization is terminated  or revoked sooner.       Influenza A by PCR NEGATIVE NEGATIVE Final   Influenza B by PCR NEGATIVE NEGATIVE Final    Comment: (NOTE) The Xpert Xpress SARS-CoV-2/FLU/RSV plus assay is intended as an aid in the diagnosis of influenza from Nasopharyngeal swab specimens and should not be used as a sole basis for treatment. Nasal washings and aspirates are unacceptable for Xpert Xpress SARS-CoV-2/FLU/RSV testing.  Fact Sheet for Patients: EntrepreneurPulse.com.au  Fact Sheet for Healthcare Providers: IncredibleEmployment.be  This test is not yet approved or cleared by the [place] FDA and has been authorized for detection and/or diagnosis of SARS-CoV-2 by FDA under an Emergency Use Authorization (EUA). This EUA will remain in effect (meaning this test can be used) for the duration of the COVID-19 declaration under Section 564(b)(1) of the Act, 21 U.S.C. section 360bbb-3(b)(1), unless the authorization is terminated or revoked.  Performed at [facility], [address] [place], [place] [address]     Coagulation Studies: No results for input(s): LABPROT, INR in the last 72 hours.  Urinalysis: No results for input(s): COLORURINE, LABSPEC, PHURINE, GLUCOSEU, HGBUR, BILIRUBINUR, KETONESUR, PROTEINUR, UROBILINOGEN, NITRITE, LEUKOCYTESUR in the last 72 hours.  Invalid input(s): APPERANCEUR    Imaging: DG Chest 1 View  Result Date: 12/14/2020 CLINICAL DATA:  EMS brings pt in for c/o CP; pt reports awoke PTA with mid CP radiating into left arm unrelieved by NTG; hx MI AND CVA; denies any accomp symptoms EXAM: CHEST  1 VIEW COMPARISON:  Chest x-ray 10/28/2020 FINDINGS: The heart size and mediastinal contours are unchanged. No focal consolidation. No pulmonary edema. No pleural  effusion. No pneumothorax. No acute osseous abnormality. IMPRESSION: No active disease. Electronically Signed   By: Iven Finn M.D.   On: 12/14/2020 04:54     Medications:      amLODipine  10 mg Oral Daily   aspirin  81 mg Oral Daily   atorvastatin  80 mg Oral QPM   carvedilol  37.5 mg Oral BID WC   cinacalcet  120 mg Oral Daily   clopidogrel  75 mg Oral Daily   ferric citrate  420 mg Oral TID   gabapentin  600 mg Oral QHS   heparin  5,000 Units Subcutaneous Q8H   hydrALAZINE  100 mg Oral Q8H   insulin aspart  0-5 Units Subcutaneous QHS   insulin aspart  0-9 Units Subcutaneous TID WC   insulin glargine  13 Units Subcutaneous Q2200   isosorbide mononitrate  30 mg Oral Daily   patiromer  8.4 g Oral Daily   sertraline  150 mg Oral q AM   acetaminophen, albuterol, hydrALAZINE, hydrOXYzine, morphine injection, nitroGLYCERIN, nitroGLYCERIN, ondansetron (ZOFRAN) IV, ondansetron  Assessment/ Plan:  [name] is a 35 y.o.  male with Type 1 dialbetes, CAD, PVD, depression, CVA and ESRD on dialysis , who was admitted to [facility]  on 11/30/2020 for Hyperkalemia [E87.5] ESRD (end stage renal disease) on dialysis ([place]) [N18.6, Z99.2] Chest pain [R07.9] Nonspecific chest pain [R07.9]    End Stage Renal Disease on hemodialysis: with hyperkalemia. Emergent hemodialysis treatment yesterday. No acute indication for dialysis today.  - continue paitromer - low potassium diet.   2. 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 Home regimen includes Novolog and lantus. Hemoglobin A1c of 12.4% - poor control.  Recent admission to [facility] last month for DKA.   3. Secondary Hyperparathyroidism:   - cinacalcet - Auryxia with meals.   4. Hypertension: 166/82. Home regimen of amlodipine, carvedilol, hydralazine.    LOS: 0 Waylyn Tenbrink 6/22/202211:41 AM

## 2020-12-15 NOTE — Plan of Care (Signed)

## 2020-12-15 NOTE — Progress Notes (Signed)
Progress Note  Patient Name: Raymond Lamb Date of Encounter: 12/15/2020  Primary Cardiologist: None  Subjective   N/V x 2 hrs this AM w/ associated chest wall pain.  No dyspnea.  Inpatient Medications    Scheduled Meds:  amLODipine  10 mg Oral Daily   aspirin  81 mg Oral Daily   atorvastatin  80 mg Oral QPM   carvedilol  37.5 mg Oral BID WC   cinacalcet  120 mg Oral Daily   clopidogrel  75 mg Oral Daily   ferric citrate  420 mg Oral TID   gabapentin  600 mg Oral QHS   heparin  5,000 Units Subcutaneous Q8H   hydrALAZINE  100 mg Oral Q8H   insulin aspart  0-5 Units Subcutaneous QHS   insulin aspart  0-9 Units Subcutaneous TID WC   insulin glargine  13 Units Subcutaneous Q2200   isosorbide mononitrate  30 mg Oral Daily   patiromer  8.4 g Oral Daily   sertraline  150 mg Oral q AM   Continuous Infusions:  PRN Meds: acetaminophen, albuterol, hydrALAZINE, hydrOXYzine, morphine injection, nitroGLYCERIN, nitroGLYCERIN, ondansetron (ZOFRAN) IV, ondansetron, ondansetron   Vital Signs    Vitals:   12/14/20 1700 12/14/20 1715 12/15/20 0618 12/15/20 0630  BP:   (!) 181/101 (!) 167/92  Pulse:   94 92  Resp: 17 (!) '24 14 15  '$ Temp:   99.5 F (37.5 C)   TempSrc:   Oral   SpO2:   97% 98%  Weight:      Height:        Intake/Output Summary (Last 24 hours) at 12/15/2020 0827 Last data filed at 12/14/2020 1300 Gross per 24 hour  Intake --  Output 2001 ml  Net -2001 ml   Filed Weights   12/14/20 0428  Weight: 72.6 kg    Physical Exam   GEN: Well nourished, well developed, in no acute distress.  HEENT: Grossly normal.  Neck: Supple, no JVD, carotid bruits, or masses. Cardiac: RRR, no murmurs, rubs, or gallops. No clubbing, cyanosis, edema.  Radials 2+, DP/PT 1+ and equal bilaterally.  Respiratory:  Respirations regular and unlabored, clear to auscultation bilaterally. GI: Soft, nontender, nondistended, BS + x 4. MS: no deformity or atrophy. Skin: warm and dry, no  rash. Neuro:  Strength and sensation are intact. Psych: AAOx3.  Normal affect.  Labs    Chemistry Recent Labs  Lab 12/14/20 0431 12/14/20 0559 12/15/20 0332  NA 137 137 131*  K 7.1* 7.0* 5.4*  CL 98 97* 90*  CO2 '24 30 29  '$ GLUCOSE 287* 300* 195*  BUN 52* 52* 41*  CREATININE 8.70* 8.94* 6.98*  CALCIUM 8.5* 9.3 8.3*  PROT 7.2  --   --   ALBUMIN 3.4*  --   --   AST 31  --   --   ALT 30  --   --   ALKPHOS 131*  --   --   BILITOT 1.0  --   --   GFRNONAA 7* 7* 10*  ANIONGAP '15 10 12     '$ Hematology Recent Labs  Lab 12/14/20 0431  WBC 7.8  RBC 2.81*  HGB 8.5*  HCT 27.2*  MCV 96.8  MCH 30.2  MCHC 31.3  RDW 15.9*  PLT 228    Cardiac Enzymes  Recent Labs  Lab 12/14/20 0431 12/14/20 0559 12/14/20 0828 12/14/20 1213 12/14/20 1422  TROPONINIHS '13 14 13 14 13      '$ Lipids  Lab Results  Component  Value Date   CHOL 205 (H) 12/15/2020   HDL 38 (L) 12/15/2020   LDLCALC 136 (H) 12/15/2020   TRIG 155 (H) 12/15/2020   CHOLHDL 5.4 12/15/2020    HbA1c  Lab Results  Component Value Date   HGBA1C 12.4 (H) 12/01/2020    Radiology    DG Chest 1 View  Result Date: 12/14/2020 CLINICAL DATA:  EMS brings pt in for c/o CP; pt reports awoke PTA with mid CP radiating into left arm unrelieved by NTG; hx MI AND CVA; denies any accomp symptoms EXAM: CHEST  1 VIEW COMPARISON:  Chest x-ray 10/28/2020 FINDINGS: The heart size and mediastinal contours are unchanged. No focal consolidation. No pulmonary edema. No pleural effusion. No pneumothorax. No acute osseous abnormality. IMPRESSION: No active disease. Electronically Signed   By: Iven Finn M.D.   On: 12/14/2020 04:54    Telemetry    Sinus tachycardia - 100-120 - Personally Reviewed  Cardiac Studies   A. 11/2015 Cath: LM nl, LAD 30p, LCX nl, OM2 70-80, RCA 50-47m   B.  08/2020 MV: EF 56%. No isch/infarct.  C.  10/2020 NSTEMI in setting of DKA-->Cath: LM nl, LAD nl, D2 70, LCX nl, OM2 70, OM3 70 inf branch, RCA  80p/m-->Med rx.  D.  10/2020 Echo: EF >55%, nl RV fxn.  Patient Profile     35y.o. male with a history of nonobstructive CAD, HTN, HL, PVD, stroke, type I DM, ESRD on HD, tobacco abuse, and anemia of chronic dzs, who was admitted 6/21 w/ chest pain and hyperkalemia.  HsTrops nl.  Assessment & Plan    1.  Chest pain/coronary artery disease:  Patient has chronic chest pain dating back to 2017, occurring daily, lasting a few minutes, resolving spontaneously.  He had nonobstructive disease on catheterization 2017 and more recently, a negative stress test in March 2022.  He underwent repeat catheter[facility] May 2022 [facility]  He was noted to have progression of proximal to mid RCA disease, now measured at 80%, and recommendation was made for medical therapy. He was admitted 6/21 after developing nocturnal c/p x 1 hr, relieved w/ sl ntg.  Found to be hyerkalemic.  HsTrops nl.  Recurrent c/p during HD on 6/21 w/ subtle inferior ST depression.  F/u HsTrops remained nl.  We added imdur on 6/21.  He's had some recurrent chest wall pain in the setting of n/v over the past two hours.  PRN zofran sl orderred.  With nl HsTrops, will cont med rx including nitrate, asa, ? blocker,statin, and plavix.  He has a cardiologist @ [facility] and rec f/u as outpt.  Suspect @ some point he will require PCI of the RCA, but as prev noted, stent patency may prove difficult in the setting of very poorly controlled DM.  2.  Hyperklaemia:  K 7.1 on admission.  S/p HD 6/21.  K 5.4 this AM.  Veltassa ordered.  Per IM.  3.  N/V:  started about 2 hrs ago.  No IV.  Sl zofran ordered.  4.  Essential HTN:  Pressure elevated this AM in setting of n/v - unable to keep POs down currently.  Doesn't have an IV.  Treat n/v and try to resume POs.  Once he has IV access, can use IV metoprolol or labetalol if necessary.  5.  DMI:  poorly controlled w/ A1c of 1 in May.  Per IM.  6.  HL:  cont statin.  7.  PAD:   s/p prior R SFA stenting and subsequent left lower ext Fem Pop bypass w/ stable ABIs in 04/2020.  Followed by vasc surgery @ [facility].  Cont asa, plavix, stating rx.  8.  Anemia of chronic dzs:  stable.  Signed, Murray Hodgkins, NP  12/15/2020, 8:27 AM    For questions or updates, please contact   Please consult [email or web address] for contact info under Cardiology/STEMI.

## 2020-12-16 DIAGNOSIS — I120 Hypertensive chronic kidney disease with stage 5 chronic kidney disease or end stage renal disease: Secondary | ICD-10-CM | POA: Diagnosis present

## 2020-12-16 DIAGNOSIS — R079 Chest pain, unspecified: Secondary | ICD-10-CM | POA: Diagnosis present

## 2020-12-16 DIAGNOSIS — E10649 Type 1 diabetes mellitus with hypoglycemia without coma: Secondary | ICD-10-CM | POA: Diagnosis not present

## 2020-12-16 DIAGNOSIS — Z95820 Peripheral vascular angioplasty status with implants and grafts: Secondary | ICD-10-CM | POA: Diagnosis not present

## 2020-12-16 DIAGNOSIS — E782 Mixed hyperlipidemia: Secondary | ICD-10-CM | POA: Diagnosis present

## 2020-12-16 DIAGNOSIS — Z992 Dependence on renal dialysis: Secondary | ICD-10-CM | POA: Diagnosis not present

## 2020-12-16 DIAGNOSIS — Z794 Long term (current) use of insulin: Secondary | ICD-10-CM | POA: Diagnosis not present

## 2020-12-16 DIAGNOSIS — N186 End stage renal disease: Secondary | ICD-10-CM | POA: Diagnosis present

## 2020-12-16 DIAGNOSIS — R34 Anuria and oliguria: Secondary | ICD-10-CM | POA: Diagnosis present

## 2020-12-16 DIAGNOSIS — G8929 Other chronic pain: Secondary | ICD-10-CM | POA: Diagnosis present

## 2020-12-16 DIAGNOSIS — F418 Other specified anxiety disorders: Secondary | ICD-10-CM | POA: Diagnosis present

## 2020-12-16 DIAGNOSIS — N2581 Secondary hyperparathyroidism of renal origin: Secondary | ICD-10-CM | POA: Diagnosis present

## 2020-12-16 DIAGNOSIS — D631 Anemia in chronic kidney disease: Secondary | ICD-10-CM

## 2020-12-16 DIAGNOSIS — I2510 Atherosclerotic heart disease of native coronary artery without angina pectoris: Secondary | ICD-10-CM | POA: Diagnosis present

## 2020-12-16 DIAGNOSIS — E1065 Type 1 diabetes mellitus with hyperglycemia: Secondary | ICD-10-CM

## 2020-12-16 DIAGNOSIS — Z8673 Personal history of transient ischemic attack (TIA), and cerebral infarction without residual deficits: Secondary | ICD-10-CM | POA: Diagnosis not present

## 2020-12-16 DIAGNOSIS — I25118 Atherosclerotic heart disease of native coronary artery with other forms of angina pectoris: Secondary | ICD-10-CM | POA: Diagnosis not present

## 2020-12-16 DIAGNOSIS — E1051 Type 1 diabetes mellitus with diabetic peripheral angiopathy without gangrene: Secondary | ICD-10-CM | POA: Diagnosis present

## 2020-12-16 DIAGNOSIS — E875 Hyperkalemia: Secondary | ICD-10-CM | POA: Diagnosis present

## 2020-12-16 DIAGNOSIS — R0602 Shortness of breath: Secondary | ICD-10-CM | POA: Diagnosis present

## 2020-12-16 DIAGNOSIS — I70203 Unspecified atherosclerosis of native arteries of extremities, bilateral legs: Secondary | ICD-10-CM | POA: Diagnosis present

## 2020-12-16 DIAGNOSIS — Z20822 Contact with and (suspected) exposure to covid-19: Secondary | ICD-10-CM | POA: Diagnosis present

## 2020-12-16 DIAGNOSIS — R0789 Other chest pain: Secondary | ICD-10-CM | POA: Diagnosis present

## 2020-12-16 DIAGNOSIS — Z7902 Long term (current) use of antithrombotics/antiplatelets: Secondary | ICD-10-CM | POA: Diagnosis not present

## 2020-12-16 DIAGNOSIS — E1029 Type 1 diabetes mellitus with other diabetic kidney complication: Secondary | ICD-10-CM

## 2020-12-16 DIAGNOSIS — R112 Nausea with vomiting, unspecified: Secondary | ICD-10-CM | POA: Diagnosis present

## 2020-12-16 DIAGNOSIS — E1022 Type 1 diabetes mellitus with diabetic chronic kidney disease: Secondary | ICD-10-CM | POA: Diagnosis present

## 2020-12-16 DIAGNOSIS — R072 Precordial pain: Secondary | ICD-10-CM | POA: Diagnosis not present

## 2020-12-16 LAB — CBC WITH DIFFERENTIAL/PLATELET
Abs Immature Granulocytes: 0.01 10*3/uL (ref 0.00–0.07)
Basophils Absolute: 0.1 10*3/uL (ref 0.0–0.1)
Basophils Relative: 1 %
Eosinophils Absolute: 0.4 10*3/uL (ref 0.0–0.5)
Eosinophils Relative: 8 %
HCT: 24.4 % — ABNORMAL LOW (ref 39.0–52.0)
Hemoglobin: 7.8 g/dL — ABNORMAL LOW (ref 13.0–17.0)
Immature Granulocytes: 0 %
Lymphocytes Relative: 30 %
Lymphs Abs: 1.7 10*3/uL (ref 0.7–4.0)
MCH: 30 pg (ref 26.0–34.0)
MCHC: 32 g/dL (ref 30.0–36.0)
MCV: 93.8 fL (ref 80.0–100.0)
Monocytes Absolute: 0.7 10*3/uL (ref 0.1–1.0)
Monocytes Relative: 14 %
Neutro Abs: 2.6 10*3/uL (ref 1.7–7.7)
Neutrophils Relative %: 47 %
Platelets: 216 10*3/uL (ref 150–400)
RBC: 2.6 MIL/uL — ABNORMAL LOW (ref 4.22–5.81)
RDW: 15.8 % — ABNORMAL HIGH (ref 11.5–15.5)
WBC: 5.5 10*3/uL (ref 4.0–10.5)
nRBC: 0 % (ref 0.0–0.2)

## 2020-12-16 LAB — RENAL FUNCTION PANEL
Albumin: 3.3 g/dL — ABNORMAL LOW (ref 3.5–5.0)
Anion gap: 12 (ref 5–15)
BUN: 52 mg/dL — ABNORMAL HIGH (ref 6–20)
CO2: 29 mmol/L (ref 22–32)
Calcium: 8.3 mg/dL — ABNORMAL LOW (ref 8.9–10.3)
Chloride: 94 mmol/L — ABNORMAL LOW (ref 98–111)
Creatinine, Ser: 8.97 mg/dL — ABNORMAL HIGH (ref 0.61–1.24)
GFR, Estimated: 7 mL/min — ABNORMAL LOW (ref >60)
Glucose, Bld: 168 mg/dL — ABNORMAL HIGH (ref 70–99)
Phosphorus: 3.6 mg/dL (ref 2.5–4.6)
Potassium: 5.2 mmol/L — ABNORMAL HIGH (ref 3.5–5.1)
Sodium: 135 mmol/L (ref 135–145)

## 2020-12-16 LAB — GLUCOSE, CAPILLARY
Glucose-Capillary: 158 mg/dL — ABNORMAL HIGH (ref 70–99)
Glucose-Capillary: 133 mg/dL — ABNORMAL HIGH (ref 70–99)
Glucose-Capillary: 343 mg/dL — ABNORMAL HIGH (ref 70–99)
Glucose-Capillary: 275 mg/dL — ABNORMAL HIGH (ref 70–99)

## 2020-12-16 MED ORDER — ACETAMINOPHEN 325 MG PO TABS: 650.0000 mg | ORAL_TABLET | Freq: Four times a day (QID) | ORAL | Status: DC | PRN

## 2020-12-16 MED ORDER — NITROGLYCERIN 0.4 MG SL SUBL: 0.4000 mg | SUBLINGUAL_TABLET | SUBLINGUAL | Status: DC | PRN

## 2020-12-16 NOTE — Progress Notes (Signed)
Progress Note  Patient Name: Raymond Lamb Date of Encounter: 12/16/2020  [facility] Cardiologist: Dr. Saunders Revel, [facility]  Subjective   Chest pain yesterday at the end of dialysis Required nitro x2 No further chest pain since that time, tolerated dialysis today with no chest pain Blood pressure continues to run high  Inpatient Medications    Scheduled Meds:  amLODipine  10 mg Oral Daily   aspirin  81 mg Oral Daily   atorvastatin  80 mg Oral QPM   carvedilol  37.5 mg Oral BID WC   cinacalcet  120 mg Oral Daily   clopidogrel  75 mg Oral Daily   ferric citrate  420 mg Oral TID   gabapentin  600 mg Oral QHS   heparin  5,000 Units Subcutaneous Q8H   hydrALAZINE  100 mg Oral Q8H   insulin aspart  0-5 Units Subcutaneous QHS   insulin aspart  0-9 Units Subcutaneous TID WC   insulin glargine  13 Units Subcutaneous Q2200   isosorbide mononitrate  30 mg Oral Daily   patiromer  8.4 g Oral Daily   sertraline  150 mg Oral q AM   Continuous Infusions:  PRN Meds: acetaminophen, albuterol, hydrALAZINE, hydrOXYzine, morphine injection, nitroGLYCERIN, ondansetron (ZOFRAN) IV, ondansetron   Vital Signs    Vitals:   12/16/20 1500 12/16/20 1513 12/16/20 1515 12/16/20 1530  BP: (!) 184/100  (!) 160/95 (!) 162/96  Pulse:      Resp: '19 15 15   '$ Temp: 98.4 F (36.9 C)     TempSrc: Oral     SpO2:      Weight:      Height:        Intake/Output Summary (Last 24 hours) at 12/16/2020 1549 Last data filed at 12/16/2020 1445 Gross per 24 hour  Intake 480 ml  Output 1500 ml  Net -1020 ml   Last 3 Weights 12/16/2020 12/14/2020 12/02/2020  Weight (lbs) 148 lb 13 oz 160 lb 150 lb 5.7 oz  Weight (kg) 67.5 kg 72.576 kg 68.2 kg      Telemetry    Normal sinus rhythm- Personally Reviewed  ECG     - Personally Reviewed  Physical Exam   GEN: No acute distress.   Neck: No JVD Cardiac: RRR, no murmurs, rubs, or gallops.  Respiratory: Clear to auscultation bilaterally. GI: Soft, nontender,  non-distended  MS: No edema; No deformity. Neuro:  Nonfocal  Psych: Normal affect   Labs    High Sensitivity Troponin:   Recent Labs  Lab 12/14/20 0431 12/14/20 0559 12/14/20 0828 12/14/20 1213 12/14/20 1422  TROPONINIHS '13 14 13 14 13      '$ Chemistry Recent Labs  Lab 12/14/20 0431 12/14/20 0559 12/15/20 0332 12/15/20 0812 12/16/20 0435  NA 137 137 131*  --  135  K 7.1* 7.0* 5.4*  --  5.2*  CL 98 97* 90*  --  94*  CO2 '24 30 29  '$ --  29  GLUCOSE 287* 300* 195* 479* 168*  BUN 52* 52* 41*  --  52*  CREATININE 8.70* 8.94* 6.98*  --  8.97*  CALCIUM 8.5* 9.3 8.3*  --  8.3*  PROT 7.2  --   --   --   --   ALBUMIN 3.4*  --   --   --  3.3*  AST 31  --   --   --   --   ALT 30  --   --   --   --   [facility]  131*  --   --   --   --   BILITOT 1.0  --   --   --   --   GFRNONAA 7* 7* 10*  --  7*  ANIONGAP '15 10 12  '$ --  12     Hematology Recent Labs  Lab 12/14/20 0431 12/16/20 0435  WBC 7.8 5.5  RBC 2.81* 2.60*  HGB 8.5* 7.8*  HCT 27.2* 24.4*  MCV 96.8 93.8  MCH 30.2 30.0  MCHC 31.3 32.0  RDW 15.9* 15.8*  PLT 228 216    BNPNo results for input(s): BNP, PROBNP in the last 168 hours.   DDimer No results for input(s): DDIMER in the last 168 hours.   Radiology    No results found.  Cardiac Studies   No recent echocardiogram available  Patient Profile     Raymond Lamb is a 35 y.o. male with a history of nonobstructive CAD, HTN, HL, PVD, stroke, type I DM, ESRD on HD, tobacco abuse, and anemia of chronic dzs, who is being seen today for the evaluation of chest pain  Assessment & Plan     1.  Chest pain/coronary artery disease:   chronic chest pain dating back to 2017,  nonobstructive disease on catheterization 2017  negative stress test in March 2022.   repeat catheterization in May 2022 in the setting of non-STEMI and DKA, at [facility].  progression of proximal to mid RCA disease, now measured at 80%, and recommendation was made for medical therapy.   -Chest  pain this admission with no significant troponin elevation -Recommend we increase his isosorbide up to 30 mg twice daily High risk of progressive disease given poorly controlled diabetes   2.  Hyperkalemia:  In the setting of end-stage renal disease.    potassium of 7.1.   treated with calcium gluconate, insulin, D50, and sodium bicarb in the ED followed by dialysis.    3.  Essential hypertension:  Would recommend increasing Imdur up to 30 twice daily  4.  Hyperlipidemia:  Continue statin therapy.  5.  Type 1 diabetes mellitus:  Hemoglobin A1c greater than 14 on May 24.  Stressed importance of aggressive diabetes management  6.  Peripheral arterial disease:  Status post prior right SFA stenting and subsequent left lower extremity femoral to popliteal bypass with stable ABIs in November of last year.   Followed by vascular surgery at [facility].  High risk of disease progression given his diabetes as detailed above   7.  Anemia of chronic disease:  Appears to be relatively stable.    Total encounter time more than 25 minutes  Greater than 50% was spent in counseling and coordination of care with the patient   For questions or updates, please contact [place] Please consult [email or web address] for contact info under        Signed, Ida Rogue, MD  12/16/2020, 3:49 PM

## 2020-12-16 NOTE — Progress Notes (Signed)
Patient arrived back to room from HD without distress. Sandwich tray and drink provided to patient. Linens to be changed at this time. All patient needs met at this time. Will administer medications once patient has been able to eat.

## 2020-12-16 NOTE — Progress Notes (Signed)
Progress Note    Raymond Lamb  [id number] DOB: 02-25-1986  DOA: 12/14/2020 PCP: Norina Buzzard, MD      Brief Narrative:    Medical records reviewed and are as summarized below:  Raymond Lamb is a 35 y.o. male with medical history significant for ESRD on hemodialysis on Tuesdays, Thursdays and Saturdays, hypertension, hyperlipidemia, brittle type 1 diabetes mellitus, stroke, anemia of chronic disease, PVD, CAD s/p heart catheterization in May 2022 which showed progression of proximal to mid RCA disease at 80% (medical therapy was recommended).  He presented to the hospital with chest pain and shortness of breath.  He was admitted to the hospital for observation.  He was found to have hyperkalemia with potassium of 7.1 associated with peaked T waves. Troponins were negative.  Cardiologist was consulted because of multiple cardiac risk factors.   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   Chest pain, CAD: Continue aspirin, Plavix and Lipitor.  Cardiologist on board.  Troponin is normal.  No plan for left heart cath.  S/p heart catheter May 2022 at [facility] which showed progression of proximal to mid RCA disease at 80% medical therapy was recommended.  He complained of chest pain at the hemodialysis center today and a rapid response was called.  Continue to monitor closely in the hospital.  ESRD with hyperkalemia: Hyperkalemia is improving.  Patient is down to 5.2.  Patient will have hemodialysis today.  Nausea and vomiting: Antiemetics as needed.  Brittle type 1 diabetes  mellitus with hyperglycemia: Glucose is better.  Continue Lantus and NovoLog.  Patient has history of hypoglycemic episodes in the past which makes upward titration of Lantus difficult.  Monitor glucose levels closely.  Other comorbidities include hypertension, hyperlipidemia, history of stroke, PVD, depression, anxiety, anemia of chronic disease     Diet Order             Diet renal with fluid restriction Fluid restriction: 1200 mL Fluid; Room service appropriate? Yes; Fluid consistency: Thin  Diet effective now                      Consultants: Nephrologist  Procedures: None    Medications:    amLODipine  10 mg Oral Daily   aspirin  81 mg Oral Daily   atorvastatin  80 mg Oral QPM   carvedilol  37.5 mg Oral BID WC   cinacalcet  120 mg Oral Daily   clopidogrel  75 mg Oral Daily   ferric citrate  420 mg Oral TID   gabapentin  600 mg Oral QHS   heparin  5,000 Units Subcutaneous Q8H   hydrALAZINE  100 mg Oral Q8H   insulin aspart  0-5 Units Subcutaneous QHS   insulin aspart  0-9 Units Subcutaneous TID WC   insulin glargine  13 Units Subcutaneous Q2200   isosorbide mononitrate  30 mg Oral Daily   patiromer  8.4 g Oral Daily   sertraline  150 mg Oral q AM   Continuous Infusions:   Anti-infectives (From admission, onward)    None  Family Communication/Anticipated D/C date and plan/Code Status   DVT prophylaxis: heparin injection 5,000 Units Start: 12/15/20 0600     Code Status: Full Code  Family Communication: None Disposition Plan:    Status is: Observation  The patient will require care spanning > 2 midnights and should be moved to inpatient because: IV treatments appropriate due to intensity of illness or inability to take PO and Inpatient level of care appropriate due to severity of illness  Dispo: The patient is from: Home              Anticipated d/c is to: Home              Patient currently is not medically stable to  d/c.   Difficult to place patient No           Subjective:   Interval events noted.  Reportedly, rapid response was called after dialysis unit because of complaints of chest pain during hemodialysis.  Objective:    Vitals:   12/16/20 1330 12/16/20 1343 12/16/20 1345 12/16/20 1358  BP:   (!) 160/95 (!) 163/84  Pulse: 86 87 87 85  Resp: '14 17 17 14  '$ Temp:      TempSrc:      SpO2: 100% 100% 100% 100%  Weight:      Height:       No data found.   Intake/Output Summary (Last 24 hours) at 12/16/2020 1507 Last data filed at 12/16/2020 0950 Gross per 24 hour  Intake 480 ml  Output --  Net 480 ml   Filed Weights   12/14/20 0428 12/16/20 0426  Weight: 72.6 kg 67.5 kg    Exam:  GEN: NAD SKIN: No rash EYES: EOMI ENT: MMM CV: RRR PULM: CTA B ABD: soft, ND, NT, +BS CNS: AAO x 3, non focal EXT: No edema or tenderness       Data Reviewed:   I have personally reviewed following labs and imaging studies:  Labs: Labs show the following:   Basic Metabolic Panel: Recent Labs  Lab 12/14/20 0431 12/14/20 0559 12/15/20 0332 12/15/20 0812 12/16/20 0435  NA 137 137 131*  --  135  K 7.1* 7.0* 5.4*  --  5.2*  CL 98 97* 90*  --  94*  CO2 '24 30 29  '$ --  29  GLUCOSE 287* 300* 195* 479* 168*  BUN 52* 52* 41*  --  52*  CREATININE 8.70* 8.94* 6.98*  --  8.97*  CALCIUM 8.5* 9.3 8.3*  --  8.3*  PHOS  --   --   --   --  3.6   GFR Estimated Creatinine Clearance: 10.8 mL/min (A) (by C-G formula based on SCr of 8.97 mg/dL (H)). Liver Function Tests: Recent Labs  Lab 12/14/20 0431 12/16/20 0435  AST 31  --   ALT 30  --   ALKPHOS 131*  --   BILITOT 1.0  --   PROT 7.2  --   ALBUMIN 3.4* 3.3*   No results for input(s): LIPASE, AMYLASE in the last 168 hours. No results for input(s): AMMONIA in the last 168 hours. Coagulation profile No results for input(s): INR, PROTIME in the last 168 hours.  CBC: Recent Labs  Lab 12/14/20 0431 12/16/20 0435  WBC 7.8 5.5   NEUTROABS 5.8 2.6  HGB 8.5* 7.8*  HCT 27.2* 24.4*  MCV 96.8 93.8  PLT 228 216   Cardiac Enzymes: No results for input(s): CKTOTAL, CKMB, CKMBINDEX, TROPONINI in the last 168 hours.  BNP (last 3 results) No results for input(s): PROBNP in the last 8760 hours. CBG: Recent Labs  Lab 12/15/20 1202 12/15/20 1641 12/15/20 2202 12/15/20 2303 12/16/20 0745  GLUCAP 185* 220* 231* 158* 133*   D-Dimer: No results for input(s): DDIMER in the last 72 hours. Hgb A1c: No results for input(s): HGBA1C in the last 72 hours. Lipid Profile: Recent Labs    12/15/20 0332  CHOL 205*  HDL 38*  LDLCALC 136*  TRIG 155*  CHOLHDL 5.4   Thyroid function studies: No results for input(s): TSH, T4TOTAL, T3FREE, THYROIDAB in the last 72 hours.  Invalid input(s): FREET3 Anemia work up: No results for input(s): VITAMINB12, FOLATE, FERRITIN, TIBC, IRON, RETICCTPCT in the last 72 hours. Sepsis Labs: Recent Labs  Lab 12/14/20 0431 12/16/20 0435  WBC 7.8 5.5    Microbiology Recent Results (from the past 240 hour(s))  Resp Panel by RT-PCR (Flu A&B, Covid) Nasopharyngeal Swab     Status: None   Collection Time: 12/14/20  5:59 AM   Specimen: Nasopharyngeal Swab; Nasopharyngeal(NP) swabs in vial transport medium  Result Value Ref Range Status   SARS Coronavirus 2 by RT PCR NEGATIVE NEGATIVE Final    Comment: (NOTE) SARS-CoV-2 target nucleic acids are NOT DETECTED.  The SARS-CoV-2 RNA is generally detectable in upper respiratory specimens during the acute phase of infection. The lowest concentration of SARS-CoV-2 viral copies this assay can detect is 138 copies/mL. A negative result does not preclude SARS-Cov-2 infection and should not be used as the sole basis for treatment or other patient management decisions. A negative result may occur with  improper specimen collection/handling, submission of specimen other than nasopharyngeal swab, presence of viral mutation(s) within the areas targeted  by this assay, and inadequate number of viral copies(<138 copies/mL). A negative result must be combined with clinical observations, patient history, and epidemiological information. The expected result is Negative.  Fact Sheet for Patients:  EntrepreneurPulse.com.au  Fact Sheet for Healthcare Providers:  IncredibleEmployment.be  This test is no t yet approved or cleared by the [place] FDA and  has been authorized for detection and/or diagnosis of SARS-CoV-2 by FDA under an Emergency Use Authorization (EUA). This EUA will remain  in effect (meaning this test can be used) for the duration of the COVID-19 declaration under Section 564(b)(1) of the Act, 21 U.S.C.section 360bbb-3(b)(1), unless the authorization is terminated  or revoked sooner.       Influenza A by PCR NEGATIVE NEGATIVE Final   Influenza B by PCR NEGATIVE NEGATIVE Final    Comment: (NOTE) The Xpert Xpress SARS-CoV-2/FLU/RSV plus assay is intended as an aid in the diagnosis of influenza from Nasopharyngeal swab specimens and should not be used as a sole basis for treatment. Nasal washings and aspirates are unacceptable for Xpert Xpress SARS-CoV-2/FLU/RSV testing.  Fact Sheet for Patients: EntrepreneurPulse.com.au  Fact Sheet for Healthcare Providers: IncredibleEmployment.be  This test is not yet approved or cleared by the [place] FDA and has been authorized for detection and/or diagnosis of SARS-CoV-2 by FDA under an Emergency Use Authorization (EUA). This EUA will remain in effect (meaning this test can be used) for the duration of the COVID-19 declaration under Section 564(b)(1) of the Act, 21 U.S.C. section 360bbb-3(b)(1), unless the authorization is terminated or revoked.  Performed at [facility], [address] [place], [place] [address]     Procedures and diagnostic studies:  No results  found.             LOS:  0 days   Ronin Crager  [facility]   Pager on [email or web address]. If 7PM-7AM, please contact night-coverage at [email or web address]     12/16/2020, 3:07 PM

## 2020-12-16 NOTE — Progress Notes (Signed)
[facility]  ROUNDING NOTE   Subjective:    Patient had chest pain during dialysis treatment. Rapid response was called. He was given IV morphine and sublingual nitro which relieved his chest pain.   Denies chest pain, nausea and vomiting States he feels a lot better today Patient seen later during dialysis   HEMODIALYSIS FLOWSHEET:  Blood Flow Rate (mL/min): 400 mL/min Arterial Pressure (mmHg): -190 mmHg Venous Pressure (mmHg): 150 mmHg Transmembrane Pressure (mmHg): 60 mmHg Ultrafiltration Rate (mL/min): 1000 mL/min Dialysate Flow Rate (mL/min): 600 ml/min Conductivity: Machine : 13.6 Conductivity: Machine : 13.6 Dialysate Change: 2K   Objective:  Vital signs in last 24 hours:  Temp:  [97.9 F (36.6 C)-98.6 F (37 C)] 97.9 F (36.6 C) (06/23 0743) Pulse Rate:  [86-97] 86 (06/23 0743) Resp:  [14-20] 18 (06/23 0743) BP: (146-164)/(84-91) 148/91 (06/23 0743) SpO2:  [95 %-100 %] 100 % (06/23 0743) Weight:  [67.5 kg] 67.5 kg (06/23 0426)  Weight change:  Filed Weights   12/14/20 0428 12/16/20 0426  Weight: 72.6 kg 67.5 kg    Intake/Output: I/O last 3 completed shifts: In: 29 [P.O.:720] Out: -    Intake/Output this shift:  Total I/O In: 240 [P.O.:240] Out: -   Physical Exam: General: NAD, laying in bed  Head: Normocephalic, atraumatic. Moist oral mucosal membranes  Eyes: Anicteric  Lungs:  Clear to auscultation, normal breathing effort  Chest Reproducible chest pain with palpation  Heart: Regular rate and rhythm  Abdomen:  Soft, nontender  Extremities:  no peripheral edema.  Neurologic: Nonfocal, moving all four extremities  Skin: No lesions  Access: Lt AVF    Basic Metabolic Panel: Recent Labs  Lab 12/14/20 0431 12/14/20 0559 12/15/20 0332 12/15/20 0812 12/16/20 0435  NA 137 137 131*  --  135  K 7.1* 7.0* 5.4*  --  5.2*  CL 98 97* 90*  --  94*  CO2 '24 30 29  '$ --  29  GLUCOSE 287* 300* 195* 479* 168*  BUN 52* 52* 41*  --  52*   CREATININE 8.70* 8.94* 6.98*  --  8.97*  CALCIUM 8.5* 9.3 8.3*  --  8.3*  PHOS  --   --   --   --  3.6     Liver Function Tests: Recent Labs  Lab 12/14/20 0431 12/16/20 0435  AST 31  --   ALT 30  --   ALKPHOS 131*  --   BILITOT 1.0  --   PROT 7.2  --   ALBUMIN 3.4* 3.3*    No results for input(s): LIPASE, AMYLASE in the last 168 hours. No results for input(s): AMMONIA in the last 168 hours.  CBC: Recent Labs  Lab 12/14/20 0431 12/16/20 0435  WBC 7.8 5.5  NEUTROABS 5.8 2.6  HGB 8.5* 7.8*  HCT 27.2* 24.4*  MCV 96.8 93.8  PLT 228 216     Cardiac Enzymes: No results for input(s): CKTOTAL, CKMB, CKMBINDEX, TROPONINI in the last 168 hours.  BNP: Invalid input(s): POCBNP  CBG: Recent Labs  Lab 12/15/20 1202 12/15/20 1641 12/15/20 2202 12/15/20 2303 12/16/20 0745  GLUCAP 185* 220* 231* 158* 133*     Microbiology: Results for orders placed or performed during the hospital encounter of 12/14/20  Resp Panel by RT-PCR (Flu A&B, Covid) Nasopharyngeal Swab     Status: None   Collection Time: 12/14/20  5:59 AM   Specimen: Nasopharyngeal Swab; Nasopharyngeal(NP) swabs in vial transport medium  Result Value Ref Range Status   SARS  Coronavirus 2 by RT PCR NEGATIVE NEGATIVE Final    Comment: (NOTE) SARS-CoV-2 target nucleic acids are NOT DETECTED.  The SARS-CoV-2 RNA is generally detectable in upper respiratory specimens during the acute phase of infection. The lowest concentration of SARS-CoV-2 viral copies this assay can detect is 138 copies/mL. A negative result does not preclude SARS-Cov-2 infection and should not be used as the sole basis for treatment or other patient management decisions. A negative result may occur with  improper specimen collection/handling, submission of specimen other than nasopharyngeal swab, presence of viral mutation(s) within the areas targeted by this assay, and inadequate number of viral copies(<138 copies/mL). A negative  result must be combined with clinical observations, patient history, and epidemiological information. The expected result is Negative.  Fact Sheet for Patients:  EntrepreneurPulse.com.au  Fact Sheet for Healthcare Providers:  IncredibleEmployment.be  This test is no t yet approved or cleared by the [place] FDA and  has been authorized for detection and/or diagnosis of SARS-CoV-2 by FDA under an Emergency Use Authorization (EUA). This EUA will remain  in effect (meaning this test can be used) for the duration of the COVID-19 declaration under Section 564(b)(1) of the Act, 21 U.S.C.section 360bbb-3(b)(1), unless the authorization is terminated  or revoked sooner.       Influenza A by PCR NEGATIVE NEGATIVE Final   Influenza B by PCR NEGATIVE NEGATIVE Final    Comment: (NOTE) The Xpert Xpress SARS-CoV-2/FLU/RSV plus assay is intended as an aid in the diagnosis of influenza from Nasopharyngeal swab specimens and should not be used as a sole basis for treatment. Nasal washings and aspirates are unacceptable for Xpert Xpress SARS-CoV-2/FLU/RSV testing.  Fact Sheet for Patients: EntrepreneurPulse.com.au  Fact Sheet for Healthcare Providers: IncredibleEmployment.be  This test is not yet approved or cleared by the [place] FDA and has been authorized for detection and/or diagnosis of SARS-CoV-2 by FDA under an Emergency Use Authorization (EUA). This EUA will remain in effect (meaning this test can be used) for the duration of the COVID-19 declaration under Section 564(b)(1) of the Act, 21 U.S.C. section 360bbb-3(b)(1), unless the authorization is terminated or revoked.  Performed at [facility], [address] [place], [place] [address]     Coagulation Studies: No results for input(s): LABPROT, INR in the last 72 hours.  Urinalysis: No results for input(s): COLORURINE, LABSPEC,  PHURINE, GLUCOSEU, HGBUR, BILIRUBINUR, KETONESUR, PROTEINUR, UROBILINOGEN, NITRITE, LEUKOCYTESUR in the last 72 hours.  Invalid input(s): APPERANCEUR    Imaging: No results found.   Medications:      amLODipine  10 mg Oral Daily   aspirin  81 mg Oral Daily   atorvastatin  80 mg Oral QPM   carvedilol  37.5 mg Oral BID WC   cinacalcet  120 mg Oral Daily   clopidogrel  75 mg Oral Daily   ferric citrate  420 mg Oral TID   gabapentin  600 mg Oral QHS   heparin  5,000 Units Subcutaneous Q8H   hydrALAZINE  100 mg Oral Q8H   insulin aspart  0-5 Units Subcutaneous QHS   insulin aspart  0-9 Units Subcutaneous TID WC   insulin glargine  13 Units Subcutaneous Q2200   isosorbide mononitrate  30 mg Oral Daily   patiromer  8.4 g Oral Daily   sertraline  150 mg Oral q AM   acetaminophen, albuterol, hydrALAZINE, hydrOXYzine, morphine injection, nitroGLYCERIN, nitroGLYCERIN, ondansetron (ZOFRAN) IV, ondansetron  Assessment/ Plan:  [name] is a 35 y.o.  male  with Type 1 dialbetes, CAD, PVD, depression, CVA and ESRD on dialysis , who was admitted to [facility] on 11/30/2020 for Hyperkalemia [E87.5] ESRD (end stage renal disease) on dialysis ([place]) [N18.6, Z99.2] Chest pain [R07.9] Nonspecific chest pain [R07.9]    End Stage Renal Disease on hemodialysis: with hyperkalemia. Emergent hemodialysis treatment yesterday. No acute indication for dialysis today.  - continue paitromer - low potassium diet.  - receiving dialysis today -Next treatment scheduled for Saturday  2. 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 Home regimen includes Novolog and lantus. Hemoglobin A1c of 12.4% - poor control.  Recent admission to [facility] last month for DKA.  Glucose stable at this time  3. Secondary Hyperparathyroidism:   - cinacalcet - Auryxia with meals.   4. Hypertension: 149/91. Home regimen of amlodipine, carvedilol, hydralazine.    LOS: 0    6/23/202212:08 PM

## 2020-12-16 NOTE — Plan of Care (Signed)
Received pt from ED with NAD, VSS, and no complaint of pain. Pt A&O x4. See flowsheets for detailed assessments.    Problem: Clinical Measurements: Goal: Ability to maintain clinical measurements within normal limits will improve 12/16/2020 0109 by Debara Pickett, RN Outcome: Progressing 12/16/2020 0108 by Debara Pickett, RN Outcome: Progressing Goal: Will remain free from infection 12/16/2020 0109 by Debara Pickett, RN Outcome: Progressing 12/16/2020 0108 by Debara Pickett, RN Outcome: Progressing Goal: Diagnostic test results will improve 12/16/2020 0109 by Debara Pickett, RN Outcome: Progressing 12/16/2020 0108 by Debara Pickett, RN Outcome: Progressing Goal: Respiratory complications will improve 12/16/2020 0109 by Debara Pickett, RN Outcome: Progressing 12/16/2020 0108 by Debara Pickett, RN Outcome: Progressing Goal: Cardiovascular complication will be avoided 12/16/2020 0109 by Debara Pickett, RN Outcome: Progressing 12/16/2020 0108 by Debara Pickett, RN Outcome: Progressing   Problem: Safety: Goal: Ability to remain free from injury will improve 12/16/2020 0109 by Debara Pickett, RN Outcome: Progressing 12/16/2020 0108 by Debara Pickett, RN Outcome: Progressing

## 2020-12-17 LAB — BASIC METABOLIC PANEL
Anion gap: 11 (ref 5–15)
BUN: 37 mg/dL — ABNORMAL HIGH (ref 6–20)
CO2: 30 mmol/L (ref 22–32)
Calcium: 8.7 mg/dL — ABNORMAL LOW (ref 8.9–10.3)
Chloride: 99 mmol/L (ref 98–111)
Creatinine, Ser: 6.72 mg/dL — ABNORMAL HIGH (ref 0.61–1.24)
GFR, Estimated: 10 mL/min — ABNORMAL LOW (ref >60)
Glucose, Bld: 31 mg/dL — CL (ref 70–99)
Potassium: 4.3 mmol/L (ref 3.5–5.1)
Sodium: 140 mmol/L (ref 135–145)

## 2020-12-17 LAB — GLUCOSE, CAPILLARY
Glucose-Capillary: 179 mg/dL — ABNORMAL HIGH (ref 70–99)
Glucose-Capillary: 26 mg/dL — CL (ref 70–99)
Glucose-Capillary: 194 mg/dL — ABNORMAL HIGH (ref 70–99)
Glucose-Capillary: 237 mg/dL — ABNORMAL HIGH (ref 70–99)

## 2020-12-17 MED ORDER — DEXTROSE 50 % IV SOLN
INTRAVENOUS | Status: AC
  Administered 2020-12-17: 25 g via INTRAVENOUS
  Filled 2020-12-17: qty 50

## 2020-12-17 MED ORDER — DEXTROSE 50 % IV SOLN: 25.0000 g | Freq: Once | INTRAVENOUS | Status: AC

## 2020-12-17 MED ORDER — LANTUS SOLOSTAR 100 UNIT/ML ~~LOC~~ SOPN: 13.0000 [IU] | PEN_INJECTOR | Freq: Every evening | SUBCUTANEOUS | 11 refills | Status: DC

## 2020-12-17 MED ORDER — ISOSORBIDE MONONITRATE ER 30 MG PO TB24: 30.0000 mg | ORAL_TABLET | Freq: Two times a day (BID) | ORAL | 0 refills | Status: DC

## 2020-12-17 MED ORDER — ISOSORBIDE MONONITRATE ER 30 MG PO TB24: 30.0000 mg | ORAL_TABLET | Freq: Every day | ORAL | 0 refills | Status: DC

## 2020-12-17 NOTE — Progress Notes (Addendum)
Inpatient Diabetes Program Recommendations  AACE/ADA: New Consensus Statement on Inpatient Glycemic Control (2015)  Target Ranges:  Prepandial:   less than 140 mg/dL      Peak postprandial:   less than 180 mg/dL (1-2 hours)      Critically ill patients:  140 - 180 mg/dL   Results for PRIMUS, MCNAMARA (MRN [id number]) as of 12/17/2020 07:02  Ref. Range 12/16/2020 07:45 12/16/2020 17:46 12/16/2020 20:59  Glucose-Capillary Latest Ref Range: 70 - 99 mg/dL 133 (H)  1 unit NOVOLOG  343 (H)  7 units NOVOLOG  275 (H)  3 units NOVOLOG '@23'$ :00  13 units LANTUS '@23'$ :00    Results for BENIAMIN, CALLISTE (MRN [id number]) as of 12/17/2020 07:02  Ref. Range 12/17/2020 05:53  Glucose-Capillary Latest Ref Range: 70 - 99 mg/dL 26 (LL)    Home DM Meds: Lantus 18 units QHS   Novolog 5 units TID    Current Orders: Lantus 13 units QHS              Novolog 0-9 units TID ac/hs      MD- Note Severe Hypoglycemia this AM.  Unsure if the Lantus was the cause or the Novolog pt received at 11pm for CBG of 275 at 9pm?  Please consider:  1. Reduce Lantus slightly to 10 units QHS  2. Stop the Bedtime Novolog SSi coverage  3. Start very low dose Novolog Meal Coverage: Novolog 2 units TID with meals  Hold if pt eats <50% of meal, Hold if pt NPO     --Will follow patient during hospitalization--  Wyn Quaker RN, MSN, CDE Diabetes Coordinator Inpatient Glycemic Control Team Team Pager: [phone] (8a-5p)

## 2020-12-17 NOTE — Progress Notes (Signed)
[facility]  ROUNDING NOTE   Subjective:    Patient had chest pain during dialysis treatment. Rapid response was called. He was given IV morphine and sublingual nitro which relieved his chest pain.   Hypoglycemic this morning to 26 Patient did not eat dinner last night Denies chest pain    Objective:  Vital signs in last 24 hours:  Temp:  [97.9 F (36.6 C)-99 F (37.2 C)] 98.9 F (37.2 C) (06/24 1146) Pulse Rate:  [71-98] 83 (06/24 1146) Resp:  [10-26] 18 (06/24 1146) BP: (102-184)/(52-100) 102/52 (06/24 1146) SpO2:  [96 %-100 %] 100 % (06/24 1146)  Weight change:  Filed Weights   12/14/20 0428 12/16/20 0426  Weight: 72.6 kg 67.5 kg    Intake/Output: I/O last 3 completed shifts: In: 720 [P.O.:720] Out: 1500 [Other:1500]   Intake/Output this shift:  No intake/output data recorded.  Physical Exam: General: NAD, laying in bed  Head: Normocephalic, atraumatic. Moist oral mucosal membranes  Eyes: Anicteric  Lungs:  Clear to auscultation, normal breathing effort  Chest Reproducible chest pain with palpation  Heart: Regular rate and rhythm  Abdomen:  Soft, nontender  Extremities:  no peripheral edema.  Neurologic: Nonfocal, moving all four extremities  Skin: No lesions  Access: Lt AVF    Basic Metabolic Panel: Recent Labs  Lab 12/14/20 0431 12/14/20 0559 12/15/20 0332 12/15/20 0812 12/16/20 0435 12/17/20 0414  NA 137 137 131*  --  135 140  K 7.1* 7.0* 5.4*  --  5.2* 4.3  CL 98 97* 90*  --  94* 99  CO2 '24 30 29  '$ --  29 30  GLUCOSE 287* 300* 195* 479* 168* 31*  BUN 52* 52* 41*  --  52* 37*  CREATININE 8.70* 8.94* 6.98*  --  8.97* 6.72*  CALCIUM 8.5* 9.3 8.3*  --  8.3* 8.7*  PHOS  --   --   --   --  3.6  --      Liver Function Tests: Recent Labs  Lab 12/14/20 0431 12/16/20 0435  AST 31  --   ALT 30  --   ALKPHOS 131*  --   BILITOT 1.0  --   PROT 7.2  --   ALBUMIN 3.4* 3.3*    No results for input(s): LIPASE, AMYLASE in the  last 168 hours. No results for input(s): AMMONIA in the last 168 hours.  CBC: Recent Labs  Lab 12/14/20 0431 12/16/20 0435  WBC 7.8 5.5  NEUTROABS 5.8 2.6  HGB 8.5* 7.8*  HCT 27.2* 24.4*  MCV 96.8 93.8  PLT 228 216     Cardiac Enzymes: No results for input(s): CKTOTAL, CKMB, CKMBINDEX, TROPONINI in the last 168 hours.  BNP: Invalid input(s): POCBNP  CBG: Recent Labs  Lab 12/16/20 2059 12/17/20 0553 12/17/20 0613 12/17/20 0742 12/17/20 1144  GLUCAP 275* 26* 179* 194* 237*     Microbiology: Results for orders placed or performed during the hospital encounter of 12/14/20  Resp Panel by RT-PCR (Flu A&B, Covid) Nasopharyngeal Swab     Status: None   Collection Time: 12/14/20  5:59 AM   Specimen: Nasopharyngeal Swab; Nasopharyngeal(NP) swabs in vial transport medium  Result Value Ref Range Status   SARS Coronavirus 2 by RT PCR NEGATIVE NEGATIVE Final    Comment: (NOTE) SARS-CoV-2 target nucleic acids are NOT DETECTED.  The SARS-CoV-2 RNA is generally detectable in upper respiratory specimens during the acute phase of infection. The lowest concentration of SARS-CoV-2 viral copies this assay can detect is  138 copies/mL. A negative result does not preclude SARS-Cov-2 infection and should not be used as the sole basis for treatment or other patient management decisions. A negative result may occur with  improper specimen collection/handling, submission of specimen other than nasopharyngeal swab, presence of viral mutation(s) within the areas targeted by this assay, and inadequate number of viral copies(<138 copies/mL). A negative result must be combined with clinical observations, patient history, and epidemiological information. The expected result is Negative.  Fact Sheet for Patients:  EntrepreneurPulse.com.au  Fact Sheet for Healthcare Providers:  IncredibleEmployment.be  This test is no t yet approved or cleared by the  [place] FDA and  has been authorized for detection and/or diagnosis of SARS-CoV-2 by FDA under an Emergency Use Authorization (EUA). This EUA will remain  in effect (meaning this test can be used) for the duration of the COVID-19 declaration under Section 564(b)(1) of the Act, 21 U.S.C.section 360bbb-3(b)(1), unless the authorization is terminated  or revoked sooner.       Influenza A by PCR NEGATIVE NEGATIVE Final   Influenza B by PCR NEGATIVE NEGATIVE Final    Comment: (NOTE) The Xpert Xpress SARS-CoV-2/FLU/RSV plus assay is intended as an aid in the diagnosis of influenza from Nasopharyngeal swab specimens and should not be used as a sole basis for treatment. Nasal washings and aspirates are unacceptable for Xpert Xpress SARS-CoV-2/FLU/RSV testing.  Fact Sheet for Patients: EntrepreneurPulse.com.au  Fact Sheet for Healthcare Providers: IncredibleEmployment.be  This test is not yet approved or cleared by the [place] FDA and has been authorized for detection and/or diagnosis of SARS-CoV-2 by FDA under an Emergency Use Authorization (EUA). This EUA will remain in effect (meaning this test can be used) for the duration of the COVID-19 declaration under Section 564(b)(1) of the Act, 21 U.S.C. section 360bbb-3(b)(1), unless the authorization is terminated or revoked.  Performed at [facility], [address] [place], [place] [address]     Coagulation Studies: No results for input(s): LABPROT, INR in the last 72 hours.  Urinalysis: No results for input(s): COLORURINE, LABSPEC, PHURINE, GLUCOSEU, HGBUR, BILIRUBINUR, KETONESUR, PROTEINUR, UROBILINOGEN, NITRITE, LEUKOCYTESUR in the last 72 hours.  Invalid input(s): APPERANCEUR    Imaging: No results found.   Medications:      amLODipine  10 mg Oral Daily   aspirin  81 mg Oral Daily   atorvastatin  80 mg Oral QPM   carvedilol  37.5 mg Oral BID WC    cinacalcet  120 mg Oral Daily   clopidogrel  75 mg Oral Daily   ferric citrate  420 mg Oral TID   gabapentin  600 mg Oral QHS   heparin  5,000 Units Subcutaneous Q8H   hydrALAZINE  100 mg Oral Q8H   insulin aspart  0-5 Units Subcutaneous QHS   insulin aspart  0-9 Units Subcutaneous TID WC   insulin glargine  13 Units Subcutaneous Q2200   isosorbide mononitrate  30 mg Oral Daily   sertraline  150 mg Oral q AM   acetaminophen, albuterol, hydrALAZINE, hydrOXYzine, morphine injection, nitroGLYCERIN, ondansetron (ZOFRAN) IV, ondansetron  Assessment/ Plan:  [name] is a 35 y.o.  male with Type 1 dialbetes, CAD, PVD, depression, CVA and ESRD on dialysis , who was admitted to [facility] on 11/30/2020 for Hyperkalemia [E87.5] ESRD (end stage renal disease) on dialysis ([place]) [N18.6, Z99.2] Chest pain [R07.9] Nonspecific chest pain [R07.9]    End Stage Renal Disease on hemodialysis: with hyperkalemia. Emergent hemodialysis treatment yesterday. No acute indication  for dialysis today.  - continue paitromer - low potassium diet.  - Received dialysis yesterday, tolerated well -Next treatment scheduled for Saturday  2. 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 Home regimen includes Novolog and lantus. Hemoglobin A1c of 12.4% - poor control.  Recent admission to [facility] last month for DKA.  Hypoglycemic this morning, treated and recovered  3. Secondary Hyperparathyroidism:   - cinacalcet - Auryxia with meals.   4. Hypertension: 142/95. Home regimen of amlodipine, carvedilol, hydralazine.    LOS: 1   6/24/20221:08 PM

## 2020-12-17 NOTE — Discharge Summary (Addendum)
Physician Discharge Summary  Raymond Lamb [id number] DOB: October 08, 1985 DOA: 12/14/2020  PCP: Norina Buzzard, MD  Admit date: 12/14/2020 Discharge date: 12/17/2020  Discharge disposition: Home   Recommendations for Outpatient Follow-Up:   Follow-up with PCP in 1 week Follow-up with cardiologist as scheduled Continue outpatient hemodialysis as scheduled   Discharge Diagnosis:   Principal Problem:   Chest pain Active Problems:   Anemia in ESRD (end-stage renal disease) ([place])   Depression with anxiety   ESRD (end stage renal disease) ([place])   History of CVA (cerebrovascular accident)   CAD (coronary artery disease)   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  Diet recommendation:  Diet Order             Diet - low sodium heart healthy           Diet renal 60/70-07-28-1198           Diet renal with fluid restriction Fluid restriction: 1200 mL Fluid; Room service appropriate? Yes; Fluid consistency: Thin  Diet effective now                     Code Status: Full Code     Hospital Course:   [name] is a 35 y.o. male with medical history significant for ESRD on hemodialysis on Tuesdays, Thursdays and Saturdays, hypertension, hyperlipidemia, brittle type 1 diabetes mellitus, stroke, anemia of chronic disease, PVD, CAD s/p heart catheterization in May 2022 which showed progression of proximal to mid RCA disease at 80% (medical therapy was recommended).  He presented to the hospital with chest pain and shortness of breath.   He was admitted to the hospital for observation.  He was found to have hyperkalemia with potassium of 7.1 associated with peaked T waves. Troponins were negative.  Cardiologist was consulted because of multiple cardiac risk factors.  Medical therapy was recommended.  He was started on isosorbide mononitrate to help  with angina.  He has brittle diabetes mellitus and he had hyperglycemia and hypoglycemia in the hospital.  He attributed hypoglycemia on the morning of 12/17/2020 to not eating well on the night of 12/16/2020 because he thought he was going to be discharged home.  His condition has improved and he is deemed stable for discharge to home.      Medical Consultants:   Cardiologist Nephrologist   Discharge Exam:    Vitals:   12/16/20 2035 12/17/20 0346 12/17/20 0741 12/17/20 1146  BP: (!) 162/95 (!) 164/84 (!) 142/95 (!) 102/52  Pulse: 93 81 71 83  Resp: '19 20 17 18  '$ Temp: 99 F (37.2 C) 98.3 F (36.8 C) 97.9 F (36.6 C) 98.9 F (37.2 C)  TempSrc: Oral Oral    SpO2: 100% 96% 100% 100%  Weight:      Height:         GEN: NAD SKIN: Warm and dry EYES: No pallor or icterus ENT: MMM CV: RRR PULM: CTA B ABD: soft, ND, NT, +BS CNS: AAO x 3, left upper extremity weakness with contracture EXT: No edema or tenderness   The results of significant diagnostics from this hospitalization (including imaging, microbiology, ancillary and laboratory) are listed below for reference.     Procedures and Diagnostic Studies:   DG Chest 1 View  Result Date: 12/14/2020 CLINICAL DATA:  EMS brings pt in for c/o CP; pt reports awoke PTA with  mid CP radiating into left arm unrelieved by NTG; hx MI AND CVA; denies any accomp symptoms EXAM: CHEST  1 VIEW COMPARISON:  Chest x-ray 10/28/2020 FINDINGS: The heart size and mediastinal contours are unchanged. No focal consolidation. No pulmonary edema. No pleural effusion. No pneumothorax. No acute osseous abnormality. IMPRESSION: No active disease. Electronically Signed   By: Iven Finn M.D.   On: 12/14/2020 04:54     Labs:   Basic Metabolic Panel: Recent Labs  Lab 12/14/20 0431 12/14/20 0559 12/15/20 0332 12/15/20 0812 12/16/20 0435 12/17/20 0414  NA 137 137 131*  --  135 140  K 7.1* 7.0* 5.4*  --  5.2* 4.3  CL 98 97* 90*  --  94* 99  CO2  '24 30 29  '$ --  29 30  GLUCOSE 287* 300* 195* 479* 168* 31*  BUN 52* 52* 41*  --  52* 37*  CREATININE 8.70* 8.94* 6.98*  --  8.97* 6.72*  CALCIUM 8.5* 9.3 8.3*  --  8.3* 8.7*  PHOS  --   --   --   --  3.6  --    GFR Estimated Creatinine Clearance: 14.4 mL/min (A) (by C-G formula based on SCr of 6.72 mg/dL (H)). Liver Function Tests: Recent Labs  Lab 12/14/20 0431 12/16/20 0435  AST 31  --   ALT 30  --   ALKPHOS 131*  --   BILITOT 1.0  --   PROT 7.2  --   ALBUMIN 3.4* 3.3*   No results for input(s): LIPASE, AMYLASE in the last 168 hours. No results for input(s): AMMONIA in the last 168 hours. Coagulation profile No results for input(s): INR, PROTIME in the last 168 hours.  CBC: Recent Labs  Lab 12/14/20 0431 12/16/20 0435  WBC 7.8 5.5  NEUTROABS 5.8 2.6  HGB 8.5* 7.8*  HCT 27.2* 24.4*  MCV 96.8 93.8  PLT 228 216   Cardiac Enzymes: No results for input(s): CKTOTAL, CKMB, CKMBINDEX, TROPONINI in the last 168 hours. BNP: Invalid input(s): POCBNP CBG: Recent Labs  Lab 12/16/20 2059 12/17/20 0553 12/17/20 0613 12/17/20 0742 12/17/20 1144  GLUCAP 275* 26* 179* 194* 237*   D-Dimer No results for input(s): DDIMER in the last 72 hours. Hgb A1c No results for input(s): HGBA1C in the last 72 hours. Lipid Profile Recent Labs    12/15/20 0332  CHOL 205*  HDL 38*  LDLCALC 136*  TRIG 155*  CHOLHDL 5.4   Thyroid function studies No results for input(s): TSH, T4TOTAL, T3FREE, THYROIDAB in the last 72 hours.  Invalid input(s): FREET3 Anemia work up No results for input(s): VITAMINB12, FOLATE, FERRITIN, TIBC, IRON, RETICCTPCT in the last 72 hours. Microbiology Recent Results (from the past 240 hour(s))  Resp Panel by RT-PCR (Flu A&B, Covid) Nasopharyngeal Swab     Status: None   Collection Time: 12/14/20  5:59 AM   Specimen: Nasopharyngeal Swab; Nasopharyngeal(NP) swabs in vial transport medium  Result Value Ref Range Status   SARS Coronavirus 2 by RT PCR  NEGATIVE NEGATIVE Final    Comment: (NOTE) SARS-CoV-2 target nucleic acids are NOT DETECTED.  The SARS-CoV-2 RNA is generally detectable in upper respiratory specimens during the acute phase of infection. The lowest concentration of SARS-CoV-2 viral copies this assay can detect is 138 copies/mL. A negative result does not preclude SARS-Cov-2 infection and should not be used as the sole basis for treatment or other patient management decisions. A negative result may occur with  improper specimen collection/handling, submission of specimen other  than nasopharyngeal swab, presence of viral mutation(s) within the areas targeted by this assay, and inadequate number of viral copies(<138 copies/mL). A negative result must be combined with clinical observations, patient history, and epidemiological information. The expected result is Negative.  Fact Sheet for Patients:  EntrepreneurPulse.com.au  Fact Sheet for Healthcare Providers:  IncredibleEmployment.be  This test is no t yet approved or cleared by the [place] FDA and  has been authorized for detection and/or diagnosis of SARS-CoV-2 by FDA under an Emergency Use Authorization (EUA). This EUA will remain  in effect (meaning this test can be used) for the duration of the COVID-19 declaration under Section 564(b)(1) of the Act, 21 U.S.C.section 360bbb-3(b)(1), unless the authorization is terminated  or revoked sooner.       Influenza A by PCR NEGATIVE NEGATIVE Final   Influenza B by PCR NEGATIVE NEGATIVE Final    Comment: (NOTE) The Xpert Xpress SARS-CoV-2/FLU/RSV plus assay is intended as an aid in the diagnosis of influenza from Nasopharyngeal swab specimens and should not be used as a sole basis for treatment. Nasal washings and aspirates are unacceptable for Xpert Xpress SARS-CoV-2/FLU/RSV testing.  Fact Sheet for Patients: EntrepreneurPulse.com.au  Fact Sheet for  Healthcare Providers: IncredibleEmployment.be  This test is not yet approved or cleared by the [place] FDA and has been authorized for detection and/or diagnosis of SARS-CoV-2 by FDA under an Emergency Use Authorization (EUA). This EUA will remain in effect (meaning this test can be used) for the duration of the COVID-19 declaration under Section 564(b)(1) of the Act, 21 U.S.C. section 360bbb-3(b)(1), unless the authorization is terminated or revoked.  Performed at [facility], [address] [place], [place] [address]      Discharge Instructions:   Discharge Instructions     Diet - low sodium heart healthy   Complete by: As directed    Diet renal 60/70-07-28-1198   Complete by: As directed    Increase activity slowly   Complete by: As directed       Allergies as of 12/17/2020       Reactions   Baclofen Itching, Other (See Comments)   Became disoriented, emesis   Iodinated Diagnostic Agents Hives   Lisinopril Swelling   Other Swelling   Oxybenzone    Other reaction(s): Unknown        Medication List     TAKE these medications    amLODipine 10 MG tablet Commonly known as: NORVASC Take 10 mg by mouth daily.   aspirin 81 MG chewable tablet Chew 81 mg by mouth daily.   atorvastatin 80 MG tablet Commonly known as: LIPITOR Take 80 mg by mouth every evening.   Auryxia 1 GM 210 MG(Fe) tablet Generic drug: ferric citrate Take 420 mg by mouth 3 (three) times daily.   carvedilol 25 MG tablet Commonly known as: COREG Take 37.5 mg by mouth in the morning and at bedtime.   cinacalcet 60 MG tablet Commonly known as: SENSIPAR Take 120 mg by mouth daily.   clopidogrel 75 MG tablet Commonly known as: PLAVIX Take 75 mg by mouth daily.   ergocalciferol 1.25 MG (50000 UT) capsule Commonly known as: VITAMIN D2 Take 50,000 Units by mouth once a week.   gabapentin 300 MG capsule Commonly known as: NEURONTIN Take 600 mg by  mouth at bedtime.   hydrALAZINE 50 MG tablet Commonly known as: APRESOLINE Take 100 mg by mouth every 8 (eight) hours.   hydrOXYzine 10 MG tablet Commonly known as: ATARAX/VISTARIL Take  10 mg by mouth 3 (three) times daily as needed.   isosorbide mononitrate 30 MG 24 hr tablet Commonly known as: IMDUR Take 1 tablet (30 mg total) by mouth 2 (two) times daily.   Lantus SoloStar 100 UNIT/ML Solostar Pen Generic drug: insulin glargine Inject 13 Units into the skin at bedtime. What changed: how much to take   nitroGLYCERIN 0.4 MG SL tablet Commonly known as: NITROSTAT Place 1 tablet under the tongue daily as needed.   NovoLOG FlexPen 100 UNIT/ML FlexPen Generic drug: insulin aspart Inject 5 Units into the skin 3 (three) times daily. Use per sliding scale no more then 50 units a day   sertraline 100 MG tablet Commonly known as: ZOLOFT Take 150 mg by mouth in the morning.          Time coordinating discharge: 32 minutes  Signed:  Franchon Ketterman  [facility] 12/17/2020, 12:38 PM   Pager on [email or web address]. If 7PM-7AM, please contact night-coverage at [email or web address]

## 2020-12-17 NOTE — TOC Initial Note (Signed)
Transition of Care [facility]) - Initial/Assessment Note    Patient Details  Name: Raymond Lamb MRN: [id number] Date of Birth: Oct 19, 1985  Transition of Care [facility]) CM/SW Contact:    Kerin Salen, RN Phone Number: 12/17/2020, 1:14 PM  Clinical Narrative:  Spoke with patient, who says he lives with mother and step-father just recently moved to [facility]. Patient says he drives and able to shop and cook, although mother does most of the cooking. Patient states he is on HD T/Th/Sat. And uses walker or wheelchair for Ambulation. Patient states he get PC at the [facility] clinic. Patient states he has had [facility]/OT in the past, could not remember which agency and ask that I speak with his mother. Patient states he has a four prong cane, 2 rolling walkers, wheelchair and BSC, therefore he feels he has no other needs at this time.  Called and spoke with Mother Vickey Huger, who says patient uses [facility] for [facility] services however not seen since early this month. Mom feels patient still need service, I advised her to speak with PCP for order. Mom says next PCP visit Wed. 12/22/20 and she will consult with PCP to get order. Mother on her way to transport patient home.   TOC needs resolved.                 Expected Discharge Plan: Home/Self Care Barriers to Discharge: Barriers Resolved   Patient Goals and CMS Choice Patient states their goals for this hospitalization and ongoing recovery are:: To return home, lives with Mom and Step-father.   Choice offered to / list presented to : NA  Expected Discharge Plan and Services Expected Discharge Plan: Home/Self Care   Discharge Planning Services: Follow-up appt scheduled   Living arrangements for the past 2 months: Single Family Home Expected Discharge Date: 12/17/20               DME Arranged: N/A DME Agency: NA       HH Arranged: NA HH Agency: NA        Prior Living Arrangements/Services Living arrangements for the past 2 months:  [place] Lives with:: Parents Patient language and need for interpreter reviewed:: Yes Do you feel safe going back to the place where you live?: Yes      Need for Family Participation in Patient Care: Yes (Comment) Care giver support system in place?: Yes (comment)   Criminal Activity/Legal Involvement Pertinent to Current Situation/Hospitalization: No - Comment as needed  Activities of Daily Living Home Assistive Devices/Equipment: Walker (specify type) ADL Screening (condition at time of admission) Patient's cognitive ability adequate to safely complete daily activities?: Yes Is the patient deaf or have difficulty hearing?: No Does the patient have difficulty seeing, even when wearing glasses/contacts?: No Does the patient have difficulty concentrating, remembering, or making decisions?: No Patient able to express need for assistance with ADLs?: Yes Does the patient have difficulty dressing or bathing?: No Independently performs ADLs?: Yes (appropriate for developmental age) Does the patient have difficulty walking or climbing stairs?: Yes Weakness of Legs: Left Weakness of Arms/Hands: Left  Permission Sought/Granted Permission sought to share information with : Case Manager Permission granted to share information with : Yes, Verbal Permission Granted  Share Information with NAME: Mother, Vickey Huger           Emotional Assessment Appearance:: Appears stated age Attitude/Demeanor/Rapport: Engaged Affect (typically observed): Stable Orientation: : Oriented to Self, Oriented to Place, Oriented to  Time, Oriented to  Situation Alcohol / Substance Use: Not Applicable Psych Involvement: No (comment)  Admission diagnosis:  Hyperkalemia [E87.5] ESRD (end stage renal disease) on dialysis ([place]) [N18.6, Z99.2] Chest pain [R07.9] Nonspecific chest pain [R07.9] Patient Active Problem List   Diagnosis Date Noted   Chest pain 12/14/2020   Type I diabetes mellitus with  renal manifestations, uncontrolled ([place]) 12/14/2020   Hyperkalemia 12/14/2020   HLD (hyperlipidemia) 12/14/2020   HTN (hypertension) 12/14/2020   ESRD (end stage renal disease) on dialysis [facility])    Hypoglycemia 12/01/2020   CAD (coronary artery disease) 11/16/2020   Depression with anxiety 06/22/2016   History of CVA (cerebrovascular accident) 06/22/2016   ESRD (end stage renal disease) ([place]) 12/04/2015   PVD (peripheral vascular disease) ([place]) 12/04/2015   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

## 2021-03-18 ENCOUNTER — Observation Stay
Admission: EM | Admit: 2021-03-18 | Discharge: 2021-03-19 | Disposition: A | Discharge status: Home / Self Care | Payer: Medicare HMO | Attending: Internal Medicine | Admitting: Internal Medicine

## 2021-03-18 ENCOUNTER — Emergency Department: Payer: Medicare HMO

## 2021-03-18 DIAGNOSIS — N186 End stage renal disease: Secondary | ICD-10-CM | POA: Diagnosis present

## 2021-03-18 DIAGNOSIS — Z20822 Contact with and (suspected) exposure to covid-19: Secondary | ICD-10-CM | POA: Insufficient documentation

## 2021-03-18 DIAGNOSIS — E872 Acidosis: Secondary | ICD-10-CM

## 2021-03-18 DIAGNOSIS — R059 Cough, unspecified: Secondary | ICD-10-CM

## 2021-03-18 DIAGNOSIS — Z7902 Long term (current) use of antithrombotics/antiplatelets: Secondary | ICD-10-CM | POA: Diagnosis not present

## 2021-03-18 DIAGNOSIS — Z79899 Other long term (current) drug therapy: Secondary | ICD-10-CM | POA: Diagnosis not present

## 2021-03-18 DIAGNOSIS — I2510 Atherosclerotic heart disease of native coronary artery without angina pectoris: Secondary | ICD-10-CM | POA: Diagnosis present

## 2021-03-18 DIAGNOSIS — R197 Diarrhea, unspecified: Secondary | ICD-10-CM

## 2021-03-18 DIAGNOSIS — R079 Chest pain, unspecified: Secondary | ICD-10-CM | POA: Diagnosis present

## 2021-03-18 DIAGNOSIS — R0602 Shortness of breath: Secondary | ICD-10-CM | POA: Insufficient documentation

## 2021-03-18 DIAGNOSIS — F17210 Nicotine dependence, cigarettes, uncomplicated: Secondary | ICD-10-CM | POA: Insufficient documentation

## 2021-03-18 DIAGNOSIS — E8729 Other acidosis: Secondary | ICD-10-CM

## 2021-03-18 DIAGNOSIS — Z794 Long term (current) use of insulin: Secondary | ICD-10-CM | POA: Diagnosis not present

## 2021-03-18 DIAGNOSIS — Z7982 Long term (current) use of aspirin: Secondary | ICD-10-CM | POA: Insufficient documentation

## 2021-03-18 DIAGNOSIS — E1022 Type 1 diabetes mellitus with diabetic chronic kidney disease: Secondary | ICD-10-CM | POA: Diagnosis not present

## 2021-03-18 DIAGNOSIS — I120 Hypertensive chronic kidney disease with stage 5 chronic kidney disease or end stage renal disease: Secondary | ICD-10-CM | POA: Insufficient documentation

## 2021-03-18 DIAGNOSIS — E875 Hyperkalemia: Principal | ICD-10-CM | POA: Diagnosis present

## 2021-03-18 DIAGNOSIS — E1010 Type 1 diabetes mellitus with ketoacidosis without coma: Secondary | ICD-10-CM | POA: Diagnosis present

## 2021-03-18 DIAGNOSIS — D631 Anemia in chronic kidney disease: Secondary | ICD-10-CM | POA: Diagnosis present

## 2021-03-18 DIAGNOSIS — E1029 Type 1 diabetes mellitus with other diabetic kidney complication: Secondary | ICD-10-CM | POA: Diagnosis present

## 2021-03-18 DIAGNOSIS — I10 Essential (primary) hypertension: Secondary | ICD-10-CM | POA: Diagnosis present

## 2021-03-18 DIAGNOSIS — Z8673 Personal history of transient ischemic attack (TIA), and cerebral infarction without residual deficits: Secondary | ICD-10-CM | POA: Insufficient documentation

## 2021-03-18 DIAGNOSIS — R112 Nausea with vomiting, unspecified: Secondary | ICD-10-CM

## 2021-03-18 DIAGNOSIS — IMO0002 Reserved for concepts with insufficient information to code with codable children: Secondary | ICD-10-CM | POA: Diagnosis present

## 2021-03-18 DIAGNOSIS — E785 Hyperlipidemia, unspecified: Secondary | ICD-10-CM | POA: Diagnosis present

## 2021-03-18 LAB — CBC WITH DIFFERENTIAL/PLATELET
Abs Immature Granulocytes: 0.05 10*3/uL (ref 0.00–0.07)
Basophils Absolute: 0.1 10*3/uL (ref 0.0–0.1)
Basophils Relative: 1 %
Eosinophils Absolute: 0.3 10*3/uL (ref 0.0–0.5)
Eosinophils Relative: 3 %
HCT: 41.5 % (ref 39.0–52.0)
Hemoglobin: 13.7 g/dL (ref 13.0–17.0)
Immature Granulocytes: 1 %
Lymphocytes Relative: 18 %
Lymphs Abs: 1.8 10*3/uL (ref 0.7–4.0)
MCH: 29.9 pg (ref 26.0–34.0)
MCHC: 33 g/dL (ref 30.0–36.0)
MCV: 90.6 fL (ref 80.0–100.0)
Monocytes Absolute: 0.8 10*3/uL (ref 0.1–1.0)
Monocytes Relative: 9 %
Neutro Abs: 6.7 10*3/uL (ref 1.7–7.7)
Neutrophils Relative %: 68 %
Platelets: 189 10*3/uL (ref 150–400)
RBC: 4.58 MIL/uL (ref 4.22–5.81)
RDW: 18.2 % — ABNORMAL HIGH (ref 11.5–15.5)
WBC: 9.8 10*3/uL (ref 4.0–10.5)
nRBC: 0 % (ref 0.0–0.2)

## 2021-03-18 LAB — COMPREHENSIVE METABOLIC PANEL
ALT: 14 U/L (ref 0–44)
AST: 13 U/L — ABNORMAL LOW (ref 15–41)
Albumin: 3.7 g/dL (ref 3.5–5.0)
Alkaline Phosphatase: 181 U/L — ABNORMAL HIGH (ref 38–126)
Anion gap: 29 — ABNORMAL HIGH (ref 5–15)
BUN: 70 mg/dL — ABNORMAL HIGH (ref 6–20)
CO2: 15 mmol/L — ABNORMAL LOW (ref 22–32)
Calcium: 8.6 mg/dL — ABNORMAL LOW (ref 8.9–10.3)
Chloride: 89 mmol/L — ABNORMAL LOW (ref 98–111)
Creatinine, Ser: 13.94 mg/dL — ABNORMAL HIGH (ref 0.61–1.24)
GFR, Estimated: 4 mL/min — ABNORMAL LOW (ref >60)
Glucose, Bld: 288 mg/dL — ABNORMAL HIGH (ref 70–99)
Potassium: 6.2 mmol/L — ABNORMAL HIGH (ref 3.5–5.1)
Sodium: 133 mmol/L — ABNORMAL LOW (ref 135–145)
Total Bilirubin: 1.5 mg/dL — ABNORMAL HIGH (ref 0.3–1.2)
Total Protein: 8.1 g/dL (ref 6.5–8.1)

## 2021-03-18 LAB — BRAIN NATRIURETIC PEPTIDE: B Natriuretic Peptide: 39.1 pg/mL (ref 0.0–100.0)

## 2021-03-18 LAB — TROPONIN I (HIGH SENSITIVITY)
Troponin I (High Sensitivity): 18 ng/L — ABNORMAL HIGH (ref <18)
Troponin I (High Sensitivity): 18 ng/L — ABNORMAL HIGH (ref <18)

## 2021-03-18 LAB — RESP PANEL BY RT-PCR (FLU A&B, COVID) ARPGX2
Influenza A by PCR: NEGATIVE
Influenza B by PCR: NEGATIVE
SARS Coronavirus 2 by RT PCR: NEGATIVE

## 2021-03-18 LAB — PROCALCITONIN: Procalcitonin: 0.42 ng/mL

## 2021-03-18 LAB — CBG MONITORING, ED: Glucose-Capillary: 269 mg/dL — ABNORMAL HIGH (ref 70–99)

## 2021-03-18 MED ORDER — SODIUM ZIRCONIUM CYCLOSILICATE 5 G PO PACK
15.0000 g | PACK | ORAL | Status: AC
  Administered 2021-03-18: 15 g via ORAL
  Filled 2021-03-18: qty 1

## 2021-03-18 MED ORDER — CALCIUM GLUCONATE-NACL 1-0.675 GM/50ML-% IV SOLN
1.0000 g | Freq: Once | INTRAVENOUS | Status: AC
  Administered 2021-03-18: 1000 mg via INTRAVENOUS
  Filled 2021-03-18: qty 50

## 2021-03-18 MED ORDER — CHLORHEXIDINE GLUCONATE CLOTH 2 % EX PADS
6.0000 | MEDICATED_PAD | Freq: Every day | CUTANEOUS | Status: DC
  Filled 2021-03-18: qty 6

## 2021-03-18 MED ORDER — NITROGLYCERIN 0.4 MG SL SUBL: 0.4000 mg | SUBLINGUAL_TABLET | SUBLINGUAL | Status: DC | PRN

## 2021-03-18 MED ORDER — ONDANSETRON HCL 4 MG/2ML IJ SOLN
4.0000 mg | Freq: Once | INTRAMUSCULAR | Status: AC
  Administered 2021-03-18: 4 mg via INTRAVENOUS

## 2021-03-18 MED ORDER — SODIUM BICARBONATE 8.4 % IV SOLN
50.0000 meq | Freq: Once | INTRAVENOUS | Status: AC
  Administered 2021-03-18: 50 meq via INTRAVENOUS
  Filled 2021-03-18: qty 50

## 2021-03-18 MED ORDER — SODIUM BICARBONATE 8.4 % IV SOLN: 50.0000 meq | Freq: Once | INTRAVENOUS | Status: DC

## 2021-03-18 MED ORDER — INSULIN ASPART 100 UNIT/ML IJ SOLN
0.0000 [IU] | INTRAMUSCULAR | Status: DC
  Administered 2021-03-18 – 2021-03-19 (×2): 8 [IU] via SUBCUTANEOUS
  Filled 2021-03-18 (×3): qty 1

## 2021-03-18 MED ORDER — LABETALOL HCL 5 MG/ML IV SOLN
10.0000 mg | Freq: Once | INTRAVENOUS | Status: AC
  Administered 2021-03-18: 10 mg via INTRAVENOUS
  Filled 2021-03-18: qty 4

## 2021-03-18 NOTE — ED Provider Notes (Signed)
[facility] Emergency Department Provider Note  ____________________________________________   Event Date/Time   First MD Initiated Contact with Patient 03/18/21 2025     (approximate)  I have reviewed the triage vital signs and the nursing notes.   HISTORY  Chief Complaint Chest Pain (Left side previous stroke, cp x 1 day , recent tooth infection)   HPI Raymond Iwen is a 35 y.o. male with medical history significant for ESRD on hemodialysis on Tuesdays, Thursdays and Saturdays, hypertension, hyperlipidemia, brittle type 1 diabetes mellitus, , anemia of chronic disease, PVD, CAD s/p heart catheterization in May 2022 which showed progression of proximal to mid RCA disease at 80% and CVA with left hemibody weakness who presents EMS from home for assessment of some left-sided chest pain that he states started couple hours prior to arrival.  He did receive 325 mg ASA with EMS.  Supervisor concerned and states that nitro did not help.  He states that since yesterday he has had a cough with some diarrhea and feeling a little short of breath.  He typically goes to dialysis Tuesday Thursday Saturday and was last dialyzed last on 9/20 but did not call yesterday on the 22nd.  He denies any hemoptysis, blood in stool, abdominal pain states that he does feel achy in his mid back.  No new headache, earache, sore throat, rash or extremity pain.  No falls or injuries.  He denies any illicit drug use or EtOH or tobacco abuse recently.         Past Medical History:  Diagnosis Date   Anemia of chronic disease    CAD (coronary artery disease)    a. 11/2015 Cath: LM nl, LAD 30p, LCX nl, OM2 70-80, RCA 50-80m b. 08/2020 MV: EF 56%. No isch/infarct; c. 10/2020 NSTEMI in setting of DKA-->Cath: LM nl, LAD nl, D2 70, LCX nl, OM2 70, OM3 70 inf branch, RCA 80p/m-->Med rx.   ESRD (end stage renal disease) ([facility]    Essential hypertension    History of echocardiogram    a. 10/2020 Echo:  EF >55%, nl RV fxn.   Mixed hyperlipidemia    PVD (peripheral vascular disease) ([facility]    a. 07/2015 PTA/Stenting RSFA; b. 12/2015 LLE Fem->PTA bypass; c. 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 04/2020 Duplex: patent LLE bypass. Mild prox RSFA stenosis. Stable ABIs.   Stroke ([facility]    Tobacco abuse    Type 1 diabetes ([facility]     Patient Active Problem List   Diagnosis Date Noted   Chest pain 12/14/2020   Type I diabetes mellitus with renal manifestations, uncontrolled ([facility] 12/14/2020   Hyperkalemia 12/14/2020   HLD (hyperlipidemia) 12/14/2020   HTN (hypertension) 12/14/2020   ESRD (end stage renal disease) on dialysis ([facility]    Hypoglycemia 12/01/2020   CAD (coronary artery disease) 11/16/2020   Depression with anxiety 06/22/2016   History of CVA (cerebrovascular accident) 06/22/2016   ESRD (end stage renal disease) ([facility] 12/04/2015   PVD [place]ipheral vascular disease) ([facility] 12/04/2015   Anemia in ESRD (end-stage renal disease) ([facility] 03/22/2015   Type 1 diabetes mellitus with hypoglycemia without coma ([facility] 03/21/2015    Past Surgical History:  Procedure Laterality Date   AVF Left     Prior to Admission medications   Medication Sig Start Date End Date Taking? Authorizing Provider  amLODipine (NORVASC) 10 MG tablet Take 10 mg by mouth daily. 09/19/20   [provider]  aspirin 81 MG chewable tablet Chew 81 mg  by mouth daily. 11/25/20   [provider]  atorvastatin (LIPITOR) 80 MG tablet Take 80 mg by mouth every evening. 09/29/20   [provider]  AURYXIA 1 GM 210 MG(Fe) tablet Take 420 mg by mouth 3 (three) times daily. 10/25/20   [provider]  carvedilol (COREG) 25 MG tablet Take 37.5 mg by mouth in the morning and at bedtime. 11/25/20   [provider]  cinacalcet (SENSIPAR) 60 MG tablet Take 120 mg by mouth daily. 11/08/20   [provider]  clopidogrel (PLAVIX) 75 MG tablet Take 75 mg by mouth daily. 08/30/20   [provider]  ergocalciferol (VITAMIN D2) 1.25 MG (50000 UT) capsule Take 50,000 Units by mouth once a week. 08/11/20   [provider]  gabapentin (NEURONTIN) 300 MG capsule Take 600 mg by mouth at bedtime. 09/28/20   [provider]  hydrALAZINE (APRESOLINE) 50 MG tablet Take 100 mg by mouth every 8 (eight) hours. 08/31/20   [provider]  hydrOXYzine (ATARAX/VISTARIL) 10 MG tablet Take 10 mg by mouth 3 (three) times daily as needed. 08/07/20   [provider]  isosorbide mononitrate (IMDUR) 30 MG 24 hr tablet Take 1 tablet (30 mg total) by mouth 2 (two) times daily. 12/17/20   Jennye Boroughs, MD  LANTUS SOLOSTAR 100 UNIT/ML Solostar Pen Inject 13 Units into the skin at bedtime. 12/17/20   Jennye Boroughs, MD  nitroGLYCERIN (NITROSTAT) 0.4 MG SL tablet Place 1 tablet under the tongue daily as needed. 11/20/20   [provider]  NOVOLOG FLEXPEN 100 UNIT/ML FlexPen Inject 5 Units into the skin 3 (three) times daily. Use per sliding scale no more then 50 units a day 12/02/20   Nolberto Hanlon, MD  sertraline (ZOLOFT) 100 MG tablet Take 150 mg by mouth in the morning. 11/25/20   [provider]    Allergies Baclofen, Iodinated diagnostic agents, Lisinopril, Other, and Oxybenzone  Family History  Problem Relation Age of Onset   Diabetes type I Other    Clotting disorder Neg Hx     Social History Social History   Tobacco Use   Smoking status: Every Day    Packs/day: 0.30    Years: 20.00    Pack years: 6.00    Types: Cigarettes   Smokeless tobacco: Never   Tobacco comments:    Currently smoking about six cigarettes/day.  Vaping Use   Vaping Use: Never used  Substance Use Topics   Alcohol use: Never   Drug use: Yes    Types: Marijuana    Comment: rarely    Review of Systems  Review of Systems  Constitutional:  Positive for chills and malaise/fatigue. Negative for fever.  HENT:  Negative for sore throat.   Eyes:  Negative for pain.   Respiratory:  Positive for cough and shortness of breath. Negative for stridor.   Cardiovascular:  Positive for chest pain.  Gastrointestinal:  Positive for nausea. Negative for vomiting.  Genitourinary:  Negative for dysuria.  Musculoskeletal:  Positive for back pain.  Skin:  Negative for rash.  Neurological:  Negative for seizures, loss of consciousness and headaches.  Psychiatric/Behavioral:  Negative for suicidal ideas.   All other systems reviewed and are negative.   Left hemibody is slightly contracted.  Patient has full strength in his right hemibody and cranial nerves II through XII grossly intact. ____________________________________________   PHYSICAL EXAM:  VITAL SIGNS: ED Triage Vitals  Enc Vitals Group     BP  Pulse      Resp      Temp      Temp src      SpO2      Weight      Height      Head Circumference      Peak Flow      Pain Score      Pain Loc      Pain Edu?      Excl. in [place]?    Vitals:   03/18/21 2027  BP: (!) 147/90  Pulse: (!) 110  Resp: 18  Temp: 98.9 F (37.2 C)  SpO2: 98%   Physical Exam Vitals and nursing note reviewed.  Constitutional:      Appearance: He is well-developed.  HENT:     Head: Normocephalic and atraumatic.     Right Ear: External ear normal.     Left Ear: External ear normal.     Nose: Nose normal.  Eyes:     Conjunctiva/sclera: Conjunctivae normal.  Cardiovascular:     Rate and Rhythm: Regular rhythm. Tachycardia present.     Heart sounds: No murmur heard. Pulmonary:     Effort: Pulmonary effort is normal. No respiratory distress.     Breath sounds: Normal breath sounds.  Abdominal:     Palpations: Abdomen is soft.     Tenderness: There is no abdominal tenderness.  Musculoskeletal:     Cervical back: Neck supple.  Skin:    General: Skin is warm and dry.  Neurological:     Mental Status: He is alert and oriented to person, place, and time.     ____________________________________________    LABS (all labs ordered are listed, but only abnormal results are displayed)  Labs Reviewed  CBC WITH DIFFERENTIAL/PLATELET - Abnormal; Notable for the following components:      Result Value   RDW 18.2 (*)    All other components within normal limits  COMPREHENSIVE METABOLIC PANEL - Abnormal; Notable for the following components:   Sodium 133 (*)    Potassium 6.2 (*)    Chloride 89 (*)    CO2 15 (*)    Glucose, Bld 288 (*)    BUN 70 (*)    Creatinine, Ser 13.94 (*)    Calcium 8.6 (*)    AST 13 (*)    Alkaline Phosphatase 181 (*)    Total Bilirubin 1.5 (*)    GFR, Estimated 4 (*)    Anion gap 29 (*)    All other components within normal limits  CBG MONITORING, ED - Abnormal; Notable for the following components:   Glucose-Capillary 269 (*)    All other components within normal limits  TROPONIN I (HIGH SENSITIVITY) - Abnormal; Notable for the following components:   Troponin I (High Sensitivity) 18 (*)    All other components within normal limits  RESP PANEL BY RT-PCR (FLU A&B, COVID) ARPGX2  BRAIN NATRIURETIC PEPTIDE  PROCALCITONIN  HEMOGLOBIN A1C  TROPONIN I (HIGH SENSITIVITY)   ____________________________________________  EKG  ECG shows sinus tachycardia with ventricular rate of 111, unremarkable intervals fairly tall T waves in V3 without other appearance of acute ischemia or significant arrhythmia. ____________________________________________  RADIOLOGY  ED MD interpretation: Chest x-ray has no overt edema, focal consolidation, effusion, pneumothorax or other clear intrathoracic process.  Official radiology report(s): DG Chest 1 View  Result Date: 03/18/2021 CLINICAL DATA:  Chest pain. EXAM: CHEST  1 VIEW COMPARISON:  December 14, 2020. FINDINGS: The heart size and mediastinal contours are  within normal limits. Both lungs are clear. The visualized skeletal structures are unremarkable. IMPRESSION: No active disease. Electronically Signed   By: Marijo Conception M.D.    On: 03/18/2021 21:00    ____________________________________________   PROCEDURES  Procedure(s) performed (including Critical Care):  .Critical Care Performed by: Lucrezia Starch, MD Authorized by: Lucrezia Starch, MD   Critical care provider statement:    Critical care time (minutes):  45   Critical care was necessary to treat or prevent imminent or life-threatening deterioration of the following conditions:  Metabolic crisis and renal failure (hyperkalemia requiring Ca, Bicarb, insulin and lokelma and admission)   Critical care was time spent personally by me on the following activities:  Discussions with consultants, evaluation of patient's response to treatment, examination of patient, ordering and performing treatments and interventions, ordering and review of laboratory studies, ordering and review of radiographic studies, pulse oximetry, re-evaluation of patient's condition, obtaining history from patient or surrogate and review of old charts   ____________________________________________   INITIAL IMPRESSION / ASSESSMENT AND PLAN / ED COURSE      Presents with above-stated history exam for assessment of some left-sided chest pain that started today in the setting of some nonproductive cough, shortness of breath, nonbloody nonbilious vomiting, and some diarrhea that he states are yesterday.  Patient did recently miss dialysis session.  On arrival he is tachycardic and slightly hypertensive with a BP of 147/90 with otherwise stable vital signs on room air.  Differential includes ACS, acute heart failure or volume overload related to recent missed dialysis, pericarditis, myocarditis, and viral bronchitis and gastroenteritis.  He has no new hypoxia and have a lower suspicion for PE at this time.  Chest x-ray has no overt edema, focal consolidation, effusion, pneumothorax or other clear intrathoracic process.  ECG does not appear ischemic but does have some peaked T waves.   Troponin is at the upper limit of normal at 18 and less consistent with ACS or myocarditis. BNP is double digits and overall patient does not appear grossly volume overloaded.  COVID influenza PCR is negative.  CBC shows no leukocytosis or acute anemia.  CMP remarkable for K of 6.2, bicarb of 15, glucose of 288 and a BUN of 70 with a creatinine of 13.94 and anion gap of 29.  No other significant electrolyte or metabolic derangements.  Suspect patient's uremia is likely the source of his acidosis.  Given peaked T waves on EKG with hyperkalemia did treat patient with calcium insulin and bicarb.  Discussed with on-call nephrologist Dr. Candiss Norse who also recommended [facility] and plan for hospital admission with dialysis first thing, morning.  I will plan to admit to medicine service for further evaluation and management.      ____________________________________________   FINAL CLINICAL IMPRESSION(S) / ED DIAGNOSES  Final diagnoses:  Chest pain, unspecified type  Nausea vomiting and diarrhea  Cough  Hyperkalemia  High anion gap metabolic acidosis  ESRD (end stage renal disease) (HCC)    Medications  nitroGLYCERIN (NITROSTAT) SL tablet 0.4 mg (has no administration in time range)  insulin aspart (novoLOG) injection 0-15 Units (8 Units Subcutaneous Given 03/18/21 2200)  sodium bicarbonate injection 50 mEq (50 mEq Intravenous Not Given 03/18/21 2205)  ondansetron (ZOFRAN) injection 4 mg (4 mg Intravenous Given 03/18/21 2048)  labetalol (NORMODYNE) injection 10 mg (10 mg Intravenous Given 03/18/21 2128)  calcium gluconate 1 g/ 50 mL sodium chloride IVPB (0 mg Intravenous Stopped 03/18/21 2209)  sodium zirconium cyclosilicate (LOKELMA) packet  15 g (15 g Oral Given 03/18/21 2212)  sodium bicarbonate injection 50 mEq (50 mEq Intravenous Given 03/18/21 2200)     ED Discharge Orders     None        Note:  This document was prepared using Dragon voice recognition software and may include  unintentional dictation errors.    Lucrezia Starch, MD 03/18/21 2222

## 2021-03-18 NOTE — ED Triage Notes (Signed)
Cp x 1 day, left side stroke history, recent tooth infection

## 2021-03-18 NOTE — ED Notes (Signed)
Pt report BG of 245 from self monitor. Pt did not want to finger stick.

## 2021-03-18 NOTE — ED Notes (Signed)
Pt co of tooth pain not cp at this time

## 2021-03-18 NOTE — H&P (Signed)
History and Physical   Raymond Lamb [id number] DOB: [birth year] DOA: 03/18/2021  Referring MD/NP/PA: Dr. Charlann Boxer  PCP: Norina Buzzard, MD   Outpatient Specialists: Dr. Candiss Norse  Patient coming from: Home  Chief Complaint: Cough and shortness of breath  HPI: Raymond Lamb is a 35 y.o. male with medical history significant of end-stage renal disease on hemodialysis TTS, coronary artery disease, essential hypertension, type 1 diabetes, hyperlipidemia, peripheral vascular disease status post amputations of the toes, depression with anxiety who is here with his wife.  Patient apparently missed hemodialysis 1 day this week after having some bronchitis type symptoms.  He started having left-sided chest pain today.  It started couple of hours prior to arrival.  EMS arrived and gave him aspirin 325 mg.  Was also given nitro glycerin which did not help patient so they brought him to the ER.  Patient seen in the ER and initial evaluation showed significant hyperkalemia with no significant EKG change.  Patient also noted to have worsening renal function as well as symptoms consistent with possible bronchitis.  He reported some diarrhea associated with this.  However Mrs Dialysis yesterday but this seems to be contributing to his symptoms.  Patient however said he missed a dialysis because of the respiratory symptoms.  So far work-up was uneventful and patient being admitted to the hospital to be dialyzed hopefully tomorrow.  Nephrology aware..  ED Course: Temperature is 98.9 blood pressure 147/90 pulse 110 respirate of 18 oxygen sat 98% on room air.  COVID-19 and influenza negative.  Chest x-ray showed mild pulmonary edema.  Sodium is 133 potassium 6.2 chloride 89.  CO2 15 glucose 288.  BUN 70 creatinine 13.94.  BNP of 39.1.  Patient being admitted to the hospital for further evaluation and treatment and for also hemodialysis in the morning  Review of Systems: As per HPI otherwise 10 point review of  systems negative.    Past Medical History:  Diagnosis Date   Anemia of chronic disease    CAD (coronary artery disease)    a. 11/2015 Cath: LM nl, LAD 30p, LCX nl, OM2 70-80, RCA 50-58m b. 08/2020 MV: EF 56%. No isch/infarct; c. 10/2020 NSTEMI in setting of DKA-->Cath: LM nl, LAD nl, D2 70, LCX nl, OM2 70, OM3 70 inf branch, RCA 80p/m-->Med rx.   ESRD (end stage renal disease) ([place]    Essential hypertension    History of echocardiogram    a. 10/2020 Echo: EF >55%, nl RV fxn.   Mixed hyperlipidemia    PVD [place]peripheral vascular disease) ([place]    a. 07/2015 PTA/Stenting RSFA; b. 12/2015 LLE Fem->PTA bypass; c. 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 04/2020 Duplex: patent LLE bypass. Mild prox RSFA stenosis. Stable ABIs.   Stroke ([facility]    Tobacco abuse    Type 1 diabetes ([place]     Past Surgical History:  Procedure Laterality Date   AVF Left      reports that he has been smoking cigarettes. He has a 6.00 pack-year smoking history. He has never used smokeless tobacco. He reports current drug use. Drug: Marijuana. He reports that he does not drink alcohol.  Allergies  Allergen Reactions   Baclofen Itching and Other (See Comments)    Became disoriented, emesis   Iodinated Diagnostic Agents Hives   Lisinopril Swelling   Other Swelling   Oxybenzone     Other reaction(s): Unknown    Family History  Problem Relation Age of Onset   Diabetes type I  Other    Clotting disorder Neg Hx      Prior to Admission medications   Medication Sig Start Date End Date Taking? Authorizing Provider  amLODipine (NORVASC) 10 MG tablet Take 10 mg by mouth daily. 09/19/20   [provider]  aspirin 81 MG chewable tablet Chew 81 mg by mouth daily. 11/25/20   [provider]  atorvastatin (LIPITOR) 80 MG tablet Take 80 mg by mouth every evening. 09/29/20   [provider]  AURYXIA 1 GM 210 MG(Fe) tablet Take 420 mg by mouth 3 (three) times daily. 10/25/20   [provider]   carvedilol (COREG) 25 MG tablet Take 37.5 mg by mouth in the morning and at bedtime. 11/25/20   [provider]  cinacalcet (SENSIPAR) 60 MG tablet Take 120 mg by mouth daily. 11/08/20   [provider]  clopidogrel (PLAVIX) 75 MG tablet Take 75 mg by mouth daily. 08/30/20   [provider]  ergocalciferol (VITAMIN D2) 1.25 MG (50000 UT) capsule Take 50,000 Units by mouth once a week. 08/11/20   [provider]  gabapentin (NEURONTIN) 300 MG capsule Take 600 mg by mouth at bedtime. 09/28/20   [provider]  hydrALAZINE (APRESOLINE) 50 MG tablet Take 100 mg by mouth every 8 (eight) hours. 08/31/20   [provider]  hydrOXYzine (ATARAX/VISTARIL) 10 MG tablet Take 10 mg by mouth 3 (three) times daily as needed. 08/07/20   [provider]  isosorbide mononitrate (IMDUR) 30 MG 24 hr tablet Take 1 tablet (30 mg total) by mouth 2 (two) times daily. 12/17/20   Jennye Boroughs, MD  LANTUS SOLOSTAR 100 UNIT/ML Solostar Pen Inject 13 Units into the skin at bedtime. 12/17/20   Jennye Boroughs, MD  nitroGLYCERIN (NITROSTAT) 0.4 MG SL tablet Place 1 tablet under the tongue daily as needed. 11/20/20   [provider]  NOVOLOG FLEXPEN 100 UNIT/ML FlexPen Inject 5 Units into the skin 3 (three) times daily. Use per sliding scale no more then 50 units a day 12/02/20   Nolberto Hanlon, MD  sertraline (ZOLOFT) 100 MG tablet Take 150 mg by mouth in the morning. 11/25/20   [provider]    Physical Exam: Vitals:   03/18/21 2027  BP: (!) 147/90  Pulse: (!) 110  Resp: 18  Temp: 98.9 F (37.2 C)  TempSrc: Oral  SpO2: 98%      Constitutional: Acutely ill looking no distress Vitals:   03/18/21 2027  BP: (!) 147/90  Pulse: (!) 110  Resp: 18  Temp: 98.9 F (37.2 C)  TempSrc: Oral  SpO2: 98%   Eyes: PERRL, lids and conjunctivae normal ENMT: Mucous membranes are moist. Posterior pharynx clear of any exudate or lesions.Normal dentition.  Neck:  normal, supple, no masses, no thyromegaly Respiratory: clear to auscultation bilaterally, no wheezing, no crackles. Normal respiratory effort. No accessory muscle use.  Cardiovascular: Sinus tachycardia, no murmurs / rubs / gallops. No extremity edema. 2+ pedal pulses. No carotid bruits.  Abdomen: no tenderness, no masses palpated. No hepatosplenomegaly. Bowel sounds positive.  Musculoskeletal: no clubbing / cyanosis. No joint deformity upper and lower extremities. Good ROM, no contractures. Normal muscle tone.  Skin: no rashes, lesions, ulcers. No induration Neurologic: CN 2-12 grossly intact. Sensation intact, DTR normal. Strength 5/5 in all 4.  Psychiatric: Normal judgment and insight. Alert and oriented x 3. Normal mood.     Labs on Admission: I have personally reviewed following labs and imaging studies  CBC: Recent Labs  Lab 03/18/21 2042  WBC 9.8  NEUTROABS 6.7  HGB 13.7  HCT 41.5  MCV 90.6  PLT 99991111   Basic Metabolic Panel: Recent Labs  Lab 03/18/21 2042  NA 133*  K 6.2*  CL 89*  CO2 15*  GLUCOSE 288*  BUN 70*  CREATININE 13.94*  CALCIUM 8.6*   GFR: CrCl cannot be calculated (Unknown ideal weight.). Liver Function Tests: Recent Labs  Lab 03/18/21 2042  AST 13*  ALT 14  ALKPHOS 181*  BILITOT 1.5*  PROT 8.1  ALBUMIN 3.7   No results for input(s): LIPASE, AMYLASE in the last 168 hours. No results for input(s): AMMONIA in the last 168 hours. Coagulation Profile: No results for input(s): INR, PROTIME in the last 168 hours. Cardiac Enzymes: No results for input(s): CKTOTAL, CKMB, CKMBINDEX, TROPONINI in the last 168 hours. BNP (last 3 results) No results for input(s): PROBNP in the last 8760 hours. HbA1C: No results for input(s): HGBA1C in the last 72 hours. CBG: Recent Labs  Lab 03/18/21 2200  GLUCAP 269*   Lipid Profile: No results for input(s): CHOL, HDL, LDLCALC, TRIG, CHOLHDL, LDLDIRECT in the last 72 hours. Thyroid Function Tests: No results  for input(s): TSH, T4TOTAL, FREET4, T3FREE, THYROIDAB in the last 72 hours. Anemia Panel: No results for input(s): VITAMINB12, FOLATE, FERRITIN, TIBC, IRON, RETICCTPCT in the last 72 hours. Urine analysis: No results found for: COLORURINE, APPEARANCEUR, LABSPEC, PHURINE, GLUCOSEU, HGBUR, BILIRUBINUR, KETONESUR, PROTEINUR, UROBILINOGEN, NITRITE, LEUKOCYTESUR Sepsis Labs: '@LABRCNTIP'$ (procalcitonin:4,lacticidven:4) ) Recent Results (from the past 240 hour(s))  Resp Panel by RT-PCR (Flu A&B, Covid) Nasopharyngeal Swab     Status: None   Collection Time: 03/18/21  8:42 PM   Specimen: Nasopharyngeal Swab; Nasopharyngeal(NP) swabs in vial transport medium  Result Value Ref Range Status   SARS Coronavirus 2 by RT PCR NEGATIVE NEGATIVE Final    Comment: (NOTE) SARS-CoV-2 target nucleic acids are NOT DETECTED.  The SARS-CoV-2 RNA is generally detectable in upper respiratory specimens during the acute phase of infection. The lowest concentration of SARS-CoV-2 viral copies this assay can detect is 138 copies/mL. A negative result does not preclude SARS-Cov-2 infection and should not be used as the sole basis for treatment or other patient management decisions. A negative result may occur with  improper specimen collection/handling, submission of specimen other than nasopharyngeal swab, presence of viral mutation(s) within the areas targeted by this assay, and inadequate number of viral copies(<138 copies/mL). A negative result must be combined with clinical observations, patient history, and epidemiological information. The expected result is Negative.  Fact Sheet for Patients:  EntrepreneurPulse.com.au  Fact Sheet for Healthcare Providers:  IncredibleEmployment.be  This test is no t yet approved or cleared by the [place] FDA and  has been authorized for detection and/or diagnosis of SARS-CoV-2 by FDA under an Emergency Use Authorization (EUA). This  EUA will remain  in effect (meaning this test can be used) for the duration of the COVID-19 declaration under Section 564(b)(1) of the Act, 21 U.S.C.section 360bbb-3(b)(1), unless the authorization is terminated  or revoked sooner.       Influenza A by PCR NEGATIVE NEGATIVE Final   Influenza B by PCR NEGATIVE NEGATIVE Final    Comment: (NOTE) The Xpert Xpress SARS-CoV-2/FLU/RSV plus assay is intended as an aid in the diagnosis of influenza from Nasopharyngeal swab specimens and should not be used as a sole basis for treatment. Nasal washings and aspirates are unacceptable for Xpert Xpress SARS-CoV-2/FLU/RSV testing.  Fact Sheet for Patients: EntrepreneurPulse.com.au  Fact  Sheet for Healthcare Providers: IncredibleEmployment.be  This test is not yet approved or cleared by the [place] and has been authorized for detection and/or diagnosis of SARS-CoV-2 by FDA under an Emergency Use Authorization (EUA). This EUA will remain in effect (meaning this test can be used) for the duration of the COVID-19 declaration under Section 564(b)(1) of the Act, 21 U.S.C. section 360bbb-3(b)(1), unless the authorization is terminated or revoked.  Performed at [facility], [address] [place], [place] [address]      Radiological Exams on Admission: DG Chest 1 View  Result Date: 03/18/2021 CLINICAL DATA:  Chest pain. EXAM: CHEST  1 VIEW COMPARISON:  December 14, 2020. FINDINGS: The heart size and mediastinal contours are within normal limits. Both lungs are clear. The visualized skeletal structures are unremarkable. IMPRESSION: No active disease. Electronically Signed   By: Marijo Conception M.D.   On: 03/18/2021 21:00    EKG: Independently reviewed.  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  Probably related to him missing his dialysis and increase fluid overload.  Keep on telemetry overnight and observe.  Monitor closely.  #2 hyperkalemia: Patient treated initially with insulin and bicarb in the ER.  Nephrology aware.  Recommended initial treatment and will dialyze patient in the morning.  #3 end-stage renal disease: Patient will again be dialyzed tomorrow.  He is asymptomatic now.  #4 type 1 diabetes: Initiate sliding scale insulin and continue with home regimen.  #5 essential hypertension: Blood pressure is controlled at the moment.  Continue monitoring  #6 coronary artery disease: Status post recent cath in May of this year.  It shows 80% occlusion of the RCA.  Continue per cardiology outpatient   DVT prophylaxis: Heparin Code Status: Full code Family Communication: Mother at bedside Disposition Plan: Home Consults called: Dr. Candiss Norse Admission status: Observation  Severity of Illness: The appropriate patient status for this patient is OBSERVATION. Observation status is judged to be reasonable and necessary in order to provide the required intensity of service to ensure the patient's safety. The patient's presenting symptoms, physical exam findings, and initial radiographic and laboratory data in the context of their medical condition is felt to place them at decreased risk for further clinical deterioration. Furthermore, it is anticipated that the patient will be medically stable for discharge from the hospital within 2 midnights of admission. The following factors support the patient status of observation.   " The patient's presenting symptoms include chest pain. " The physical exam findings include mild fluid overload. " The initial radiographic  and laboratory data are potassium 6.2.   Barbette Merino MD [facility] Pager [phone]  If 7PM-7AM, please contact night-coverage [email or web address] Password [facility]  03/18/2021, 11:02 PM

## 2021-03-19 ENCOUNTER — Emergency Department
Admission: EM | Admit: 2021-03-19 | Discharge: 2021-03-19 | Disposition: A | Discharge status: Home / Self Care | Payer: Medicare HMO

## 2021-03-19 ENCOUNTER — Other Ambulatory Visit: Payer: Self-pay

## 2021-03-19 DIAGNOSIS — E875 Hyperkalemia: Secondary | ICD-10-CM | POA: Diagnosis not present

## 2021-03-19 DIAGNOSIS — E1029 Type 1 diabetes mellitus with other diabetic kidney complication: Secondary | ICD-10-CM

## 2021-03-19 DIAGNOSIS — E872 Acidosis: Secondary | ICD-10-CM | POA: Diagnosis not present

## 2021-03-19 DIAGNOSIS — I10 Essential (primary) hypertension: Secondary | ICD-10-CM

## 2021-03-19 DIAGNOSIS — I25118 Atherosclerotic heart disease of native coronary artery with other forms of angina pectoris: Secondary | ICD-10-CM | POA: Diagnosis not present

## 2021-03-19 DIAGNOSIS — N186 End stage renal disease: Secondary | ICD-10-CM | POA: Diagnosis not present

## 2021-03-19 DIAGNOSIS — R072 Precordial pain: Secondary | ICD-10-CM | POA: Diagnosis not present

## 2021-03-19 DIAGNOSIS — E1065 Type 1 diabetes mellitus with hyperglycemia: Secondary | ICD-10-CM

## 2021-03-19 DIAGNOSIS — D631 Anemia in chronic kidney disease: Secondary | ICD-10-CM

## 2021-03-19 NOTE — Progress Notes (Signed)
[facility]  Dialysis Note   Subjective:   Seen and examined on hemodialysis treatment.  Feels much better this morning. Patient had missed 1 treatment prior to admission. Patient was found to have hyperkalemia and had dialysis last night also.   HEMODIALYSIS FLOWSHEET:  Blood Flow Rate (mL/min): 200 mL/min Arterial Pressure (mmHg): -100 mmHg Venous Pressure (mmHg): 90 mmHg Transmembrane Pressure (mmHg): 70 mmHg Ultrafiltration Rate (mL/min): 70 mL/min Dialysate Flow Rate (mL/min): 500 ml/min Conductivity: Machine : 13.7 Conductivity: Machine : 13.7    Objective:  Vital signs in last 24 hours:  Temp:  [98 F (36.7 C)-98.9 F (37.2 C)] 98 F (36.7 C) (09/24 1205) Pulse Rate:  [99-119] 99 (09/24 1205) Resp:  [12-20] 14 (09/24 1205) BP: (115-154)/(75-106) 125/99 (09/24 1205) SpO2:  [91 %-99 %] 98 % (09/24 1205) Weight:  [67.5 kg] 67.5 kg (09/24 0050)  Weight change:  Filed Weights   03/19/21 0050  Weight: 67.5 kg    Intake/Output: I/O last 3 completed shifts: In: 25 [P.O.:8; IV Piggyback:50] Out: -    Intake/Output this shift:  Total I/O In: -  Out: 1502 [Other:1502]  Physical Exam: General: NAD,   Head: Normocephalic, atraumatic. Moist oral mucosal membranes  Eyes: Anicteric, PERRL  Neck: Supple, trachea midline  Lungs:  Clear to auscultation  Heart: Regular rate and rhythm  Abdomen:  Soft, nontender,   Extremities:  peripheral edema.  Neurologic: Nonfocal, moving all four extremities  Skin: No lesions  Access:     Basic Metabolic Panel: Recent Labs  Lab 03/18/21 2042  NA 133*  K 6.2*  CL 89*  CO2 15*  GLUCOSE 288*  BUN 70*  CREATININE 13.94*  CALCIUM 8.6*    Liver Function Tests: Recent Labs  Lab 03/18/21 2042  AST 13*  ALT 14  ALKPHOS 181*  BILITOT 1.5*  PROT 8.1  ALBUMIN 3.7   No results for input(s): LIPASE, AMYLASE in the last 168 hours. No results for input(s): AMMONIA in the last 168 hours.  CBC: Recent  Labs  Lab 03/18/21 2042  WBC 9.8  NEUTROABS 6.7  HGB 13.7  HCT 41.5  MCV 90.6  PLT 189    Cardiac Enzymes: No results for input(s): CKTOTAL, CKMB, CKMBINDEX, TROPONINI in the last 168 hours.  BNP: Invalid input(s): POCBNP  CBG: Recent Labs  Lab 03/18/21 2200  GLUCAP 44*    Microbiology: Results for orders placed or performed during the hospital encounter of 03/18/21  Resp Panel by RT-PCR (Flu A&B, Covid) Nasopharyngeal Swab     Status: None   Collection Time: 03/18/21  8:42 PM   Specimen: Nasopharyngeal Swab; Nasopharyngeal(NP) swabs in vial transport medium  Result Value Ref Range Status   SARS Coronavirus 2 by RT PCR NEGATIVE NEGATIVE Final    Comment: (NOTE) SARS-CoV-2 target nucleic acids are NOT DETECTED.  The SARS-CoV-2 RNA is generally detectable in upper respiratory specimens during the acute phase of infection. The lowest concentration of SARS-CoV-2 viral copies this assay can detect is 138 copies/mL. A negative result does not preclude SARS-Cov-2 infection and should not be used as the sole basis for treatment or other patient management decisions. A negative result may occur with  improper specimen collection/handling, submission of specimen other than nasopharyngeal swab, presence of viral mutation(s) within the areas targeted by this assay, and inadequate number of viral copies(<138 copies/mL). A negative result must be combined with clinical observations, patient history, and epidemiological information. The expected result is Negative.  Fact Sheet for Patients:  EntrepreneurPulse.com.au  Fact Sheet for Healthcare Providers:  IncredibleEmployment.be  This test is no t yet approved or cleared by the [place] FDA and  has been authorized for detection and/or diagnosis of SARS-CoV-2 by FDA under an Emergency Use Authorization (EUA). This EUA will remain  in effect (meaning this test can be used) for the  duration of the COVID-19 declaration under Section 564(b)(1) of the Act, 21 U.S.C.section 360bbb-3(b)(1), unless the authorization is terminated  or revoked sooner.       Influenza A by PCR NEGATIVE NEGATIVE Final   Influenza B by PCR NEGATIVE NEGATIVE Final    Comment: (NOTE) The Xpert Xpress SARS-CoV-2/FLU/RSV plus assay is intended as an aid in the diagnosis of influenza from Nasopharyngeal swab specimens and should not be used as a sole basis for treatment. Nasal washings and aspirates are unacceptable for Xpert Xpress SARS-CoV-2/FLU/RSV testing.  Fact Sheet for Patients: EntrepreneurPulse.com.au  Fact Sheet for Healthcare Providers: IncredibleEmployment.be  This test is not yet approved or cleared by the [place] FDA and has been authorized for detection and/or diagnosis of SARS-CoV-2 by FDA under an Emergency Use Authorization (EUA). This EUA will remain in effect (meaning this test can be used) for the duration of the COVID-19 declaration under Section 564(b)(1) of the Act, 21 U.S.C. section 360bbb-3(b)(1), unless the authorization is terminated or revoked.  Performed at [facility], [address] [place], [place] [address]     Coagulation Studies: No results for input(s): LABPROT, INR in the last 72 hours.  Urinalysis: No results for input(s): COLORURINE, LABSPEC, PHURINE, GLUCOSEU, HGBUR, BILIRUBINUR, KETONESUR, PROTEINUR, UROBILINOGEN, NITRITE, LEUKOCYTESUR in the last 72 hours.  Invalid input(s): APPERANCEUR    Imaging: DG Chest 1 View  Result Date: 03/18/2021 CLINICAL DATA:  Chest pain. EXAM: CHEST  1 VIEW COMPARISON:  December 14, 2020. FINDINGS: The heart size and mediastinal contours are within normal limits. Both lungs are clear. The visualized skeletal structures are unremarkable. IMPRESSION: No active disease. Electronically Signed   By: Marijo Conception M.D.   On: 03/18/2021 21:00     Medications:      Chlorhexidine Gluconate Cloth  6 each Topical Q0600   insulin aspart  0-15 Units Subcutaneous Q4H   sodium bicarbonate  50 mEq Intravenous Once   nitroGLYCERIN  Assessment/ Plan:  [name] is a 35 y.o.  male   Principal Problem:   Hyperkalemia Active Problems:   Anemia in ESRD (end-stage renal disease) ([place])   ESRD (end stage renal disease) ([place])   CAD (coronary artery disease)   Type I diabetes mellitus with renal manifestations, uncontrolled ([place])   HLD (hyperlipidemia)   HTN (hypertension)     End Stage Renal Disease on hemodialysis:   Patient is being dialyzed again today.  And has been tolerating well.  Advised on the importance of treatment compliance.  Intake/Output Summary (Last 24 hours) at 03/19/2021 1243 Last data filed at 03/19/2021 1205 Gross per 24 hour  Intake 58 ml  Output 1502 ml  Net -1444 ml    2. Hypertension with chronic kidney disease: Continue to monitor.   BP (!) 125/99 (BP Location: Right Arm)   Pulse 99   Temp 98 F (36.7 C) (Oral)   Resp 14   Ht '5\' 9"'$  (1.753 m)   Wt 67.5 kg   SpO2 98%   BMI 21.98 kg/m   3. Anemia of chronic kidney disease/ kidney injury/chronic disease/acute blood loss:   Lab Results  Component Value Date  HGB 13.7 03/18/2021   Continue to monitor and follow the protocols. 4. Secondary Hyperparathyroidism:     Lab Results  Component Value Date   CALCIUM 8.6 (L) 03/18/2021   PHOS 3.6 12/16/2020   Continue to monitor.  We will check PTH as outpatient.  Spoke to the patient on the importance of 2 g salt restricted diet and medication compliance. Patient can be discharged home after dialysis if stable. He is advised to follow-up with his outpatient dialysis clinic on Tuesday. Patient understands and is agreeable to follow-up with outpatient dialysis. Case discussed with the hospitalist.   LOS: 0 Lyla Son, MD [facility] 9/24/202212:43 PM

## 2021-03-19 NOTE — ED Notes (Signed)
Report given vanessia, RN from dialysis

## 2021-03-19 NOTE — Progress Notes (Signed)
Pt completed without complication was able to pull 1.5kg. no prolonged bleeding.

## 2021-03-19 NOTE — ED Notes (Signed)
BG 251

## 2021-03-19 NOTE — ED Notes (Signed)
Pt given 8oz of water

## 2021-03-19 NOTE — Progress Notes (Signed)
Tx started, no complications with cannulation. UFR Goal of 1.5KG, pt vitals are not transferring automatically to epic, will monitor closely.

## 2021-03-19 NOTE — ED Notes (Signed)
Pt sleeping NADN 

## 2021-03-19 NOTE — Discharge Summary (Signed)
Physician Discharge Summary  Maleak Lamb [id number] DOB: Dec 30, 1985 DOA: 03/18/2021  PCP: Norina Buzzard, MD  Admit date: 03/18/2021 Discharge date: 03/19/2021  Admitted From: Home  Discharge disposition: Home  Recommendations for Outpatient Follow-Up:   Follow up with your primary care provider as scheduled.  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  Diet recommendation: Low sodium, heart healthy.  Carbohydrate-modified.    Wound care: None.  Code status: Full.   History of Present Illness:   Raymond Lamb is a 35 y.o. male with medical history significant of end-stage renal disease on hemodialysis TTS, coronary artery disease, essential hypertension, type 1 diabetes, hyperlipidemia, peripheral vascular disease status post amputations of the toes, depression with anxiety presented to the hospital after missing dialysis with mild chest discomfort.  Patient was noted to have hyperkalemia but no significant EKG.  Chest x-ray showed pulmonary edema.  Patient was then admitted hospital for further evaluation and treatment.   Hospital Course:   Following conditions were addressed during hospitalization as listed below,  Chest pain: No evidence of ischemia.  Improved.  Troponins flat.    hyperkalemia: Improved after hemodialysis.     End-stage renal disease: Continue with dialysis.    Type 1 diabetes: Resume insulin on discharge   Essential hypertension: Resume home medication.  Patient is on amlodipine Coreg hydralazine Imdur at home.  coronary artery disease: Status post recent cath in May of this year.  It shows 80% occlusion of the RCA.  Continue Coreg  aspirin Lipitor  Disposition.  At this time, patient is stable for disposition home.  Medical Consultants:   Nephrology  Procedures:    Hemodialysis Subjective:   Today, patient was seen and examined at bedside.  Seen during hemodialysis.  Patient denies any chest pain, shortness of breath at this time. Discharge Exam:   Vitals:   03/19/21 1200 03/19/21 1205  BP: (!) 150/105 (!) 125/99  Pulse: (!) 104 99  Resp: 14 14  Temp:  98 F (36.7 C)  SpO2: 97% 98%   Vitals:   03/19/21 1130 03/19/21 1145 03/19/21 1200 03/19/21 1205  BP: 115/79 (!) 139/106 (!) 150/105 (!) 125/99  Pulse: (!) 119 (!) 103 (!) 104 99  Resp: '15 14 14 14  '$ Temp:    98 F (36.7 C)  TempSrc:    Oral  SpO2: 99% 99% 97% 98%  Weight:      Height:        General: Alert awake, not in obvious distress HENT: pupils equally reacting to light,  No scleral pallor or icterus noted. Oral mucosa is moist.  Chest:  Clear breath sounds.  Diminished breath sounds bilaterally. No crackles or wheezes.  CVS: S1 &S2 heard. No murmur.  Regular rate and rhythm.  Sinus tachycardia. Abdomen: Soft, nontender, nondistended.  Bowel sounds are heard.   Extremities: No cyanosis, clubbing or edema.  Peripheral pulses are palpable. Psych: Alert, awake and oriented, normal mood CNS:  No cranial nerve deficits.  Power equal in all extremities.   Skin: Warm and dry.  No rashes noted.  The results of significant diagnostics from this hospitalization (including imaging, microbiology, ancillary and laboratory) are listed below for reference.  Diagnostic Studies:   DG Chest 1 View  Result Date: 03/18/2021 CLINICAL DATA:  Chest pain. EXAM: CHEST  1 VIEW COMPARISON:  December 14, 2020. FINDINGS: The heart size and mediastinal contours are within normal limits. Both lungs are clear. The visualized skeletal structures are unremarkable. IMPRESSION: No active disease. Electronically Signed   By: Marijo Conception M.D.   On: 03/18/2021 21:00      Labs:   Basic Metabolic Panel: Recent Labs  Lab 03/18/21 2042  NA 133*  K 6.2*  CL 89*  CO2 15*  GLUCOSE 288*  BUN 70*  CREATININE 13.94*  CALCIUM 8.6*   GFR Estimated Creatinine Clearance: 6.9 mL/min (A) (by C-G formula based on SCr of 13.94 mg/dL (H)). Liver Function Tests: Recent Labs  Lab 03/18/21 2042  AST 13*  ALT 14  ALKPHOS 181*  BILITOT 1.5*  PROT 8.1  ALBUMIN 3.7   No results for input(s): LIPASE, AMYLASE in the last 168 hours. No results for input(s): AMMONIA in the last 168 hours. Coagulation profile No results for input(s): INR, PROTIME in the last 168 hours.  CBC: Recent Labs  Lab 03/18/21 2042  WBC 9.8  NEUTROABS 6.7  HGB 13.7  HCT 41.5  MCV 90.6  PLT 189   Cardiac Enzymes: No results for input(s): CKTOTAL, CKMB, CKMBINDEX, TROPONINI in the last 168 hours. BNP: Invalid input(s): POCBNP CBG: Recent Labs  Lab 03/18/21 2200  GLUCAP 269*   D-Dimer No results for input(s): DDIMER in the last 72 hours. Hgb A1c No results for input(s): HGBA1C in the last 72 hours. Lipid Profile No results for input(s): CHOL, HDL, LDLCALC, TRIG, CHOLHDL, LDLDIRECT in the last 72 hours. Thyroid function studies No results for input(s): TSH, T4TOTAL, T3FREE, THYROIDAB in the last 72 hours.  Invalid input(s): FREET3 Anemia work up No results for input(s): VITAMINB12, FOLATE, FERRITIN, TIBC, IRON, RETICCTPCT in the last 72 hours. Microbiology Recent Results (from the past 240 hour(s))  Resp Panel by RT-PCR (Flu A&B, Covid) Nasopharyngeal Swab     Status: None   Collection Time: 03/18/21  8:42 PM   Specimen: Nasopharyngeal Swab; Nasopharyngeal(NP) swabs in vial transport medium  Result Value Ref Range Status   SARS Coronavirus 2 by RT PCR NEGATIVE NEGATIVE Final    Comment: (NOTE) SARS-CoV-2 target nucleic acids are NOT DETECTED.  The SARS-CoV-2 RNA is generally detectable in upper respiratory specimens during the acute phase of infection. The  lowest concentration of SARS-CoV-2 viral copies this assay can detect is 138 copies/mL. A negative result does not preclude SARS-Cov-2 infection and should not be used as the sole basis for treatment or other patient management decisions. A negative result may occur with  improper specimen collection/handling, submission of specimen other than nasopharyngeal swab, presence of viral mutation(s) within the areas targeted by this assay, and inadequate number of viral copies(<138 copies/mL). A negative result must be combined with clinical observations, patient history, and epidemiological information. The expected result is Negative.  Fact Sheet for Patients:  EntrepreneurPulse.com.au  Fact Sheet for Healthcare Providers:  IncredibleEmployment.be  This test is no t yet approved or cleared by the [place] FDA and  has been authorized for detection and/or diagnosis of SARS-CoV-2 by FDA under an Emergency Use Authorization (EUA). This EUA will remain  in effect (meaning this test can be used) for the duration of the COVID-19 declaration under Section 564(b)(1) of the Act, 21 U.S.C.section 360bbb-3(b)(1), unless the authorization is terminated  or revoked sooner.  Influenza A by PCR NEGATIVE NEGATIVE Final   Influenza B by PCR NEGATIVE NEGATIVE Final    Comment: (NOTE) The Xpert Xpress SARS-CoV-2/FLU/RSV plus assay is intended as an aid in the diagnosis of influenza from Nasopharyngeal swab specimens and should not be used as a sole basis for treatment. Nasal washings and aspirates are unacceptable for Xpert Xpress SARS-CoV-2/FLU/RSV testing.  Fact Sheet for Patients: EntrepreneurPulse.com.au  Fact Sheet for Healthcare Providers: IncredibleEmployment.be  This test is not yet approved or cleared by the [place] FDA and has been authorized for detection and/or diagnosis of SARS-CoV-2 by FDA under  an Emergency Use Authorization (EUA). This EUA will remain in effect (meaning this test can be used) for the duration of the COVID-19 declaration under Section 564(b)(1) of the Act, 21 U.S.C. section 360bbb-3(b)(1), unless the authorization is terminated or revoked.  Performed at [facility], [address] [place], [place] [address]      Discharge Instructions:   Discharge Instructions     Diet - low sodium heart healthy   Complete by: As directed    Discharge instructions   Complete by: As directed    Continue hemodialysis without interruption. Follow up with your primary care provider as a scheduled by you.   Increase activity slowly   Complete by: As directed       Allergies as of 03/19/2021       Reactions   Baclofen Itching, Other (See Comments)   Became disoriented, emesis   Iodinated Diagnostic Agents Hives   Lisinopril Swelling   Other Swelling   Oxybenzone    Other reaction(s): Unknown        Medication List     TAKE these medications    amLODipine 10 MG tablet Commonly known as: NORVASC Take 10 mg by mouth daily.   aspirin 81 MG chewable tablet Chew 81 mg by mouth daily.   atorvastatin 80 MG tablet Commonly known as: LIPITOR Take 80 mg by mouth every evening.   Auryxia 1 GM 210 MG(Fe) tablet Generic drug: ferric citrate Take 420 mg by mouth 3 (three) times daily.   carvedilol 25 MG tablet Commonly known as: COREG Take 37.5 mg by mouth in the morning and at bedtime.   cinacalcet 60 MG tablet Commonly known as: SENSIPAR Take 120 mg by mouth daily.   clopidogrel 75 MG tablet Commonly known as: PLAVIX Take 75 mg by mouth daily.   ergocalciferol 1.25 MG (50000 UT) capsule Commonly known as: VITAMIN D2 Take 50,000 Units by mouth once a week.   gabapentin 300 MG capsule Commonly known as: NEURONTIN Take 600 mg by mouth at bedtime.   hydrALAZINE 50 MG tablet Commonly known as: APRESOLINE Take 100 mg by mouth every 8  (eight) hours.   hydrOXYzine 10 MG tablet Commonly known as: ATARAX/VISTARIL Take 10 mg by mouth 3 (three) times daily as needed.   isosorbide mononitrate 30 MG 24 hr tablet Commonly known as: IMDUR Take 1 tablet (30 mg total) by mouth 2 (two) times daily.   Lantus SoloStar 100 UNIT/ML Solostar Pen Generic drug: insulin glargine Inject 13 Units into the skin at bedtime.   nitroGLYCERIN 0.4 MG SL tablet Commonly known as: NITROSTAT Place 1 tablet under the tongue daily as needed.   NovoLOG FlexPen 100 UNIT/ML FlexPen Generic drug: insulin aspart Inject 5 Units into the skin 3 (three) times daily. Use per sliding scale no more then 50 units a day   sertraline 100 MG tablet Commonly known as:  ZOLOFT Take 150 mg by mouth in the morning.        Follow-up Information     Norina Buzzard, MD Follow up in 1 week(s).   Specialty: Family Medicine Contact information: [place] [address] [phone]                 Time coordinating discharge: 39 minutes  Signed:  Adyn Hoes  [facility] 03/19/2021, 12:46 PM

## 2021-03-21 LAB — HEMOGLOBIN A1C
Hgb A1c MFr Bld: 10.1 % — ABNORMAL HIGH (ref 4.8–5.6)
Mean Plasma Glucose: 243 mg/dL

## 2021-10-08 ENCOUNTER — Other Ambulatory Visit: Payer: Self-pay

## 2021-10-08 ENCOUNTER — Inpatient Hospital Stay
Admission: EM | Admit: 2021-10-08 | Discharge: 2021-10-11 | DRG: 637 | Disposition: A | Discharge status: Home / Self Care | Payer: Medicare HMO | Attending: Internal Medicine | Admitting: Internal Medicine

## 2021-10-08 ENCOUNTER — Emergency Department: Payer: Medicare HMO

## 2021-10-08 DIAGNOSIS — I4891 Unspecified atrial fibrillation: Secondary | ICD-10-CM | POA: Diagnosis present

## 2021-10-08 DIAGNOSIS — Z833 Family history of diabetes mellitus: Secondary | ICD-10-CM

## 2021-10-08 DIAGNOSIS — R680 Hypothermia, not associated with low environmental temperature: Secondary | ICD-10-CM | POA: Diagnosis present

## 2021-10-08 DIAGNOSIS — I252 Old myocardial infarction: Secondary | ICD-10-CM | POA: Diagnosis not present

## 2021-10-08 DIAGNOSIS — E782 Mixed hyperlipidemia: Secondary | ICD-10-CM | POA: Diagnosis present

## 2021-10-08 DIAGNOSIS — R451 Restlessness and agitation: Secondary | ICD-10-CM | POA: Diagnosis present

## 2021-10-08 DIAGNOSIS — R6510 Systemic inflammatory response syndrome (SIRS) of non-infectious origin without acute organ dysfunction: Secondary | ICD-10-CM | POA: Diagnosis present

## 2021-10-08 DIAGNOSIS — E10649 Type 1 diabetes mellitus with hypoglycemia without coma: Secondary | ICD-10-CM

## 2021-10-08 DIAGNOSIS — R571 Hypovolemic shock: Secondary | ICD-10-CM | POA: Diagnosis present

## 2021-10-08 DIAGNOSIS — E1022 Type 1 diabetes mellitus with diabetic chronic kidney disease: Secondary | ICD-10-CM | POA: Diagnosis present

## 2021-10-08 DIAGNOSIS — N186 End stage renal disease: Secondary | ICD-10-CM | POA: Diagnosis present

## 2021-10-08 DIAGNOSIS — Z86711 Personal history of pulmonary embolism: Secondary | ICD-10-CM

## 2021-10-08 DIAGNOSIS — Z992 Dependence on renal dialysis: Secondary | ICD-10-CM

## 2021-10-08 DIAGNOSIS — R4182 Altered mental status, unspecified: Secondary | ICD-10-CM

## 2021-10-08 DIAGNOSIS — X58XXXA Exposure to other specified factors, initial encounter: Secondary | ICD-10-CM | POA: Diagnosis present

## 2021-10-08 DIAGNOSIS — E1051 Type 1 diabetes mellitus with diabetic peripheral angiopathy without gangrene: Secondary | ICD-10-CM | POA: Diagnosis present

## 2021-10-08 DIAGNOSIS — T4275XA Adverse effect of unspecified antiepileptic and sedative-hypnotic drugs, initial encounter: Secondary | ICD-10-CM | POA: Diagnosis present

## 2021-10-08 DIAGNOSIS — G928 Other toxic encephalopathy: Secondary | ICD-10-CM | POA: Diagnosis present

## 2021-10-08 DIAGNOSIS — E162 Hypoglycemia, unspecified: Principal | ICD-10-CM | POA: Diagnosis present

## 2021-10-08 DIAGNOSIS — D631 Anemia in chronic kidney disease: Secondary | ICD-10-CM | POA: Diagnosis present

## 2021-10-08 DIAGNOSIS — J320 Chronic maxillary sinusitis: Secondary | ICD-10-CM | POA: Diagnosis present

## 2021-10-08 DIAGNOSIS — S51812A Laceration without foreign body of left forearm, initial encounter: Secondary | ICD-10-CM | POA: Diagnosis present

## 2021-10-08 DIAGNOSIS — I2510 Atherosclerotic heart disease of native coronary artery without angina pectoris: Secondary | ICD-10-CM | POA: Diagnosis present

## 2021-10-08 DIAGNOSIS — F17210 Nicotine dependence, cigarettes, uncomplicated: Secondary | ICD-10-CM | POA: Diagnosis present

## 2021-10-08 DIAGNOSIS — I69354 Hemiplegia and hemiparesis following cerebral infarction affecting left non-dominant side: Secondary | ICD-10-CM

## 2021-10-08 DIAGNOSIS — I70203 Unspecified atherosclerosis of native arteries of extremities, bilateral legs: Secondary | ICD-10-CM | POA: Diagnosis present

## 2021-10-08 DIAGNOSIS — G9341 Metabolic encephalopathy: Secondary | ICD-10-CM | POA: Diagnosis present

## 2021-10-08 DIAGNOSIS — E10641 Type 1 diabetes mellitus with hypoglycemia with coma: Principal | ICD-10-CM | POA: Diagnosis present

## 2021-10-08 DIAGNOSIS — Z95820 Peripheral vascular angioplasty status with implants and grafts: Secondary | ICD-10-CM | POA: Diagnosis not present

## 2021-10-08 DIAGNOSIS — T68XXXA Hypothermia, initial encounter: Secondary | ICD-10-CM

## 2021-10-08 DIAGNOSIS — I959 Hypotension, unspecified: Secondary | ICD-10-CM

## 2021-10-08 DIAGNOSIS — Z993 Dependence on wheelchair: Secondary | ICD-10-CM

## 2021-10-08 DIAGNOSIS — Z86718 Personal history of other venous thrombosis and embolism: Secondary | ICD-10-CM

## 2021-10-08 DIAGNOSIS — Z794 Long term (current) use of insulin: Secondary | ICD-10-CM

## 2021-10-08 DIAGNOSIS — I120 Hypertensive chronic kidney disease with stage 5 chronic kidney disease or end stage renal disease: Secondary | ICD-10-CM | POA: Diagnosis present

## 2021-10-08 LAB — BLOOD GAS, VENOUS
Acid-Base Excess: 4.7 mmol/L — ABNORMAL HIGH (ref 0.0–2.0)
Bicarbonate: 32 mmol/L — ABNORMAL HIGH (ref 20.0–28.0)
O2 Saturation: 72.1 %
Patient temperature: 37
pCO2, Ven: 58 mmHg (ref 44–60)
pH, Ven: 7.35 (ref 7.25–7.43)
pO2, Ven: 42 mmHg (ref 32–45)

## 2021-10-08 LAB — COMPREHENSIVE METABOLIC PANEL
ALT: 28 U/L (ref 0–44)
AST: 26 U/L (ref 15–41)
Albumin: 4.2 g/dL (ref 3.5–5.0)
Alkaline Phosphatase: 70 U/L (ref 38–126)
Anion gap: 14 (ref 5–15)
BUN: 33 mg/dL — ABNORMAL HIGH (ref 6–20)
CO2: 26 mmol/L (ref 22–32)
Calcium: 9.1 mg/dL (ref 8.9–10.3)
Chloride: 99 mmol/L (ref 98–111)
Creatinine, Ser: 6.3 mg/dL — ABNORMAL HIGH (ref 0.61–1.24)
GFR, Estimated: 11 mL/min — ABNORMAL LOW (ref >60)
Glucose, Bld: 45 mg/dL — ABNORMAL LOW (ref 70–99)
Potassium: 3.8 mmol/L (ref 3.5–5.1)
Sodium: 139 mmol/L (ref 135–145)
Total Bilirubin: 1.1 mg/dL (ref 0.3–1.2)
Total Protein: 8.8 g/dL — ABNORMAL HIGH (ref 6.5–8.1)

## 2021-10-08 LAB — AMMONIA: Ammonia: 37 umol/L — ABNORMAL HIGH (ref 9–35)

## 2021-10-08 LAB — APTT: aPTT: 35 seconds (ref 24–36)

## 2021-10-08 LAB — CBC WITH DIFFERENTIAL/PLATELET
Abs Immature Granulocytes: 0.04 10*3/uL (ref 0.00–0.07)
Basophils Absolute: 0.1 10*3/uL (ref 0.0–0.1)
Basophils Relative: 1 %
Eosinophils Absolute: 0.4 10*3/uL (ref 0.0–0.5)
Eosinophils Relative: 5 %
HCT: 48.3 % (ref 39.0–52.0)
Hemoglobin: 15 g/dL (ref 13.0–17.0)
Immature Granulocytes: 0 %
Lymphocytes Relative: 26 %
Lymphs Abs: 2.4 10*3/uL (ref 0.7–4.0)
MCH: 29.6 pg (ref 26.0–34.0)
MCHC: 31.1 g/dL (ref 30.0–36.0)
MCV: 95.3 fL (ref 80.0–100.0)
Monocytes Absolute: 0.9 10*3/uL (ref 0.1–1.0)
Monocytes Relative: 10 %
Neutro Abs: 5.4 10*3/uL (ref 1.7–7.7)
Neutrophils Relative %: 58 %
Platelets: 223 10*3/uL (ref 150–400)
RBC: 5.07 MIL/uL (ref 4.22–5.81)
RDW: 17.9 % — ABNORMAL HIGH (ref 11.5–15.5)
WBC: 9.3 10*3/uL (ref 4.0–10.5)
nRBC: 0 % (ref 0.0–0.2)

## 2021-10-08 LAB — ETHANOL: Alcohol, Ethyl (B): <10 mg/dL (ref <10)

## 2021-10-08 LAB — CBG MONITORING, ED
Glucose-Capillary: 22 mg/dL — CL (ref 70–99)
Glucose-Capillary: 106 mg/dL — ABNORMAL HIGH (ref 70–99)
Glucose-Capillary: 65 mg/dL — ABNORMAL LOW (ref 70–99)
Glucose-Capillary: 95 mg/dL (ref 70–99)
Glucose-Capillary: 100 mg/dL — ABNORMAL HIGH (ref 70–99)
Glucose-Capillary: 37 mg/dL — CL (ref 70–99)
Glucose-Capillary: 39 mg/dL — CL (ref 70–99)

## 2021-10-08 LAB — TSH: TSH: 2.503 u[IU]/mL (ref 0.350–4.500)

## 2021-10-08 LAB — PROTIME-INR
INR: 1.1 (ref 0.8–1.2)
Prothrombin Time: 13.6 seconds (ref 11.4–15.2)

## 2021-10-08 LAB — LACTIC ACID, PLASMA: Lactic Acid, Venous: 1.2 mmol/L (ref 0.5–1.9)

## 2021-10-08 MED ORDER — LORAZEPAM 2 MG/ML IJ SOLN
INTRAMUSCULAR | Status: AC
  Administered 2021-10-08: 2 mg via INTRAVENOUS
  Filled 2021-10-08: qty 1

## 2021-10-08 MED ORDER — LACTATED RINGERS IV BOLUS
1000.0000 mL | Freq: Once | INTRAVENOUS | Status: AC
  Administered 2021-10-08: 1000 mL via INTRAVENOUS

## 2021-10-08 MED ORDER — DEXTROSE 50 % IV SOLN: 1.0000 | Freq: Once | INTRAVENOUS | Status: DC

## 2021-10-08 MED ORDER — DEXTROSE 10 % IV SOLN: INTRAVENOUS | Status: DC

## 2021-10-08 MED ORDER — PANTOPRAZOLE SODIUM 40 MG IV SOLR
40.0000 mg | Freq: Every day | INTRAVENOUS | Status: DC
  Administered 2021-10-09: 40 mg via INTRAVENOUS

## 2021-10-08 MED ORDER — HALOPERIDOL LACTATE 5 MG/ML IJ SOLN
INTRAMUSCULAR | Status: AC
  Administered 2021-10-08: 5 mg
  Filled 2021-10-08: qty 1

## 2021-10-08 MED ORDER — VANCOMYCIN HCL 1500 MG/300ML IV SOLN
1500.0000 mg | Freq: Once | INTRAVENOUS | Status: AC
Start: 2021-10-08 — End: 2021-10-09
  Administered 2021-10-08: 1500 mg via INTRAVENOUS
  Filled 2021-10-08: qty 300

## 2021-10-08 MED ORDER — POLYETHYLENE GLYCOL 3350 17 G PO PACK: 17.0000 g | PACK | Freq: Every day | ORAL | Status: DC | PRN

## 2021-10-08 MED ORDER — DOCUSATE SODIUM 100 MG PO CAPS: 100.0000 mg | ORAL_CAPSULE | Freq: Two times a day (BID) | ORAL | Status: DC | PRN

## 2021-10-08 MED ORDER — DEXTROSE 50 % IV SOLN
50.0000 mL | Freq: Once | INTRAVENOUS | Status: AC
  Administered 2021-10-08: 50 mL via INTRAVENOUS
  Filled 2021-10-08: qty 50

## 2021-10-08 MED ORDER — HEPARIN SODIUM (PORCINE) 5000 UNIT/ML IJ SOLN: 5000.0000 [IU] | Freq: Three times a day (TID) | INTRAMUSCULAR | Status: DC

## 2021-10-08 MED ORDER — LORAZEPAM 2 MG/ML IJ SOLN
2.0000 mg | Freq: Once | INTRAMUSCULAR | Status: AC
Start: 2021-10-08 — End: 2021-10-08

## 2021-10-08 MED ORDER — HYDROCORTISONE SOD SUC (PF) 100 MG IJ SOLR
100.0000 mg | INTRAMUSCULAR | Status: AC
  Administered 2021-10-08: 100 mg via INTRAVENOUS
  Filled 2021-10-08: qty 2

## 2021-10-08 MED ORDER — SODIUM CHLORIDE 0.9 % IV SOLN
1.0000 g | Freq: Once | INTRAVENOUS | Status: AC
  Administered 2021-10-09: 1 g via INTRAVENOUS
  Filled 2021-10-08: qty 1

## 2021-10-08 MED ORDER — CHLORHEXIDINE GLUCONATE CLOTH 2 % EX PADS
6.0000 | MEDICATED_PAD | Freq: Every day | CUTANEOUS | Status: DC
  Administered 2021-10-09 – 2021-10-11 (×2): 6 via TOPICAL
  Filled 2021-10-08: qty 6

## 2021-10-08 MED ORDER — NOREPINEPHRINE 4 MG/250ML-% IV SOLN
2.0000 ug/min | INTRAVENOUS | Status: DC
  Administered 2021-10-08: 2 ug/min via INTRAVENOUS
  Filled 2021-10-08: qty 250

## 2021-10-08 MED ORDER — SODIUM CHLORIDE 0.9 % IV SOLN: 250.0000 mL | INTRAVENOUS | Status: DC

## 2021-10-08 NOTE — ED Notes (Signed)
Pt states he did take his insulin this evening. Pt now responds to verbal stimuli & answers questions appropriately.  ?

## 2021-10-08 NOTE — H&P (Addendum)
? ?NAME:  Raymond Lamb, MRN:  [id number], DOB:  11-09-1985, LOS: 0 ?ADMISSION DATE:  10/08/2021, CONSULTATION DATE:  10/09/21 ?REFERRING MD:  Dr. Creig Hines, CHIEF COMPLAINT:  Unresponsive & hypoglycemia  ? ?History of Present Illness:  ?36 yo M presenting to [facility] ED on 10/08/21 from home via EMS after being found unresponsive at the home he shares with his mother by his cousin. History provided by his mother, Derek Jack, via telephone. She reports the patient was in his normal state of health. He got back from an ?iHD session, tired which is usual for him and was getting ready to eat when she left. She reports that often after HD patient is lethargic and sleeps so soundly he does not check his sugar. Mom concerned that maybe he didn't eat enough. She confirmed that he uses SQ insulin, but wears a monitor and provided his CBG readings from the afternoon of 4/15: 15:30- 278, 16:13-384, 17:30-93, 18:40-101, 19:30-45, 19:55-low. ?Her nephew went to check on the patient but couldn't get a response at the front door and climbed in through the window, finding him unresponsive, calling EMS. Per ED documentation, EMS gave the patient 2 D 10 boluses which improved his blood glucose to 100 but the patient was still minimally responsive.  ?ED course: ?Per ED documentation, on arrival the patient was only able to verbally respond with his name. Patient remained severely hypoglycemic at 22 and received an amp of D 50. After this administration he became more responsive able to answer some questions, but subsequently more confused. He was given ativan & haldol in order to safely place an IV & obtain CT imaging. Blood glucose had stabilized, then dropped again from 106 to the 30's and the patient was placed on a D 10 infusion. Patient also hypotensive at 82/54 and hypothermic at 92.2, receiving 1 L of LR and bear hugger placement.  ?Medications given: D50, solu cortef, 1 L LR, ativan 2 mg, haldol 5 mg, cefepime & vancomycin, D10  infusion & levophed drip initiated. ?Initial Vitals: hypothermic 94, RR 17, NSR 63, BP 92/67 & spo2 100 % on 4 L Laura ?Significant labs: (Labs/ Imaging personally reviewed) ?I, Domingo Pulse Rust-Chester, AGACNP-BC, personally viewed and interpreted this ECG. ?EKG Interpretation: Date: 10/08/21, EKG Time: 20:57, Rate: 69, Rhythm: NSR, QRS Axis: borderline LAD, Intervals: LVH, ST/T Wave abnormalities: mild anterior STE (does not meet STEMI criteria) in the setting of LVH , Narrative Interpretation: NSR with mild anterior STE in the setting of LVH (does not meet STEMI crtieria) ?Chemistry: Na+: 139, K+: 3.8, BUN/Cr.: 33/6.30, Serum CO2/ AG: 26/14, Ammonia: 37 ?Hematology: WBC: 9.3, Hgb: 15.0,  ?Lactic: 1.2 ?ABG: 7.35/ 58/ 42/ 32 ?TSH: 2.503 ?Cortisol: pending ?CXR 10/08/21: first CXR showed hazy opacity over RUL/RML, however repeat demonstrates improved 'very mild' stable hazy increased lung markings overlying the mid to upper right lung ?CT head wo contrast 10/08/21: no acute intracranial abnormality.  ?CT cervical spine wo contrast 10/08/21: No acute displaced fracture or traumatic listhesis of the cervical spine. Non specifc right occipital scalp calcification, left maxillary sinus disease. ? ?PCCM consulted for admission due to altered mental status, high risk for intubation, and hypotension requiring vasopressor support. ? 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 ?Interim History / Subjective:  ?Patient somnolent after Ativan & Haldol administration. MAE to physical stimuli, protecting airway at this time, SpO2 > 95% on RA.   ?CBG 65 on arrival to ICU with D10 infusing at 75 mL. Additional 1/2 amp of D 50 ordered ?HIGH RISK FOR INTUBATION ? ?Objective   ?Blood pressure (!) 80/57, pulse 60, temperature (!) 92.2 ?F (33.4 ?C), resp. rate 14, height 5\' 9"  (1.753 m), weight 69.5 kg, SpO2 95 %. ?   ?   ?No intake or output data in the 24 hours ending 10/08/21 2308 ?Filed Weights  ? 10/08/21 2100  ?Weight: 69.5 kg  ? ? ?Examination: ?General: Adult male, critically ill, lying in bed, NAD ?HEENT: MM pink/moist, anicteric, atraumatic, neck supple ?Neuro: RASS -4, unable to commands, PERRL +4, MAE ?CV: s1s2 RRR, NSR on monitor, no r/m/g ?Pulm: Regular, non labored on RA, breath sounds clear-BUL & diminished-BLL ?GI: soft, rounded, non tender, bs x 4 ?Skin: laceration on L forearm ?Extremities: warm/dry, pulses + 2 R/P, no edema noted, L AV fistula +bruit/+thrill ? ?Resolved Hospital Problem list   ? ? ?Assessment & Plan:  ?Shock secondary to unknown etiology in the setting of sedating medications vs hypovolemia from iHD vs suspected sepsis? ?PMHx: HTN ?Low suspicion for sepsis or occult infection, Lactic: 1.2, no leukocytosis. CXR demonstrates very mild hazy opacity in RUL/RML. Patient received 2 mg of Ativan & 5 mg Haldol because of agitation. Pt received 1 L of LR & cefepime/vancomycin ?- continue peripheral levophed, wean a tolerated to maintain MAP > 65 ?- conservative IVF in the setting of ESRD ?- trend lactic, f/u cultures, PCT not useful d/t ESRD ?- daily CBC, trend WBC/fever curve > will hold on additional ABX at this time, start as needed ?- hold outpatient antihypertensives, restart as patient stabilizes ? ?Severe Hypoglycemia  ?PMHx: T1DM ?Mom suspects the patient did not eat enough with insulin. Patient received multiple doses of Dextrose. On arrival to ICU CBG 65 on D10 @ 75 mL/h, additional 1/2 amp D50 ordered. ?Hemoglobin A1C: pending ?- Monitor CBG Q 1 hours > once 4 stable CBG, transition to Q 2 x 2 stable CBG, transition to Q 4  h ?- continue D10 infusion, adjust rate as needed ?- hold SSI & Lantus dosing at this time ?- target range while in ICU: 140-180 ?- follow ICU hyper/hypo-glycemia protocol ?- consult Diabetes Coordinator ? ?Acute Toxic Metabolic Encephalopathy in the setting of severe hypoglycemia and sedating medications ?PMHx: CVA (with residual LEFT hemiparesis) ?Patient became agitated after arrival to ED, receiving Ativan & haldol ?- avoid sedating medications  ?- if pt remains altered consider MRI ?- neuro checks Q 4 until stabilized ?- supportive care ?- HIGH risk for intubation ? ?Pulmonary Embolism- history of on Eliquis ?PMHx: PVD, CVA, HTN, HLD, CAD ?- hold outpatient Eliquis due to lethargy ?- initiate heparin drip per pharmacy protocol ?- restart outpatient Lipitor once able to take PO medications safely ?- continuous cardiac monitoring ? ?ESRD (TThSa) ?Last iHD 10/08/21 ?- consult nephrology for iHD management ?- Strict I/O's: alert provider if UOP < 0.5 mL/kg/hr ?- Daily BMP, replace electrolytes PRN ?- Avoid nephrotoxic agents as able, ensure adequate renal perfusion ? ?Mild Hyperammoniemia - 37 ?Other LFT's WNL. Pt does not drink ETOH regularly per mom ?- trend hepatic function ?- f/u ammonia in AM ? ?Best Practice (right click and "Reselect all SmartList Selections" daily)  ?Diet/type:  NPO ?DVT prophylaxis: systemic heparin ?GI prophylaxis: PPI ?Lines: N/A ?Foley:  N/A ?Code Status:  full code ?Last date of multidisciplinary goals of care discussion [10/08/21] ? ?Labs   ?CBC: ?Recent Labs  ?Lab 10/08/21 ?2056  ?WBC 9.3  ?NEUTROABS 5.4  ?HGB 15.0  ?HCT 48.3  ?MCV 95.3  ?PLT 223  ? ? ?Basic Metabolic Panel: ?Recent Labs  ?Lab 10/08/21 ?2056  ?NA 139  ?K 3.8  ?CL 99  ?CO2 26  ?GLUCOSE 45*  ?BUN 33*  ?CREATININE 6.30*  ?CALCIUM 9.1  ? ?GFR: ?Estimated Creatinine Clearance: 15.8 mL/min (A) (by C-G formula based on SCr of 6.3 mg/dL (H)). ?Recent Labs  ?Lab 10/08/21 ?2056 10/08/21 ?2106  ?WBC 9.3  --   ?LATICACIDVEN  --   1.2  ? ? ?Liver Function Tests: ?Recent Labs  ?Lab 10/08/21 ?2056  ?AST 26  ?ALT 28  ?ALKPHOS 70  ?BILITOT 1.1  ?PROT 8.8*  ?ALBUMIN 4.2  ? ?No results for input(s): LIPASE, AMYLASE in the last 168 hours. ?

## 2021-10-08 NOTE — Consult Note (Signed)
PHARMACY -  BRIEF ANTIBIOTIC NOTE  ? ?Pharmacy has received consult(s) for Vancomycin and Cefepime from an ED provider.  The patient's profile has been reviewed for ht/wt/allergies/indication/available labs.   ? ?One time order(s) placed for  ?Vancomycin 1500mg  x 1 dose ?Cefepime 1gm x 1 dose ? ?Further antibiotics/pharmacy consults should be ordered by admitting physician if indicated.       ?                ?Thank you, ?Shantoria Ellwood Rodriguez-Guzman PharmD, BCPS ?10/08/2021 10:45 PM ? ?

## 2021-10-08 NOTE — ED Notes (Signed)
Patient transported to CT 

## 2021-10-08 NOTE — ED Provider Notes (Signed)
? ?[facility] ?Provider Note ? ? ? Event Date/Time  ? First MD Initiated Contact with Patient 10/08/21 2054   ?  (approximate) ? ? ?History  ? ?Hypoglycemia ? ? ?HPI ? ?Raymond Lamb is a 36 y.o. male with a past medical history of HTN, HDL, CAD, type 1 diabetes, anemia and ESRD as well as a CVA with residual left hemibody weakness who presents EMS from home after was found unresponsive by family.  Per EMS he was hypoglycemic and received 2 D10 boluses with improvement to his sugar to 100 but still was minimally responsive.  No other history is available on arrival from patient as he is minimally verbal only able to state his name.  Shortly after patient arrived family arrived as well states that he was found in his bed unresponsive and that they are concerned he took an excessive amount of insulin or did not eat.  They state he did get dialysis today.  They are not aware of any recent other sick symptoms such as fevers, vomiting, diarrhea or falls or injuries.  They are not suspicious for toxic ingestion. ?  ? ? ?Physical Exam  ?Triage Vital Signs: ?ED Triage Vitals  ?Enc Vitals Group  ?   BP   ?   Pulse   ?   Resp   ?   Temp   ?   Temp src   ?   SpO2   ?   Weight   ?   Height   ?   Head Circumference   ?   Peak Flow   ?   Pain Score   ?   Pain Loc   ?   Pain Edu?   ?   Excl. in [place]?   ? ? ?Most recent vital signs: ?Vitals:  ? 10/08/21 2320 10/08/21 2340  ?BP:  91/67  ?Pulse: 64 61  ?Resp: 14 15  ?Temp: (!) 92 ?F (33.3 ?C) (!) 92.3 ?F (33.5 ?C)  ?SpO2: 95% 96%  ? ? ?General: Arousable to sternal rub and able to state name and that he is not in pain but feeling sleepy. ?CV:  Good peripheral perfusion.  2+ radial pulse ?Resp:  Normal effort.  Bilaterally. ?Abd:  No distention.  Soft. ?Other:  Left upper extremity contracted.  PERRLA.  EOMI.  Patient is moving all other extremities to noxious stimuli but does not otherwise follow commands. ? ? ?ED Results / Procedures / Treatments  ?Labs ?(all  labs ordered are listed, but only abnormal results are displayed) ?Labs Reviewed  ?BLOOD GAS, VENOUS - Abnormal; Notable for the following components:  ?    Result Value  ? Bicarbonate 32.0 (*)   ? Acid-Base Excess 4.7 (*)   ? All other components within normal limits  ?CBC WITH DIFFERENTIAL/PLATELET - Abnormal; Notable for the following components:  ? RDW 17.9 (*)   ? All other components within normal limits  ?COMPREHENSIVE METABOLIC PANEL - Abnormal; Notable for the following components:  ? Glucose, Bld 45 (*)   ? BUN 33 (*)   ? Creatinine, Ser 6.30 (*)   ? Total Protein 8.8 (*)   ? GFR, Estimated 11 (*)   ? All other components within normal limits  ?AMMONIA - Abnormal; Notable for the following components:  ? Ammonia 37 (*)   ? All other components within normal limits  ?CBG MONITORING, ED - Abnormal; Notable for the following components:  ? Glucose-Capillary 22 (*)   ? All other  components within normal limits  ?CBG MONITORING, ED - Abnormal; Notable for the following components:  ? Glucose-Capillary 65 (*)   ? All other components within normal limits  ?CBG MONITORING, ED - Abnormal; Notable for the following components:  ? Glucose-Capillary 106 (*)   ? All other components within normal limits  ?CBG MONITORING, ED - Abnormal; Notable for the following components:  ? Glucose-Capillary 37 (*)   ? All other components within normal limits  ?CBG MONITORING, ED - Abnormal; Notable for the following components:  ? Glucose-Capillary 39 (*)   ? All other components within normal limits  ?CBG MONITORING, ED - Abnormal; Notable for the following components:  ? Glucose-Capillary 100 (*)   ? All other components within normal limits  ?CULTURE, BLOOD (ROUTINE X 2)  ?CULTURE, BLOOD (ROUTINE X 2)  ?MRSA NEXT GEN BY PCR, NASAL  ?ETHANOL  ?LACTIC ACID, PLASMA  ?TSH  ?PROTIME-INR  ?APTT  ?LACTIC ACID, PLASMA  ?URINALYSIS, COMPLETE (UACMP) WITH MICROSCOPIC  ?CORTISOL  ?CBC  ?MAGNESIUM  ?PHOSPHORUS  ?AMMONIA  ?COMPREHENSIVE  METABOLIC PANEL  ?HEMOGLOBIN A1C  ?CBG MONITORING, ED  ?CBG MONITORING, ED  ?CBG MONITORING, ED  ?CBG MONITORING, ED  ?CBG MONITORING, ED  ? ? ? ?EKG ? ?EKG is remarkable sinus rhythm with a ventricular rate of 69, normal axis, unremarkable intervals without evidence of acute ischemia or significant arrhythmia. ? ? ?RADIOLOGY ? ?CT head and C-spine reviewed by myself without evidence of hemorrhage, skull fracture or acute C-spine injury.  Also reviewed radiology interpretation. ? ?Chest x-ray my interpretation without pneumothorax, large effusion or overt edema there is some increased haziness around the right apex.  Also reviewed radiology interpretation. ?PROCEDURES: ? ?Critical Care performed: Yes, see critical care procedure note(s) ? ?.Critical Care ?Performed by: Lucrezia Starch, MD ?Authorized by: Lucrezia Starch, MD  ? ?Critical care provider statement:  ?  Critical care time (minutes):  30 ?  Critical care was necessary to treat or prevent imminent or life-threatening deterioration of the following conditions:  Endocrine crisis, metabolic crisis and circulatory failure ?  Critical care was time spent personally by me on the following activities:  Development of treatment plan with patient or surrogate, discussions with consultants, evaluation of patient's response to treatment, examination of patient, ordering and review of laboratory studies, ordering and review of radiographic studies, ordering and performing treatments and interventions, pulse oximetry, re-evaluation of patient's condition and review of old charts ? ? ? ?MEDICATIONS ORDERED IN ED: ?Medications  ?vancomycin (VANCOREADY) IVPB 1500 mg/300 mL (1,500 mg Intravenous New Bag/Given 10/08/21 2306)  ?ceFEPIme (MAXIPIME) 1 g in sodium chloride 0.9 % 100 mL IVPB (has no administration in time range)  ?dextrose 10 % infusion ( Intravenous New Bag/Given 10/08/21 2317)  ?norepinephrine (LEVOPHED) 4mg  in 251mL (0.016 mg/mL) premix infusion (3 mcg/min  Intravenous Rate/Dose Change 10/08/21 2324)  ?docusate sodium (COLACE) capsule 100 mg (has no administration in time range)  ?polyethylene glycol (MIRALAX / GLYCOLAX) packet 17 g (has no administration in time range)  ?heparin injection 5,000 Units (has no administration in time range)  ?pantoprazole (PROTONIX) injection 40 mg (has no administration in time range)  ?0.9 %  sodium chloride infusion (has no administration in time range)  ?Chlorhexidine Gluconate Cloth 2 % PADS 6 each (has no administration in time range)  ?dextrose 50 % solution 50 mL (50 mLs Intravenous Given 10/08/21 2111)  ?haloperidol lactate (HALDOL) 5 MG/ML injection (5 mg  Given 10/08/21 2121)  ?LORazepam (ATIVAN) injection 2  mg (2 mg Intravenous Given 10/08/21 2142)  ?lactated ringers bolus 1,000 mL (1,000 mLs Intravenous New Bag/Given 10/08/21 2230)  ?hydrocortisone sodium succinate (SOLU-CORTEF) 100 MG injection 100 mg (100 mg Intravenous Given 10/08/21 2235)  ? ? ? ?IMPRESSION / MDM / ASSESSMENT AND PLAN / ED COURSE  ?I reviewed the triage vital signs and the nursing notes. ?             ?               ? ?Differential diagnosis includes, but is not limited to excessive insulin use in the setting of decreased p.o. intake, sepsis, CVA, intoxication, adrenal insufficiency, and hypothyroidism. ? ?On arrival patient noted to be hypoglycemic and given some D50.  Woke up a little bit and was able to tell us his name and that he is not in pain but then seem to get more confused.  He is thrashing about and required some Haldol and Ativan so that IV can be safely established and CT imaging obtained of the head and neck as well as chest x-ray. ? ?CT head and C-spine reviewed by myself without evidence of hemorrhage, skull fracture or acute C-spine injury.  Also reviewed radiology interpretation. ? ?Chest x-ray my interpretation without pneumothorax, large effusion or overt edema there is some increased haziness around the right apex.  Also reviewed  radiology interpretation. ? ?EKG is remarkable sinus rhythm with a ventricular rate of 69, normal axis, unremarkable intervals without evidence of acute ischemia or significant arrhythmia. ? ?VBG with a pH of 7.

## 2021-10-08 NOTE — ED Triage Notes (Signed)
Pt BIB EMS from home for unresponsiveness & hypoglycemia. BGL was 32 on EMS arrival. Pt received 250 of D10, BGL came to 106, then dropped again to 64. Pt responsive to verbal stimulus on arrival. ?

## 2021-10-08 NOTE — ED Notes (Signed)
Haldol 5mg  IVP given per Dr. Tamala Julian VO. ?

## 2021-10-08 NOTE — ED Notes (Signed)
IV team at bedside 

## 2021-10-08 NOTE — ED Notes (Addendum)
Per pharmacy, Vancomycin okay to run with Dextrose 10% ?

## 2021-10-09 LAB — GLUCOSE, CAPILLARY
Glucose-Capillary: 126 mg/dL — ABNORMAL HIGH (ref 70–99)
Glucose-Capillary: 130 mg/dL — ABNORMAL HIGH (ref 70–99)
Glucose-Capillary: 131 mg/dL — ABNORMAL HIGH (ref 70–99)
Glucose-Capillary: 150 mg/dL — ABNORMAL HIGH (ref 70–99)
Glucose-Capillary: 296 mg/dL — ABNORMAL HIGH (ref 70–99)
Glucose-Capillary: 343 mg/dL — ABNORMAL HIGH (ref 70–99)
Glucose-Capillary: 295 mg/dL — ABNORMAL HIGH (ref 70–99)
Glucose-Capillary: 260 mg/dL — ABNORMAL HIGH (ref 70–99)
Glucose-Capillary: 194 mg/dL — ABNORMAL HIGH (ref 70–99)
Glucose-Capillary: 240 mg/dL — ABNORMAL HIGH (ref 70–99)
Glucose-Capillary: 260 mg/dL — ABNORMAL HIGH (ref 70–99)
Glucose-Capillary: 221 mg/dL — ABNORMAL HIGH (ref 70–99)

## 2021-10-09 LAB — HEPARIN LEVEL (UNFRACTIONATED)
Heparin Unfractionated: 0.38 IU/mL (ref 0.30–0.70)
Heparin Unfractionated: 0.62 IU/mL (ref 0.30–0.70)

## 2021-10-09 LAB — APTT
aPTT: 68 seconds — ABNORMAL HIGH (ref 24–36)
aPTT: 148 seconds — ABNORMAL HIGH (ref 24–36)

## 2021-10-09 LAB — CBC
HCT: 45.1 % (ref 39.0–52.0)
Hemoglobin: 14 g/dL (ref 13.0–17.0)
MCH: 29 pg (ref 26.0–34.0)
MCHC: 31 g/dL (ref 30.0–36.0)
MCV: 93.4 fL (ref 80.0–100.0)
Platelets: 200 10*3/uL (ref 150–400)
RBC: 4.83 MIL/uL (ref 4.22–5.81)
RDW: 17.7 % — ABNORMAL HIGH (ref 11.5–15.5)
WBC: 8.8 10*3/uL (ref 4.0–10.5)
nRBC: 0 % (ref 0.0–0.2)

## 2021-10-09 LAB — COMPREHENSIVE METABOLIC PANEL
ALT: 22 U/L (ref 0–44)
AST: 20 U/L (ref 15–41)
Albumin: 3.8 g/dL (ref 3.5–5.0)
Alkaline Phosphatase: 61 U/L (ref 38–126)
Anion gap: 15 (ref 5–15)
BUN: 35 mg/dL — ABNORMAL HIGH (ref 6–20)
CO2: 24 mmol/L (ref 22–32)
Calcium: 8.5 mg/dL — ABNORMAL LOW (ref 8.9–10.3)
Chloride: 98 mmol/L (ref 98–111)
Creatinine, Ser: 6.35 mg/dL — ABNORMAL HIGH (ref 0.61–1.24)
GFR, Estimated: 11 mL/min — ABNORMAL LOW (ref >60)
Glucose, Bld: 136 mg/dL — ABNORMAL HIGH (ref 70–99)
Potassium: 4.1 mmol/L (ref 3.5–5.1)
Sodium: 137 mmol/L (ref 135–145)
Total Bilirubin: 0.8 mg/dL (ref 0.3–1.2)
Total Protein: 7.6 g/dL (ref 6.5–8.1)

## 2021-10-09 LAB — HEMOGLOBIN A1C
Hgb A1c MFr Bld: 10.4 % — ABNORMAL HIGH (ref 4.8–5.6)
Mean Plasma Glucose: 251.78 mg/dL

## 2021-10-09 LAB — PHOSPHORUS: Phosphorus: 5.8 mg/dL — ABNORMAL HIGH (ref 2.5–4.6)

## 2021-10-09 LAB — CORTISOL: Cortisol, Plasma: 71.5 ug/dL

## 2021-10-09 LAB — MRSA NEXT GEN BY PCR, NASAL: MRSA by PCR Next Gen: NOT DETECTED

## 2021-10-09 LAB — AMMONIA: Ammonia: 28 umol/L (ref 9–35)

## 2021-10-09 LAB — LACTIC ACID, PLASMA: Lactic Acid, Venous: 1 mmol/L (ref 0.5–1.9)

## 2021-10-09 LAB — MAGNESIUM: Magnesium: 2.3 mg/dL (ref 1.7–2.4)

## 2021-10-09 MED ORDER — ATORVASTATIN CALCIUM 20 MG PO TABS
80.0000 mg | ORAL_TABLET | Freq: Every evening | ORAL | Status: DC
  Administered 2021-10-09 – 2021-10-10 (×2): 80 mg via ORAL
  Filled 2021-10-09 (×2): qty 4

## 2021-10-09 MED ORDER — INSULIN GLARGINE-YFGN 100 UNIT/ML ~~LOC~~ SOLN
13.0000 [IU] | Freq: Every day | SUBCUTANEOUS | Status: DC
  Administered 2021-10-09 – 2021-10-11 (×3): 13 [IU] via SUBCUTANEOUS
  Filled 2021-10-09 (×3): qty 0.13

## 2021-10-09 MED ORDER — CLOPIDOGREL BISULFATE 75 MG PO TABS
75.0000 mg | ORAL_TABLET | Freq: Every day | ORAL | Status: DC
  Administered 2021-10-09 – 2021-10-10 (×2): 75 mg via ORAL
  Filled 2021-10-09 (×2): qty 1

## 2021-10-09 MED ORDER — PANTOPRAZOLE SODIUM 40 MG PO TBEC
80.0000 mg | DELAYED_RELEASE_TABLET | Freq: Every day | ORAL | Status: DC
  Administered 2021-10-09 – 2021-10-11 (×3): 80 mg via ORAL
  Filled 2021-10-09 (×3): qty 2

## 2021-10-09 MED ORDER — INSULIN ASPART 100 UNIT/ML IJ SOLN
0.0000 [IU] | INTRAMUSCULAR | Status: DC
  Administered 2021-10-09: 3 [IU] via SUBCUTANEOUS
  Administered 2021-10-09: 4 [IU] via SUBCUTANEOUS
  Administered 2021-10-09: 1 [IU] via SUBCUTANEOUS
  Filled 2021-10-09 (×3): qty 1

## 2021-10-09 MED ORDER — EZETIMIBE 10 MG PO TABS
10.0000 mg | ORAL_TABLET | Freq: Every day | ORAL | Status: DC
  Administered 2021-10-09 – 2021-10-11 (×3): 10 mg via ORAL
  Filled 2021-10-09 (×3): qty 1

## 2021-10-09 MED ORDER — HEPARIN (PORCINE) 25000 UT/250ML-% IV SOLN
1000.0000 [IU]/h | INTRAVENOUS | Status: AC
  Administered 2021-10-09: 1000 [IU]/h via INTRAVENOUS
  Filled 2021-10-09: qty 250

## 2021-10-09 MED ORDER — DEXTROSE 50 % IV SOLN
12.5000 g | Freq: Once | INTRAVENOUS | Status: AC
  Administered 2021-10-09: 12.5 g via INTRAVENOUS

## 2021-10-09 MED ORDER — INSULIN ASPART 100 UNIT/ML IJ SOLN
1.0000 [IU] | INTRAMUSCULAR | Status: DC
  Administered 2021-10-09: 2 [IU] via SUBCUTANEOUS
  Administered 2021-10-09 – 2021-10-11 (×3): 1 [IU] via SUBCUTANEOUS
  Filled 2021-10-09 (×4): qty 1

## 2021-10-09 MED ORDER — APIXABAN 5 MG PO TABS
5.0000 mg | ORAL_TABLET | Freq: Two times a day (BID) | ORAL | Status: DC
Start: 2021-10-09 — End: 2021-10-11
  Administered 2021-10-09 – 2021-10-10 (×3): 5 mg via ORAL
  Filled 2021-10-09 (×3): qty 1

## 2021-10-09 MED ORDER — INSULIN ASPART 100 UNIT/ML IJ SOLN
2.0000 [IU] | Freq: Three times a day (TID) | INTRAMUSCULAR | Status: DC
  Administered 2021-10-10 – 2021-10-11 (×5): 2 [IU] via SUBCUTANEOUS
  Filled 2021-10-09 (×5): qty 1

## 2021-10-09 NOTE — Consult Note (Signed)
ANTICOAGULATION CONSULT NOTE - Initial Consult ? ?Pharmacy Consult for Pulmonary  ?Indication:  Hx of PE and CVA ? ?Allergies  ?Allergen Reactions  ? Baclofen Itching and Other (See Comments)  ?  Became disoriented, emesis  ? Iodinated Contrast Media Hives  ? Lisinopril Swelling  ? Other Swelling  ? Oxybenzone   ?  Other reaction(s): Unknown  ? ? ?Patient Measurements: ?Height: 5\' 9"  (175.3 cm) ?Weight: 69.5 kg (153 lb 3.5 oz) ?IBW/kg (Calculated) : 70.7 ?Heparin Dosing Weight: 69.5 kg ? ?Vital Signs: ?Temp: 98.1 ?F (36.7 ?C) (04/16 1013) ?Temp Source: Rectal (04/16 0800) ?BP: 82/51 (04/16 1013) ?Pulse Rate: 91 (04/16 1013) ? ?Labs: ?Recent Labs  ?  10/08/21 ?2056 10/09/21 ?0216 10/09/21 ?1006  ?HGB 15.0 14.0  --   ?HCT 48.3 45.1  --   ?PLT 223 200  --   ?APTT 35  --  68*  ?LABPROT 13.6  --   --   ?INR 1.1  --   --   ?HEPARINUNFRC  --  0.38 0.62  ?CREATININE 6.30* 6.35*  --   ? ? ? ?Estimated Creatinine Clearance: 15.7 mL/min (A) (by C-G formula based on SCr of 6.35 mg/dL (H)). ? ? ?Medical History: ?Past Medical History:  ?Diagnosis Date  ? Anemia of chronic disease   ? CAD (coronary artery disease)   ? a. 11/2015 Cath: LM nl, LAD 30p, LCX nl, OM2 70-80, RCA 50-49m; b. 08/2020 MV: EF 56%. No isch/infarct; c. 10/2020 NSTEMI in setting of DKA-->Cath: LM nl, LAD nl, D2 70, LCX nl, OM2 70, OM3 70 inf branch, RCA 80p/m-->Med rx.  ? ESRD (end stage renal disease) ([place])   ? Essential hypertension   ? History of echocardiogram   ? a. 10/2020 Echo: EF >55%, nl RV fxn.  ? Mixed hyperlipidemia   ? PVD (peripheral vascular disease) ([place])   ? a. 07/2015 PTA/Stenting RSFA; b. 12/2015 LLE Fem->PTA bypass; c. 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 04/2020 Duplex: patent LLE bypass. Mild prox RSFA stenosis. Stable ABIs.  ? Stroke [facility])   ? Tobacco abuse   ? Type 1 diabetes ([place])   ? ? ?Medications:  ?Medications Prior to Admission  ?Medication Sig Dispense Refill Last Dose  ? amLODipine (NORVASC) 10 MG tablet Take 10 mg by mouth  daily.   Past Week  ? apixaban (ELIQUIS) 5 MG TABS tablet Take 5 mg by mouth 2 (two) times daily.   Past Week  ? aspirin 81 MG chewable tablet Chew 81 mg by mouth daily.   Past Week  ? atorvastatin (LIPITOR) 80 MG tablet Take 80 mg by mouth every evening.   Past Week  ? AURYXIA 1 GM 210 MG(Fe) tablet Take 420 mg by mouth 3 (three) times daily.   Past Week  ? cinacalcet (SENSIPAR) 60 MG tablet Take 120 mg by mouth daily.   Past Week  ? clopidogrel (PLAVIX) 75 MG tablet Take 75 mg by mouth daily.   Past Week  ? ezetimibe (ZETIA) 10 MG tablet Take 10 mg by mouth daily.   Past Week  ? hydrALAZINE (APRESOLINE) 50 MG tablet Take 100 mg by mouth every 8 (eight) hours.   Past Week  ? insulin glargine (LANTUS SOLOSTAR) 100 UNIT/ML Solostar Pen Inject 17 Units into the skin every morning.   Past Week  ? isosorbide mononitrate (IMDUR) 30 MG 24 hr tablet Take 1 tablet (30 mg total) by mouth 2 (two) times daily. 60 tablet 0 Past Week  ? LANTUS  SOLOSTAR 100 UNIT/ML Solostar Pen Inject 13 Units into the skin at bedtime. 15 mL 11 Past Week  ? metoprolol succinate (TOPROL-XL) 100 MG 24 hr tablet Take 100 mg by mouth daily.   Past Week  ? NOVOLOG FLEXPEN 100 UNIT/ML FlexPen Inject 5 Units into the skin 3 (three) times daily. Use per sliding scale no more then 50 units a day 15 mL 11 Past Week  ? omeprazole (PRILOSEC) 40 MG capsule Take 40 mg by mouth daily with breakfast.   Past Week  ? sertraline (ZOLOFT) 100 MG tablet Take 150 mg by mouth in the morning.   Past Week  ? BAQSIMI ONE PACK 3 MG/DOSE POWD Place 1 spray into both nostrils as directed. (Patient not taking: Reported on 10/09/2021)   Not Taking  ? carvedilol (COREG) 25 MG tablet Take 37.5 mg by mouth in the morning and at bedtime. (Patient not taking: Reported on 10/09/2021)   Not Taking  ? ergocalciferol (VITAMIN D2) 1.25 MG (50000 UT) capsule Take 50,000 Units by mouth once a week.     ? gabapentin (NEURONTIN) 300 MG capsule Take 600 mg by mouth at bedtime.     ?  hydrOXYzine (ATARAX/VISTARIL) 10 MG tablet Take 10 mg by mouth 3 (three) times daily as needed. (Patient not taking: Reported on 10/09/2021)   Not Taking  ? nitroGLYCERIN (NITROSTAT) 0.4 MG SL tablet Place 1 tablet under the tongue daily as needed.   prn  ? tiZANidine (ZANAFLEX) 2 MG tablet Take 2 mg by mouth every 6 (six) hours as needed. (Patient not taking: Reported on 10/09/2021)   Not Taking  ? ?Scheduled:  ? Chlorhexidine Gluconate Cloth  6 each Topical Q0600  ? insulin aspart  0-6 Units Subcutaneous Q4H  ? insulin glargine-yfgn  13 Units Subcutaneous Daily  ? pantoprazole (PROTONIX) IV  40 mg Intravenous QHS  ? ? ?Assessment: ?History of PE and ESRD on Eliquis PTA. Patient found unresponsive at home. Unable to provide history and unknown last dose given. Pharmacy consulted to manage heparin drip. Baseline aPTT, INR and CBC appropriate to initiate therapy. Will order baseline HL as reference point to manage drip. ? ? NO initial bolus, then okay to follow protocol. ? ?Goal of Therapy:  ?Heparin level 0.3-0.7 units/ml ?aPTT 66-102 seconds ?Monitor platelets by anticoagulation protocol: Yes ?  ?Plan:  ?4/16@1006 : HL 0.62, aPTT 68 sec. Both therapeutic x 1 ?Continue heparin infusion at 1000 units/hr ?Will check aPTT level in 8 hours and  HL daily until both levels fully correlate(HL currently on higher end while aPTT on lower end) ?Continue to monitor H&H and platelets ? ?Pearla Dubonnet, PharmD ?Clinical Pharmacist ?10/09/2021 ?11:07 AM ? ? ? ? ?

## 2021-10-09 NOTE — Progress Notes (Signed)
Assisted Beth, RN on correct placement of IVwatch due to error reading. Pump now reading correctly. IV continues to have great blood return. ?

## 2021-10-09 NOTE — Progress Notes (Addendum)
eLink Physician-Brief Progress Note ?Patient Name: Raymond Lamb ?DOB: 1985/12/16 ?MRN: [id number] ? ? ?Date of Service ? 10/09/2021  ?HPI/Events of Note ? Brief new admission note: ?36 y.o. male with a past medical history of HTN, HDL, CAD, type 1 diabetes, anemia and ESRD as well as a CVA with residual left hemibody weakness who presents EMS from home after was found unresponsive by family. Had HD today. Took excess insuline. Hypoglycemic, hypothermic in ED. ? ? ?Data: ?Reviewed ?LA 1.2, wbc normal ?Left fore arm fistula. ?EKG no obvious STEMI.  ?CTH, CT cervical spine: no bleed or #. ?CxR film seen. No obvious pneumonia, but could have some aspiration like at rt basal lun fields.  ? ?Camera: ?On room air. MAP 80, on levophed, D10 gtt. Sats 95%. ? ?Shock. Not clear reason. Could be sepsis. HD lines, volume down. LA ok, no obvious  Pneumonia. Watch for asp pneumontits in next 24 hrs or so.  ? ?Encephalopathy from severe symptomatic hypoglycemia. CVA hx. No new. ? ?PE hx. Hold eliquis . Heparin gtt here. ? ?ESRD: ?Had HD today ? ? ?  ?eICU Interventions ? - agree with NP H and P plan of care. ?- ok to hold abx for now,  follow cultures ?- on VTE: sq heparin ?- asp precautions ?Wean levo as tolerated. S/p fluids. Follow UOP. Keep MAP > 65 ?SUP: protonix. ?   ? ? ? ?Intervention Category ?Major Interventions: Sepsis - evaluation and management;Hypotension - evaluation and management ?Evaluation Type: New Patient Evaluation ? ?Raymond Lamb ?10/09/2021, 12:38 AM ?

## 2021-10-09 NOTE — Consult Note (Signed)
PHARMACY CONSULT NOTE - FOLLOW UP ? ?Pharmacy Consult for Electrolyte Monitoring and Replacement  ? ?Recent Labs: ?Potassium (mmol/L)  ?Date Value  ?10/09/2021 4.1  ? ?Magnesium (mg/dL)  ?Date Value  ?10/09/2021 2.3  ? ?Calcium (mg/dL)  ?Date Value  ?10/09/2021 8.5 (L)  ? ?Albumin (g/dL)  ?Date Value  ?10/09/2021 3.8  ? ?Phosphorus (mg/dL)  ?Date Value  ?10/09/2021 5.8 (H)  ? ?Sodium (mmol/L)  ?Date Value  ?10/09/2021 137  ? ? ? ?Assessment: ?Pharmacy has been consulted to monitor and replace electrolytes in 36yo patient admitted after being found unresponsive by family member. Patient had hemodialysis session earlier in the day on 4/15 and did not eat after, which likely led to his hypoglycemia. ? ?IVF:  NS@10 -27ml/h ? ?Goal of Therapy:  ?Electrolytes WNL ? ?Plan:  ?No supplementation currently warranted ?Will recheck electrolytes with AM labs ? ?Pearla Dubonnet ,PharmD ?Clinical Pharmacist ?10/09/2021 10:07 AM ? ?

## 2021-10-09 NOTE — Progress Notes (Signed)
An USGPIV (ultrasound guided PIV) has been placed for short-term vasopressor infusion. A correctly placed ivWatch must be used when administering Vasopressors. Should this treatment be needed beyond 72 hours, central line access should be obtained.  It will be the responsibility of the bedside nurse to follow best practice to prevent extravasations.   ?

## 2021-10-09 NOTE — Consult Note (Addendum)
ANTICOAGULATION CONSULT NOTE - Initial Consult ? ?Pharmacy Consult for Pulmonary  ?Indication:  Hx of PE and CVA ? ?Allergies  ?Allergen Reactions  ? Baclofen Itching and Other (See Comments)  ?  Became disoriented, emesis  ? Iodinated Contrast Media Hives  ? Lisinopril Swelling  ? Other Swelling  ? Oxybenzone   ?  Other reaction(s): Unknown  ? ? ?Patient Measurements: ?Height: 5\' 9"  (175.3 cm) ?Weight: 69.5 kg (153 lb 3.2 oz) ?IBW/kg (Calculated) : 70.7 ?Heparin Dosing Weight: 69.5 kg ? ?Vital Signs: ?Temp: 92.3 ?F (33.5 ?C) (04/15 2340) ?Temp Source: Rectal (04/15 2125) ?BP: 91/67 (04/15 2340) ?Pulse Rate: 61 (04/15 2340) ? ?Labs: ?Recent Labs  ?  10/08/21 ?2056  ?HGB 15.0  ?HCT 48.3  ?PLT 223  ?APTT 35  ?LABPROT 13.6  ?INR 1.1  ?CREATININE 6.30*  ? ? ?Estimated Creatinine Clearance: 15.8 mL/min (A) (by C-G formula based on SCr of 6.3 mg/dL (H)). ? ? ?Medical History: ?Past Medical History:  ?Diagnosis Date  ? Anemia of chronic disease   ? CAD (coronary artery disease)   ? a. 11/2015 Cath: LM nl, LAD 30p, LCX nl, OM2 70-80, RCA 50-15m; b. 08/2020 MV: EF 56%. No isch/infarct; c. 10/2020 NSTEMI in setting of DKA-->Cath: LM nl, LAD nl, D2 70, LCX nl, OM2 70, OM3 70 inf branch, RCA 80p/m-->Med rx.  ? ESRD (end stage renal disease) ([place])   ? Essential hypertension   ? History of echocardiogram   ? a. 10/2020 Echo: EF >55%, nl RV fxn.  ? Mixed hyperlipidemia   ? PVD (peripheral vascular disease) ([place])   ? a. 07/2015 PTA/Stenting RSFA; b. 12/2015 LLE Fem->PTA bypass; c. 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 04/2020 Duplex: patent LLE bypass. Mild prox RSFA stenosis. Stable ABIs.  ? Stroke [facility])   ? Tobacco abuse   ? Type 1 diabetes ([place])   ? ? ?Medications:  ?Medications Prior to Admission  ?Medication Sig Dispense Refill Last Dose  ? apixaban (ELIQUIS) 5 MG TABS tablet Take 5 mg by mouth 2 (two) times daily.     ? ezetimibe (ZETIA) 10 MG tablet Take 10 mg by mouth daily.     ? insulin glargine (LANTUS SOLOSTAR) 100  UNIT/ML Solostar Pen Inject 17 Units into the skin every morning.     ? metoprolol succinate (TOPROL-XL) 100 MG 24 hr tablet Take 100 mg by mouth daily.     ? omeprazole (PRILOSEC) 40 MG capsule Take 40 mg by mouth daily with breakfast.     ? tiZANidine (ZANAFLEX) 2 MG tablet Take 2 mg by mouth every 6 (six) hours as needed.     ? amLODipine (NORVASC) 10 MG tablet Take 10 mg by mouth daily.     ? aspirin 81 MG chewable tablet Chew 81 mg by mouth daily.     ? atorvastatin (LIPITOR) 80 MG tablet Take 80 mg by mouth every evening.     ? AURYXIA 1 GM 210 MG(Fe) tablet Take 420 mg by mouth 3 (three) times daily.     ? BAQSIMI ONE PACK 3 MG/DOSE POWD Place 1 spray into both nostrils as directed.     ? carvedilol (COREG) 25 MG tablet Take 37.5 mg by mouth in the morning and at bedtime.     ? cinacalcet (SENSIPAR) 60 MG tablet Take 120 mg by mouth daily.     ? clopidogrel (PLAVIX) 75 MG tablet Take 75 mg by mouth daily.     ? ergocalciferol (VITAMIN D2)  1.25 MG (50000 UT) capsule Take 50,000 Units by mouth once a week.     ? gabapentin (NEURONTIN) 300 MG capsule Take 600 mg by mouth at bedtime.     ? hydrALAZINE (APRESOLINE) 50 MG tablet Take 100 mg by mouth every 8 (eight) hours.     ? hydrOXYzine (ATARAX/VISTARIL) 10 MG tablet Take 10 mg by mouth 3 (three) times daily as needed.     ? isosorbide mononitrate (IMDUR) 30 MG 24 hr tablet Take 1 tablet (30 mg total) by mouth 2 (two) times daily. 60 tablet 0   ? LANTUS SOLOSTAR 100 UNIT/ML Solostar Pen Inject 13 Units into the skin at bedtime. 15 mL 11   ? nitroGLYCERIN (NITROSTAT) 0.4 MG SL tablet Place 1 tablet under the tongue daily as needed.     ? NOVOLOG FLEXPEN 100 UNIT/ML FlexPen Inject 5 Units into the skin 3 (three) times daily. Use per sliding scale no more then 50 units a day 15 mL 11   ? sertraline (ZOLOFT) 100 MG tablet Take 150 mg by mouth in the morning.     ? ?Scheduled:  ? Chlorhexidine Gluconate Cloth  6 each Topical Q0600  ? pantoprazole (PROTONIX) IV  40 mg  Intravenous QHS  ? ? ?Assessment: ?History of PE and ESRD on Eliquis PTA. Patient found unresponsive at home. Unable to provide history and unknown last dose given. Pharmacy consulted to manage heparin drip. Baseline aPTT, INR and CBC appropriate to initiate therapy. Will order baseline HL as reference point to manage drip. ? ? NO initial bolus, then okay to follow protocol. ? ?Goal of Therapy:  ?Heparin level 0.3-0.7 units/ml ?aPTT 66-102 seconds ?Monitor platelets by anticoagulation protocol: Yes ?  ?Plan:  ?Start heparin infusion at 1000 units/hr ?Check anti-Xa level in 8 hours and daily while on heparin ?Continue to monitor H&H and platelets ? ?Temple Ewart Rodriguez-Guzman PharmD, BCPS ?10/09/2021 1:43 AM ? ? ? ?

## 2021-10-09 NOTE — Progress Notes (Addendum)
Inpatient Diabetes Program Recommendations ? ?AACE/ADA: New Consensus Statement on Inpatient Glycemic Control (2015) ? ?Target Ranges:  Prepandial:   less than 140 mg/dL ?     Peak postprandial:   less than 180 mg/dL (1-2 hours) ?     Critically ill patients:  140 - 180 mg/dL  ? ?Lab Results  ?Component Value Date  ? GLUCAP 295 (H) 10/09/2021  ? HGBA1C 10.4 (H) 10/09/2021  ? ? ?Review of Glycemic Control ? Latest Reference Range & Units 10/09/21 06:05 10/09/21 08:13 10/09/21 10:01  ?Glucose-Capillary 70 - 99 mg/dL 296 (H) 343 (H) 295 (H)  ? ?Diabetes history: DM 1 ?Outpatient Diabetes medications:  ?Lantus 17 units q AM, Novolog ?Current orders for Inpatient glycemic control:  ?Novolog 0-6 units q 4 hours, Semglee 13 units daily ? ?Inpatient Diabetes Program Recommendations:   ? ?Per last note from Endocrinology at [facility] patient should be taking the following regimen:  ?Decrease your Lantus to 13 units. Take this EVERY DAY regardless of what else is going on. ?- Take Novolog 2 units with meals. This is ONLY if you eat. In addition to this, you should add: ?- 1 unit if your pre-meal blood sugar is 150-250 ?- 2 units if your pre-meal blood sugar is 251-350 ?- 3 units if your pre-meal blood sugar is 351-450 ?- 4 units if your pre-meal blood sugar is 451+ ? ?Will update medication reconciliation. Note patient does wear CGM as well and has been referred for possible insulin pump/closed loop. Will follow patient.  ? ?Thanks,  ?Adah Perl, RN, BC-ADM ?Inpatient Diabetes Coordinator ?Pager [phone]   ? ? ? ?

## 2021-10-09 NOTE — Progress Notes (Signed)
[facility] ?[place], [place] ?10/09/21 ? ?Subjective:  ? ?LOS: 1 ? ?Patient was brought in to be emergency room by EMS after he was found unresponsive at home.  Patient is insulin dependent diabetic (type 1).  EMS found his blood sugars to be low which required dextrose boluses.  Patient was still minimally responsive therefore was brought to the emergency room for evaluation.  In the ER he was also found to be hypotensive and hypothermic and received fluid resuscitation.  He was given IV steroids, started on broad-spectrum antibiotics as well as Levophed drip. ?Last HD was  on Saturday ? ?Objective:  ?Vital signs in last 24 hours:  ?Temp:  [91.3 ?F (32.9 ?C)-97.7 ?F (36.5 ?C)] 97.7 ?F (36.5 ?C) (04/16 0800) ?Pulse Rate:  [57-85] 85 (04/16 0800) ?Resp:  [12-20] 14 (04/16 0800) ?BP: (79-111)/(45-76) 97/61 (04/16 0800) ?SpO2:  [91 %-100 %] 95 % (04/16 0800) ?Weight:  [69.5 kg] 69.5 kg (04/16 0244) ? ?Weight change:  ?Filed Weights  ? 10/08/21 2100 10/09/21 0244  ?Weight: 69.5 kg 69.5 kg  ? ? ?Intake/Output: ?  ? ?Intake/Output Summary (Last 24 hours) at 10/09/2021 0936 ?Last data filed at 10/09/2021 3532 ?Gross per 24 hour  ?Intake 934.82 ml  ?Output --  ?Net 934.82 ml  ? ? ?Physical Exam: ?General:  No acute distress, laying in the bed  ?HEENT  anicteric, moist oral mucous membrane  ?Pulm/lungs  normal breathing effort, lungs are clear to auscultation  ?CVS/Heart  regular rhythm, no rub or gallop  ?Abdomen:   Soft, nontender  ?Extremities:  No peripheral edema  ?Neurologic:  Alert, oriented, able to follow commands  ?Skin:  No acute rashes  ?Left arm AVF ? ?Basic Metabolic Panel: ? ?Recent Labs  ?Lab 10/08/21 ?2056 10/09/21 ?0216  ?NA 139 137  ?K 3.8 4.1  ?CL 99 98  ?CO2 26 24  ?GLUCOSE 45* 136*  ?BUN 33* 35*  ?CREATININE 6.30* 6.35*  ?CALCIUM 9.1 8.5*  ?MG  --  2.3  ?PHOS  --  5.8*  ? ? ? ?CBC: ?Recent Labs  ?Lab 10/08/21 ?2056 10/09/21 ?0216  ?WBC 9.3 8.8  ?NEUTROABS 5.4  --   ?HGB 15.0 14.0  ?HCT  48.3 45.1  ?MCV 95.3 93.4  ?PLT 223 200  ? ? ?  ?Lab Results  ?Component Value Date  ? HEPBSAG NON REACTIVE 12/02/2020  ? ? ? ? ?Microbiology: ? ?Recent Results (from the past 240 hour(s))  ?Blood Culture (routine x 2)     Status: None (Preliminary result)  ? Collection Time: 10/08/21 10:23 PM  ? Specimen: BLOOD  ?Result Value Ref Range Status  ? Specimen Description BLOOD BLOOD RIGHT WRIST  Final  ? Special Requests   Final  ?  BOTTLES DRAWN AEROBIC AND ANAEROBIC Blood Culture adequate volume  ? Culture   Final  ?  NO GROWTH < 12 HOURS ?Performed at [facility], [address], [place], [place] [address] ?  ? Report Status PENDING  Incomplete  ?Blood Culture (routine x 2)     Status: None (Preliminary result)  ? Collection Time: 10/08/21 11:05 PM  ? Specimen: BLOOD  ?Result Value Ref Range Status  ? Specimen Description BLOOD BLOOD RIGHT HAND  Final  ? Special Requests   Final  ?  BOTTLES DRAWN AEROBIC AND ANAEROBIC Blood Culture adequate volume  ? Culture   Final  ?  NO GROWTH < 12 HOURS ?Performed at [facility], [address] [place], [place] [address] ?  ?  Report Status PENDING  Incomplete  ?MRSA Next Gen by PCR, Nasal     Status: None  ? Collection Time: 10/09/21 12:44 AM  ? Specimen: Nasal Mucosa; Nasal Swab  ?Result Value Ref Range Status  ? MRSA by PCR Next Gen NOT DETECTED NOT DETECTED Final  ?  Comment: (NOTE) ?The GeneXpert MRSA Assay (FDA approved for NASAL specimens only), ?is one component of a comprehensive MRSA colonization surveillance ?program. It is not intended to diagnose MRSA infection nor to guide ?or monitor treatment for MRSA infections. ?Test performance is not FDA approved in patients less than 2 years ?old. ?Performed at [facility], [place], ?[place] [address] ?  ? ? ?Coagulation Studies: ?Recent Labs  ?  10/08/21 ?2056  ?LABPROT 13.6  ?INR 1.1  ? ? ?Urinalysis: ?No results for input(s): COLORURINE, LABSPEC, [place], GLUCOSEU, HGBUR,  BILIRUBINUR, KETONESUR, PROTEINUR, UROBILINOGEN, NITRITE, LEUKOCYTESUR in the last 72 hours. ? ?Invalid input(s): APPERANCEUR  ? ? ?Imaging: ?CT HEAD WO CONTRAST (5MM) ? ?Result Date: 10/08/2021 ?CLINICAL DATA:  Mental status change, unknown cause; foudn down EXAM: CT HEAD WITHOUT CONTRAST CT CERVICAL SPINE WITHOUT CONTRAST TECHNIQUE: Multidetector CT imaging of the head and cervical spine was performed following the standard protocol without intravenous contrast. Multiplanar CT image reconstructions of the cervical spine were also generated. RADIATION DOSE REDUCTION: This exam was performed according to the departmental dose-optimization program which includes automated exposure control, adjustment of the mA and/or kV according to patient size and/or use of iterative reconstruction technique. COMPARISON:  None. FINDINGS: CT HEAD FINDINGS Brain: No evidence of large-territorial acute infarction. No parenchymal hemorrhage. No mass lesion. No extra-axial collection. No mass effect or midline shift. No hydrocephalus. Basilar cisterns are patent. Vascular: No hyperdense vessel. Atherosclerotic calcifications are present within the cavernous internal carotid and vertebral arteries. Skull: No acute fracture or focal lesion. Sinuses/Orbits: Most complete opacification of the left maxillary sinus. Paranasal sinuses and mastoid air cells are clear. The orbits are unremarkable. Other: Nonspecific right occipital scalp calcification (3:29). CT CERVICAL SPINE FINDINGS Alignment: Normal. Skull base and vertebrae: Multilevel mild degenerative changes of the spine. No acute fracture. No aggressive appearing focal osseous lesion or focal pathologic process. Soft tissues and spinal canal: No prevertebral fluid or swelling. No visible canal hematoma. Upper chest: Unremarkable. Other: None. IMPRESSION: 1. No acute intracranial abnormality. 2. No acute displaced fracture or traumatic listhesis of the cervical spine. 3. Nonspecific right  occipital scalp calcification. Correlate with physical exam. 4. Left maxillary sinus disease. Electronically Signed   By: Iven Finn M.D.   On: 10/08/2021 22:21  ? ?CT Cervical Spine Wo Contrast ? ?Result Date: 10/08/2021 ?CLINICAL DATA:  Mental status change, unknown cause; foudn down EXAM: CT HEAD WITHOUT CONTRAST CT CERVICAL SPINE WITHOUT CONTRAST TECHNIQUE: Multidetector CT imaging of the head and cervical spine was performed following the standard protocol without intravenous contrast. Multiplanar CT image reconstructions of the cervical spine were also generated. RADIATION DOSE REDUCTION: This exam was performed according to the departmental dose-optimization program which includes automated exposure control, adjustment of the mA and/or kV according to patient size and/or use of iterative reconstruction technique. COMPARISON:  None. FINDINGS: CT HEAD FINDINGS Brain: No evidence of large-territorial acute infarction. No parenchymal hemorrhage. No mass lesion. No extra-axial collection. No mass effect or midline shift. No hydrocephalus. Basilar cisterns are patent. Vascular: No hyperdense vessel. Atherosclerotic calcifications are present within the cavernous internal carotid and vertebral arteries. Skull: No acute fracture or focal lesion. Sinuses/Orbits: Most  complete opacification of the left maxillary sinus. Paranasal sinuses and mastoid air cells are clear. The orbits are unremarkable. Other: Nonspecific right occipital scalp calcification (3:29). CT CERVICAL SPINE FINDINGS Alignment: Normal. Skull base and vertebrae: Multilevel mild degenerative changes of the spine. No acute fracture. No aggressive appearing focal osseous lesion or focal pathologic process. Soft tissues and spinal canal: No prevertebral fluid or swelling. No visible canal hematoma. Upper chest: Unremarkable. Other: None. IMPRESSION: 1. No acute intracranial abnormality. 2. No acute displaced fracture or traumatic listhesis of the  cervical spine. 3. Nonspecific right occipital scalp calcification. Correlate with physical exam. 4. Left maxillary sinus disease. Electronically Signed   By: Iven Finn M.D.   On: 10/08/2021 22:21

## 2021-10-10 ENCOUNTER — Other Ambulatory Visit: Payer: Self-pay

## 2021-10-10 DIAGNOSIS — E162 Hypoglycemia, unspecified: Secondary | ICD-10-CM | POA: Diagnosis not present

## 2021-10-10 LAB — BASIC METABOLIC PANEL
Anion gap: 23 — ABNORMAL HIGH (ref 5–15)
BUN: 67 mg/dL — ABNORMAL HIGH (ref 6–20)
CO2: 21 mmol/L — ABNORMAL LOW (ref 22–32)
Calcium: 8.9 mg/dL (ref 8.9–10.3)
Chloride: 95 mmol/L — ABNORMAL LOW (ref 98–111)
Creatinine, Ser: 9.71 mg/dL — ABNORMAL HIGH (ref 0.61–1.24)
GFR, Estimated: 6 mL/min — ABNORMAL LOW (ref >60)
Glucose, Bld: 94 mg/dL (ref 70–99)
Potassium: 4.3 mmol/L (ref 3.5–5.1)
Sodium: 139 mmol/L (ref 135–145)

## 2021-10-10 LAB — CBC
HCT: 42.5 % (ref 39.0–52.0)
Hemoglobin: 13.7 g/dL (ref 13.0–17.0)
MCH: 30 pg (ref 26.0–34.0)
MCHC: 32.2 g/dL (ref 30.0–36.0)
MCV: 93.2 fL (ref 80.0–100.0)
Platelets: 202 10*3/uL (ref 150–400)
RBC: 4.56 MIL/uL (ref 4.22–5.81)
RDW: 17.4 % — ABNORMAL HIGH (ref 11.5–15.5)
WBC: 8.9 10*3/uL (ref 4.0–10.5)
nRBC: 0 % (ref 0.0–0.2)

## 2021-10-10 LAB — GLUCOSE, CAPILLARY
Glucose-Capillary: 104 mg/dL — ABNORMAL HIGH (ref 70–99)
Glucose-Capillary: 104 mg/dL — ABNORMAL HIGH (ref 70–99)
Glucose-Capillary: 106 mg/dL — ABNORMAL HIGH (ref 70–99)
Glucose-Capillary: 113 mg/dL — ABNORMAL HIGH (ref 70–99)
Glucose-Capillary: 187 mg/dL — ABNORMAL HIGH (ref 70–99)
Glucose-Capillary: 65 mg/dL — ABNORMAL LOW (ref 70–99)
Glucose-Capillary: 80 mg/dL (ref 70–99)

## 2021-10-10 LAB — MAGNESIUM: Magnesium: 2.3 mg/dL (ref 1.7–2.4)

## 2021-10-10 LAB — PHOSPHORUS: Phosphorus: 9.3 mg/dL — ABNORMAL HIGH (ref 2.5–4.6)

## 2021-10-10 NOTE — Progress Notes (Signed)
?PROGRESS NOTE ? ? ? ?Raymond Lamb  GQQ:[id number] DOB: 20-Jun-1986 DOA: 10/08/2021 ?PCP: Norina Buzzard, MD  ? ? ?Brief Narrative:  ?36 yo M presenting to [facility] ED on 10/08/21 from home via EMS after being found unresponsive at the home he shares with his mother by his cousin. History provided by his mother, Derek Jack, via telephone. She reports the patient was in his normal state of health. He got back from an ?iHD session, tired which is usual for him and was getting ready to eat when she left. She reports that often after HD patient is lethargic and sleeps so soundly he does not check his sugar. Mom concerned that maybe he didn't eat enough. She confirmed that he uses SQ insulin, but wears a monitor and provided his CBG readings from the afternoon of 4/15: 15:30- 278, 16:13-384, 17:30-93, 18:40-101, 19:30-45, 19:55-low. ?Her nephew went to check on the patient but couldn't get a response at the front door and climbed in through the window, finding him unresponsive, calling EMS. Per ED documentation, EMS gave the patient 2 D 10 boluses which improved his blood glucose to 100 but the patient was still minimally responsive.  ?ED course: ?Per ED documentation, on arrival the patient was only able to verbally respond with his name. Patient remained severely hypoglycemic at 22 and received an amp of D 50. After this administration he became more responsive able to answer some questions, but subsequently more confused. He was given ativan & haldol in order to safely place an IV & obtain CT imaging. Blood glucose had stabilized, then dropped again from 106 to the 30's and the patient was placed on a D 10 infusion. Patient also hypotensive at 82/54 and hypothermic at 92.2, receiving 1 L of LR and bear hugger placement.  ? ?4/17 TRH pickup today ? ?Consultants:  ?PCCM, nephrology ? ?Procedures:  ? ?Antimicrobials:  ?  ? ? ?Subjective: ?Patient has no complaints.  Denies shortness of breath, dizziness or  lightheadedness ? ?Objective: ?Vitals:  ? 10/10/21 0900 10/10/21 1000 10/10/21 1100 10/10/21 1200  ?BP: 118/83 123/77 121/80 108/68  ?Pulse:      ?Resp: (!) 35 16 (!) 29 (!) 21  ?Temp:      ?TempSrc:      ?SpO2:      ?Weight:      ?Height:      ? ? ?Intake/Output Summary (Last 24 hours) at 10/10/2021 1612 ?Last data filed at 10/10/2021 1300 ?Gross per 24 hour  ?Intake 803.58 ml  ?Output --  ?Net 803.58 ml  ? ?Filed Weights  ? 10/08/21 2100 10/09/21 0244 10/10/21 0500  ?Weight: 69.5 kg 69.5 kg 69.5 kg  ? ? ?Examination: ?Calm, NAD ?Cta no w/r ?Reg s1/s2 no gallop ?Soft benign +bs ?No edema ?Awake and alert, grossly intact answering appropriately ?Mood and affect appropriate in current setting  ? ? ? ?Data Reviewed: I have personally reviewed following labs and imaging studies ? ?CBC: ?Recent Labs  ?Lab 10/08/21 ?2056 10/09/21 ?0216 10/10/21 ?0444  ?WBC 9.3 8.8 8.9  ?NEUTROABS 5.4  --   --   ?HGB 15.0 14.0 13.7  ?HCT 48.3 45.1 42.5  ?MCV 95.3 93.4 93.2  ?PLT 223 200 202  ? ?Basic Metabolic Panel: ?Recent Labs  ?Lab 10/08/21 ?2056 10/09/21 ?0216 10/10/21 ?0444  ?NA 139 137 139  ?K 3.8 4.1 4.3  ?CL 99 98 95*  ?CO2 26 24 21*  ?GLUCOSE 45* 136* 94  ?BUN 33* 35* 67*  ?CREATININE 6.30* 6.35* 9.71*  ?  CALCIUM 9.1 8.5* 8.9  ?MG  --  2.3 2.3  ?PHOS  --  5.8* 9.3*  ? ?GFR: ?Estimated Creatinine Clearance: 10.2 mL/min (A) (by C-G formula based on SCr of 9.71 mg/dL (H)). ?Liver Function Tests: ?Recent Labs  ?Lab 10/08/21 ?2056 10/09/21 ?0216  ?AST 26 20  ?ALT 28 22  ?ALKPHOS 70 61  ?BILITOT 1.1 0.8  ?PROT 8.8* 7.6  ?ALBUMIN 4.2 3.8  ? ?No results for input(s): LIPASE, AMYLASE in the last 168 hours. ?Recent Labs  ?Lab 10/08/21 ?2106 10/09/21 ?0216  ?AMMONIA 37* 28  ? ?Coagulation Profile: ?Recent Labs  ?Lab 10/08/21 ?2056  ?INR 1.1  ? ?Cardiac Enzymes: ?No results for input(s): CKTOTAL, CKMB, CKMBINDEX, TROPONINI in the last 168 hours. ?BNP (last 3 results) ?No results for input(s): PROBNP in the last 8760 hours. ?HbA1C: ?Recent Labs   ?  10/09/21 ?0110  ?HGBA1C 10.4*  ? ?CBG: ?Recent Labs  ?Lab 10/09/21 ?2318 10/10/21 ?6283 10/10/21 ?0734 10/10/21 ?1150 10/10/21 ?1544  ?GLUCAP 221* 104* 113* 104* 106*  ? ?Lipid Profile: ?No results for input(s): CHOL, HDL, LDLCALC, TRIG, CHOLHDL, LDLDIRECT in the last 72 hours. ?Thyroid Function Tests: ?Recent Labs  ?  10/08/21 ?2056  ?TSH 2.503  ? ?Anemia Panel: ?No results for input(s): VITAMINB12, FOLATE, FERRITIN, TIBC, IRON, RETICCTPCT in the last 72 hours. ?Sepsis Labs: ?Recent Labs  ?Lab 10/08/21 ?2106 10/09/21 ?0110  ?LATICACIDVEN 1.2 1.0  ? ? ?Recent Results (from the past 240 hour(s))  ?Blood Culture (routine x 2)     Status: None (Preliminary result)  ? Collection Time: 10/08/21 10:23 PM  ? Specimen: BLOOD  ?Result Value Ref Range Status  ? Specimen Description BLOOD BLOOD RIGHT WRIST  Final  ? Special Requests   Final  ?  BOTTLES DRAWN AEROBIC AND ANAEROBIC Blood Culture adequate volume  ? Culture   Final  ?  NO GROWTH 2 DAYS ?Performed at [facility], [address], [place], [place] [address] ?  ? Report Status PENDING  Incomplete  ?Blood Culture (routine x 2)     Status: None (Preliminary result)  ? Collection Time: 10/08/21 11:05 PM  ? Specimen: BLOOD  ?Result Value Ref Range Status  ? Specimen Description BLOOD BLOOD RIGHT HAND  Final  ? Special Requests   Final  ?  BOTTLES DRAWN AEROBIC AND ANAEROBIC Blood Culture adequate volume  ? Culture   Final  ?  NO GROWTH 2 DAYS ?Performed at [facility], [address] [place], [place] [address] ?  ? Report Status PENDING  Incomplete  ?MRSA Next Gen by PCR, Nasal     Status: None  ? Collection Time: 10/09/21 12:44 AM  ? Specimen: Nasal Mucosa; Nasal Swab  ?Result Value Ref Range Status  ? MRSA by PCR Next Gen NOT DETECTED NOT DETECTED Final  ?  Comment: (NOTE) ?The GeneXpert MRSA Assay (FDA approved for NASAL specimens only), ?is one component of a comprehensive MRSA colonization surveillance ?program. It is not intended to  diagnose MRSA infection nor to guide ?or monitor treatment for MRSA infections. ?Test performance is not FDA approved in patients less than 2 years ?old. ?Performed at [facility], [place], ?[place] [address] ?  ?  ? ? ? ? ? ?Radiology Studies: ?CT HEAD WO CONTRAST (5MM) ? ?Result Date: 10/08/2021 ?CLINICAL DATA:  Mental status change, unknown cause; foudn down EXAM: CT HEAD WITHOUT CONTRAST CT CERVICAL SPINE WITHOUT CONTRAST TECHNIQUE: Multidetector CT imaging of the head and cervical  spine was performed following the standard protocol without intravenous contrast. Multiplanar CT image reconstructions of the cervical spine were also generated. RADIATION DOSE REDUCTION: This exam was performed according to the departmental dose-optimization program which includes automated exposure control, adjustment of the mA and/or kV according to patient size and/or use of iterative reconstruction technique. COMPARISON:  None. FINDINGS: CT HEAD FINDINGS Brain: No evidence of large-territorial acute infarction. No parenchymal hemorrhage. No mass lesion. No extra-axial collection. No mass effect or midline shift. No hydrocephalus. Basilar cisterns are patent. Vascular: No hyperdense vessel. Atherosclerotic calcifications are present within the cavernous internal carotid and vertebral arteries. Skull: No acute fracture or focal lesion. Sinuses/Orbits: Most complete opacification of the left maxillary sinus. Paranasal sinuses and mastoid air cells are clear. The orbits are unremarkable. Other: Nonspecific right occipital scalp calcification (3:29). CT CERVICAL SPINE FINDINGS Alignment: Normal. Skull base and vertebrae: Multilevel mild degenerative changes of the spine. No acute fracture. No aggressive appearing focal osseous lesion or focal pathologic process. Soft tissues and spinal canal: No prevertebral fluid or swelling. No visible canal hematoma. Upper chest: Unremarkable. Other: None. IMPRESSION: 1.  No acute intracranial abnormality. 2. No acute displaced fracture or traumatic listhesis of the cervical spine. 3. Nonspecific right occipital scalp calcification. Correlate with physical exam. 4. Left maxillary sinus disea

## 2021-10-10 NOTE — Progress Notes (Signed)
[facility] ?[place], [place] ?10/10/21 ? ?Subjective:  ? ?LOS: 2 ? ?Patient was brought in to be emergency room by EMS after he was found unresponsive at home.  Patient is insulin dependent diabetic (type 1).  EMS found his blood sugars to be low which required dextrose boluses.  Patient was still minimally responsive therefore was brought to the emergency room for evaluation.  In the ER he was also found to be hypotensive and hypothermic and received fluid resuscitation.  He was given IV steroids, started on broad-spectrum antibiotics as well as Levophed drip. ? ?Today, he feels well and states that he is able to eat without any nausea or vomiting.  No shortness of breath.  No leg edema.  He is anticipating discharge today. ? ?Objective:  ?Vital signs in last 24 hours:  ?Temp:  [95.7 ?F (35.4 ?C)-98.4 ?F (36.9 ?C)] 98.3 ?F (36.8 ?C) (04/17 0400) ?Pulse Rate:  [91-109] 109 (04/16 1400) ?Resp:  [13-23] 13 (04/17 0600) ?BP: (79-144)/(51-95) 113/80 (04/17 0600) ?SpO2:  [93 %-95 %] 95 % (04/16 1400) ?Weight:  [69.5 kg] 69.5 kg (04/17 0500) ? ?Weight change: 0.009 kg ?Filed Weights  ? 10/08/21 2100 10/09/21 0244 10/10/21 0500  ?Weight: 69.5 kg 69.5 kg 69.5 kg  ? ? ?Intake/Output: ?  ? ?Intake/Output Summary (Last 24 hours) at 10/10/2021 0917 ?Last data filed at 10/09/2021 2200 ?Gross per 24 hour  ?Intake 156.76 ml  ?Output --  ?Net 156.76 ml  ? ? ? ?Physical Exam: ?General:  No acute distress, laying in the bed  ?HEENT  anicteric, moist oral mucous membrane  ?Pulm/lungs  normal breathing effort, lungs are clear to auscultation  ?CVS/Heart  regular rhythm, no rub or gallop  ?Abdomen:   Soft, nontender  ?Extremities:  No peripheral edema  ?Neurologic:  Alert, oriented, able to follow commands  ?Skin:  No acute rashes  ?Left arm AVF, good bruit and thrill ? ?Basic Metabolic Panel: ? ?Recent Labs  ?Lab 10/08/21 ?2056 10/09/21 ?0216 10/10/21 ?0444  ?NA 139 137 139  ?K 3.8 4.1 4.3  ?CL 99 98 95*  ?CO2 26 24  21*  ?GLUCOSE 45* 136* 94  ?BUN 33* 35* 67*  ?CREATININE 6.30* 6.35* 9.71*  ?CALCIUM 9.1 8.5* 8.9  ?MG  --  2.3 2.3  ?PHOS  --  5.8* 9.3*  ? ? ? ? ?CBC: ?Recent Labs  ?Lab 10/08/21 ?2056 10/09/21 ?0216 10/10/21 ?0444  ?WBC 9.3 8.8 8.9  ?NEUTROABS 5.4  --   --   ?HGB 15.0 14.0 13.7  ?HCT 48.3 45.1 42.5  ?MCV 95.3 93.4 93.2  ?PLT 223 200 202  ? ? ? ?  ?Lab Results  ?Component Value Date  ? HEPBSAG NON REACTIVE 12/02/2020  ? ? ? ? ?Microbiology: ? ?Recent Results (from the past 240 hour(s))  ?Blood Culture (routine x 2)     Status: None (Preliminary result)  ? Collection Time: 10/08/21 10:23 PM  ? Specimen: BLOOD  ?Result Value Ref Range Status  ? Specimen Description BLOOD BLOOD RIGHT WRIST  Final  ? Special Requests   Final  ?  BOTTLES DRAWN AEROBIC AND ANAEROBIC Blood Culture adequate volume  ? Culture   Final  ?  NO GROWTH 2 DAYS ?Performed at [facility], [address] [place], [place] [address] ?  ? Report Status PENDING  Incomplete  ?Blood Culture (routine x 2)     Status: None (Preliminary result)  ? Collection Time: 10/08/21 11:05 PM  ? Specimen: BLOOD  ?  Result Value Ref Range Status  ? Specimen Description BLOOD BLOOD RIGHT HAND  Final  ? Special Requests   Final  ?  BOTTLES DRAWN AEROBIC AND ANAEROBIC Blood Culture adequate volume  ? Culture   Final  ?  NO GROWTH 2 DAYS ?Performed at [facility], [address] [place], [place] [address] ?  ? Report Status PENDING  Incomplete  ?MRSA Next Gen by PCR, Nasal     Status: None  ? Collection Time: 10/09/21 12:44 AM  ? Specimen: Nasal Mucosa; Nasal Swab  ?Result Value Ref Range Status  ? MRSA by PCR Next Gen NOT DETECTED NOT DETECTED Final  ?  Comment: (NOTE) ?The GeneXpert MRSA Assay (FDA approved for NASAL specimens only), ?is one component of a comprehensive MRSA colonization surveillance ?program. It is not intended to diagnose MRSA infection nor to guide ?or monitor treatment for MRSA infections. ?Test performance is not FDA approved  in patients less than 2 years ?old. ?Performed at [facility], [place], ?[place] [address] ?  ? ? ?Coagulation Studies: ?Recent Labs  ?  10/08/21 ?2056  ?LABPROT 13.6  ?INR 1.1  ? ? ? ?Urinalysis: ?No results for input(s): COLORURINE, LABSPEC, [place], GLUCOSEU, HGBUR, BILIRUBINUR, KETONESUR, PROTEINUR, UROBILINOGEN, NITRITE, LEUKOCYTESUR in the last 72 hours. ? ?Invalid input(s): APPERANCEUR  ? ? ?Imaging: ?CT HEAD WO CONTRAST (5MM) ? ?Result Date: 10/08/2021 ?CLINICAL DATA:  Mental status change, unknown cause; foudn down EXAM: CT HEAD WITHOUT CONTRAST CT CERVICAL SPINE WITHOUT CONTRAST TECHNIQUE: Multidetector CT imaging of the head and cervical spine was performed following the standard protocol without intravenous contrast. Multiplanar CT image reconstructions of the cervical spine were also generated. RADIATION DOSE REDUCTION: This exam was performed according to the departmental dose-optimization program which includes automated exposure control, adjustment of the mA and/or kV according to patient size and/or use of iterative reconstruction technique. COMPARISON:  None. FINDINGS: CT HEAD FINDINGS Brain: No evidence of large-territorial acute infarction. No parenchymal hemorrhage. No mass lesion. No extra-axial collection. No mass effect or midline shift. No hydrocephalus. Basilar cisterns are patent. Vascular: No hyperdense vessel. Atherosclerotic calcifications are present within the cavernous internal carotid and vertebral arteries. Skull: No acute fracture or focal lesion. Sinuses/Orbits: Most complete opacification of the left maxillary sinus. Paranasal sinuses and mastoid air cells are clear. The orbits are unremarkable. Other: Nonspecific right occipital scalp calcification (3:29). CT CERVICAL SPINE FINDINGS Alignment: Normal. Skull base and vertebrae: Multilevel mild degenerative changes of the spine. No acute fracture. No aggressive appearing focal osseous lesion or focal  pathologic process. Soft tissues and spinal canal: No prevertebral fluid or swelling. No visible canal hematoma. Upper chest: Unremarkable. Other: None. IMPRESSION: 1. No acute intracranial abnormality. 2. No acute displaced fracture or traumatic listhesis of the cervical spine. 3. Nonspecific right occipital scalp calcification. Correlate with physical exam. 4. Left maxillary sinus disease. Electronically Signed   By: Iven Finn M.D.   On: 10/08/2021 22:21  ? ?CT Cervical Spine Wo Contrast ? ?Result Date: 10/08/2021 ?CLINICAL DATA:  Mental status change, unknown cause; foudn down EXAM: CT HEAD WITHOUT CONTRAST CT CERVICAL SPINE WITHOUT CONTRAST TECHNIQUE: Multidetector CT imaging of the head and cervical spine was performed following the standard protocol without intravenous contrast. Multiplanar CT image reconstructions of the cervical spine were also generated. RADIATION DOSE REDUCTION: This exam was performed according to the departmental dose-optimization program which includes automated exposure control, adjustment of the mA and/or kV according to patient size and/or use of  iterative reconstruction technique. COMPARISON:  None. FINDINGS: CT HEAD FINDINGS Brain: No evidence of large-territorial acute infarction. No parenchymal hemorrhage. No mass lesion. No extra-axial collection. No mass effect or midline shift. No hydrocephalus. Basilar cisterns are patent. Vascular: No hyperdense vessel. Atherosclerotic calcifications are present within the cavernous internal carotid and vertebral arteries. Skull: No acute fracture or focal lesion. Sinuses/Orbits: Most complete opacification of the left maxillary sinus. Paranasal sinuses and mastoid air cells are clear. The orbits are unremarkable. Other: Nonspecific right occipital scalp calcification (3:29). CT CERVICAL SPINE FINDINGS Alignment: Normal. Skull base and vertebrae: Multilevel mild degenerative changes of the spine. No acute fracture. No aggressive  appearing focal osseous lesion or focal pathologic process. Soft tissues and spinal canal: No prevertebral fluid or swelling. No visible canal hematoma. Upper chest: Unremarkable. Other: None. IMPRESSION: 1. No a

## 2021-10-10 NOTE — Consult Note (Signed)
PHARMACY CONSULT NOTE - FOLLOW UP ? ?Pharmacy Consult for Electrolyte Monitoring and Replacement  ? ?Recent Labs: ?Potassium (mmol/L)  ?Date Value  ?10/10/2021 4.3  ? ?Magnesium (mg/dL)  ?Date Value  ?10/10/2021 2.3  ? ?Calcium (mg/dL)  ?Date Value  ?10/10/2021 8.9  ? ?Albumin (g/dL)  ?Date Value  ?10/09/2021 3.8  ? ?Phosphorus (mg/dL)  ?Date Value  ?10/10/2021 9.3 (H)  ? ?Sodium (mmol/L)  ?Date Value  ?10/10/2021 139  ? ? ?Assessment: ?36 year old patient admitted after being found unresponsive. 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 Presented after reportedly taking excess insulin in addition to low PO intake. Admitted with hypoglycemia. Pharmacy has been consulted to monitor and replace electrolytes.  ? ?Goal of Therapy:  ?Electrolytes WNL ? ?Plan:  ?No supplementation currently warranted ?Will recheck electrolytes with AM labs ? ? ?Wynelle Cleveland, PharmD ?Pharmacy Resident  ?10/10/2021 ?8:09 AM ? ?

## 2021-10-10 NOTE — Progress Notes (Signed)
Inpatient Diabetes Program Recommendations ? ?AACE/ADA: New Consensus Statement on Inpatient Glycemic Control (2015) ? ?Target Ranges:  Prepandial:   less than 140 mg/dL ?     Peak postprandial:   less than 180 mg/dL (1-2 hours) ?     Critically ill patients:  140 - 180 mg/dL  ? ?Lab Results  ?Component Value Date  ? GLUCAP 104 (H) 10/10/2021  ? HGBA1C 10.4 (H) 10/09/2021  ? ? ?Review of Glycemic Control ? Latest Reference Range & Units 10/09/21 18:21 10/09/21 19:18 10/09/21 23:18 10/10/21 03:11 10/10/21 07:34 10/10/21 11:50  ?Glucose-Capillary 70 - 99 mg/dL 240 (H) 260 (H) 221 (H) 104 (H) 113 (H) 104 (H)  ?(H): Data is abnormally high ? ?Diabetes history: DM 1 ?Outpatient Diabetes medications:  ?Lantus 17 units q AM, Novolog ?Current orders for Inpatient glycemic control:  ?Novolog 0-6 units q 4 hours, Semglee 13 units daily, Novolog 2 units tid meal coverage ? ?Inpatient Diabetes Program Recommendations:   ?Spoke with patient @ bedside and reviewed hypoglycemia protocol. Patient ordered food to be delivered and took insulin without waiting for food which was delayed so fell asleep and had hypoglycemia. ?Patient agrees to review glucose reading on his sensor and hold off giving insulin until food arrives in the future. Patient well aware of hypoglycemia protocol and feels confident to manage glucose levels in the future. ? ?Thank you, ?Nani Gasser Marie Chow, RN, MSN, CDE  ?Diabetes Coordinator ?Inpatient Glycemic Control Team ?Team Pager [phone] (8am-5pm) ?10/10/2021 2:03 PM ? ? ? ? ?

## 2021-10-11 ENCOUNTER — Encounter: Payer: Self-pay | Admitting: Pulmonary Disease

## 2021-10-11 DIAGNOSIS — E162 Hypoglycemia, unspecified: Secondary | ICD-10-CM | POA: Diagnosis not present

## 2021-10-11 LAB — BASIC METABOLIC PANEL
Anion gap: 23 — ABNORMAL HIGH (ref 5–15)
BUN: 97 mg/dL — ABNORMAL HIGH (ref 6–20)
CO2: 17 mmol/L — ABNORMAL LOW (ref 22–32)
Calcium: 8.3 mg/dL — ABNORMAL LOW (ref 8.9–10.3)
Chloride: 94 mmol/L — ABNORMAL LOW (ref 98–111)
Creatinine, Ser: 11.7 mg/dL — ABNORMAL HIGH (ref 0.61–1.24)
GFR, Estimated: 5 mL/min — ABNORMAL LOW (ref >60)
Glucose, Bld: 212 mg/dL — ABNORMAL HIGH (ref 70–99)
Potassium: 5.1 mmol/L (ref 3.5–5.1)
Sodium: 134 mmol/L — ABNORMAL LOW (ref 135–145)

## 2021-10-11 LAB — PHOSPHORUS: Phosphorus: 11 mg/dL — ABNORMAL HIGH (ref 2.5–4.6)

## 2021-10-11 LAB — GLUCOSE, CAPILLARY
Glucose-Capillary: 186 mg/dL — ABNORMAL HIGH (ref 70–99)
Glucose-Capillary: 79 mg/dL (ref 70–99)
Glucose-Capillary: 138 mg/dL — ABNORMAL HIGH (ref 70–99)

## 2021-10-11 LAB — CBC
HCT: 37.1 % — ABNORMAL LOW (ref 39.0–52.0)
Hemoglobin: 12.2 g/dL — ABNORMAL LOW (ref 13.0–17.0)
MCH: 29.8 pg (ref 26.0–34.0)
MCHC: 32.9 g/dL (ref 30.0–36.0)
MCV: 90.5 fL (ref 80.0–100.0)
Platelets: 205 10*3/uL (ref 150–400)
RBC: 4.1 MIL/uL — ABNORMAL LOW (ref 4.22–5.81)
RDW: 17.5 % — ABNORMAL HIGH (ref 11.5–15.5)
WBC: 4.5 10*3/uL (ref 4.0–10.5)
nRBC: 0 % (ref 0.0–0.2)

## 2021-10-11 LAB — HEPATITIS B SURFACE ANTIGEN: Hepatitis B Surface Ag: NONREACTIVE

## 2021-10-11 LAB — MAGNESIUM: Magnesium: 2.3 mg/dL (ref 1.7–2.4)

## 2021-10-11 LAB — HEPATITIS B SURFACE ANTIBODY,QUALITATIVE: Hep B S Ab: REACTIVE — AB

## 2021-10-11 MED ORDER — ALTEPLASE 2 MG IJ SOLR: 2.0000 mg | Freq: Once | INTRAMUSCULAR | Status: DC | PRN

## 2021-10-11 MED ORDER — HEPARIN SODIUM (PORCINE) 1000 UNIT/ML DIALYSIS: 1000.0000 [IU] | INTRAMUSCULAR | Status: DC | PRN

## 2021-10-11 MED ORDER — FERRIC CITRATE 1 GM 210 MG(FE) PO TABS
420.0000 mg | ORAL_TABLET | Freq: Three times a day (TID) | ORAL | Status: DC
  Filled 2021-10-11: qty 2

## 2021-10-11 MED ORDER — PENTAFLUOROPROP-TETRAFLUOROETH EX AERO: 1.0000 "application " | INHALATION_SPRAY | CUTANEOUS | Status: DC | PRN

## 2021-10-11 MED ORDER — LIDOCAINE-PRILOCAINE 2.5-2.5 % EX CREA
1.0000 "application " | TOPICAL_CREAM | CUTANEOUS | Status: DC | PRN
  Filled 2021-10-11: qty 5

## 2021-10-11 MED ORDER — LIDOCAINE HCL (PF) 1 % IJ SOLN
5.0000 mL | INTRAMUSCULAR | Status: DC | PRN
  Filled 2021-10-11: qty 5

## 2021-10-11 MED ORDER — SODIUM CHLORIDE 0.9 % IV SOLN: 100.0000 mL | INTRAVENOUS | Status: DC | PRN

## 2021-10-11 MED ORDER — METOPROLOL SUCCINATE ER 25 MG PO TB24
25.0000 mg | ORAL_TABLET | Freq: Every day | ORAL | 0 refills | Status: DC
Start: 2021-10-11 — End: 2021-11-13

## 2021-10-11 MED ORDER — FERRIC CITRATE 1 GM 210 MG(FE) PO TABS: 420.0000 mg | ORAL_TABLET | Freq: Three times a day (TID) | ORAL | 0 refills | Status: AC

## 2021-10-11 NOTE — Progress Notes (Signed)
?  Transition of Care (TOC) Screening Note ? ? ?Patient Details  ?Name: Raymond Lamb ?Date of Birth: 1985-10-01 ? ? ?Transition of Care (TOC) CM/SW Contact:    ?Shelbie Hutching, RN ?Phone Number: ?10/11/2021, 1:19 PM ? ? ? ?Transition of Care Department [facility]) has reviewed patient and no TOC needs have been identified at this time. We will continue to monitor patient advancement through interdisciplinary progression rounds. If new patient transition needs arise, please place a TOC consult. ?  ?

## 2021-10-11 NOTE — Progress Notes (Signed)
Inpatient Diabetes Program Recommendations ? ?AACE/ADA: New Consensus Statement on Inpatient Glycemic Control (2015) ? ?Target Ranges:  Prepandial:   less than 140 mg/dL ?     Peak postprandial:   less than 180 mg/dL (1-2 hours) ?     Critically ill patients:  140 - 180 mg/dL  ? ?Lab Results  ?Component Value Date  ? GLUCAP 186 (H) 10/11/2021  ? HGBA1C 10.4 (H) 10/09/2021  ? ? ?Review of Glycemic Control ? Latest Reference Range & Units 10/10/21 11:50 10/10/21 15:44 10/10/21 19:14 10/10/21 23:26 10/11/21 05:01  ?Glucose-Capillary 70 - 99 mg/dL 104 (H) 106 (H) 80 187 (H) 186 (H)  ?(H): Data is abnormally high ? ?Diabetes history: DM 1 ?Outpatient Diabetes medications:  ?Lantus 17 units q AM, Novolog ?Current orders for Inpatient glycemic control:  ?Novolog 1-3 units q 4 hours, Semglee 13 units daily, Novolog 2 units tid meal coverage ? ?Inpatient Diabetes Program Recommendations:   ?Since patient is eating: ?-Change Novolog correction to tid hs 0-5 ?Secure chat sent to Dr. Kurtis Bushman. ? ?Thank you, ?Nani Gasser Tashala Cumbo, RN, MSN, CDE  ?Diabetes Coordinator ?Inpatient Glycemic Control Team ?Team Pager [phone] (8am-5pm) ?10/11/2021 7:48 AM ? ? ? ?

## 2021-10-11 NOTE — Progress Notes (Signed)
[facility] ?[place], [place] ?10/11/21 ? ?Subjective:  ? ?LOS: 3 ? ?Patient was brought in to be emergency room by EMS after he was found unresponsive at home.  Patient is insulin dependent diabetic (type 1).  EMS found his blood sugars to be low which required dextrose boluses.  Patient was still minimally responsive therefore was brought to the emergency room for evaluation.  In the ER he was also found to be hypotensive and hypothermic and received fluid resuscitation.  He was given IV steroids, started on broad-spectrum antibiotics as well as Levophed drip. ? ?Patient seen and evaluated during dialysis ?  ?HEMODIALYSIS FLOWSHEET: ? ?Blood Flow Rate (mL/min): 400 mL/min ?Arterial Pressure (mmHg): -220 mmHg ?Venous Pressure (mmHg): 180 mmHg ?Transmembrane Pressure (mmHg): 50 mmHg ?Ultrafiltration Rate (mL/min): 670 mL/min ?Dialysate Flow Rate (mL/min): 500 ml/min ?Conductivity: Machine : 13.9 ?Conductivity: Machine : 13.9 ? ?Denies pain and discomfort ?No complaints at this time ? ?Objective:  ?Vital signs in last 24 hours:  ?Temp:  [97.9 ?F (36.6 ?C)-98.6 ?F (37 ?C)] 97.9 ?F (36.6 ?C) (04/18 0300) ?Pulse Rate:  [91-113] 112 (04/18 1145) ?Resp:  [11-31] 24 (04/18 1145) ?BP: (124-153)/(64-100) 141/91 (04/18 1145) ?SpO2:  [94 %-98 %] 95 % (04/18 1120) ?Weight:  [70.2 kg] 70.2 kg (04/18 0944) ? ?Weight change:  ?Filed Weights  ? 10/09/21 0244 10/10/21 0500 10/11/21 0944  ?Weight: 69.5 kg 69.5 kg 70.2 kg  ? ? ?Intake/Output: ?  ? ?Intake/Output Summary (Last 24 hours) at 10/11/2021 1204 ?Last data filed at 10/11/2021 0900 ?Gross per 24 hour  ?Intake 660 ml  ?Output --  ?Net 660 ml  ? ? ? ?Physical Exam: ?General:  No acute distress, sitting up in bed  ?HEENT  anicteric, moist oral mucous membrane  ?Pulm/lungs  normal breathing effort, lungs are clear to auscultation  ?CVS/Heart  regular rhythm, no rub or gallop  ?Abdomen:   Soft, nontender  ?Extremities:  No peripheral edema  ?Neurologic:  Alert,  oriented, able to follow commands  ?Skin:  No acute rashes  ?Left arm AVF, good bruit and thrill ? ?Basic Metabolic Panel: ? ?Recent Labs  ?Lab 10/08/21 ?2056 10/09/21 ?0216 10/10/21 ?0444 10/11/21 ?9233  ?NA 139 137 139 134*  ?K 3.8 4.1 4.3 5.1  ?CL 99 98 95* 94*  ?CO2 26 24 21* 17*  ?GLUCOSE 45* 136* 94 212*  ?BUN 33* 35* 67* 97*  ?CREATININE 6.30* 6.35* 9.71* 11.70*  ?CALCIUM 9.1 8.5* 8.9 8.3*  ?MG  --  2.3 2.3 2.3  ?PHOS  --  5.8* 9.3* 11.0*  ? ? ? ? ?CBC: ?Recent Labs  ?Lab 10/08/21 ?2056 10/09/21 ?0216 10/10/21 ?0444 10/11/21 ?1020  ?WBC 9.3 8.8 8.9 4.5  ?NEUTROABS 5.4  --   --   --   ?HGB 15.0 14.0 13.7 12.2*  ?HCT 48.3 45.1 42.5 37.1*  ?MCV 95.3 93.4 93.2 90.5  ?PLT 223 200 202 205  ? ? ? ?  ?Lab Results  ?Component Value Date  ? HEPBSAG NON REACTIVE 12/02/2020  ? ? ? ? ?Microbiology: ? ?Recent Results (from the past 240 hour(s))  ?Blood Culture (routine x 2)     Status: None (Preliminary result)  ? Collection Time: 10/08/21 10:23 PM  ? Specimen: BLOOD  ?Result Value Ref Range Status  ? Specimen Description BLOOD BLOOD RIGHT WRIST  Final  ? Special Requests   Final  ?  BOTTLES DRAWN AEROBIC AND ANAEROBIC Blood Culture adequate volume  ? Culture   Final  ?  NO GROWTH 3 DAYS ?Performed at [facility], [address] [place], [place] [address] ?  ? Report Status PENDING  Incomplete  ?Blood Culture (routine x 2)     Status: None (Preliminary result)  ? Collection Time: 10/08/21 11:05 PM  ? Specimen: BLOOD  ?Result Value Ref Range Status  ? Specimen Description BLOOD BLOOD RIGHT HAND  Final  ? Special Requests   Final  ?  BOTTLES DRAWN AEROBIC AND ANAEROBIC Blood Culture adequate volume  ? Culture   Final  ?  NO GROWTH 3 DAYS ?Performed at [facility], [address], [place], [place] [address] ?  ? Report Status PENDING  Incomplete  ?MRSA Next Gen by PCR, Nasal     Status: None  ? Collection Time: 10/09/21 12:44 AM  ? Specimen: Nasal Mucosa; Nasal Swab  ?Result Value Ref Range Status  ?  MRSA by PCR Next Gen NOT DETECTED NOT DETECTED Final  ?  Comment: (NOTE) ?The GeneXpert MRSA Assay (FDA approved for NASAL specimens only), ?is one component of a comprehensive MRSA colonization surveillance ?program. It is not intended to diagnose MRSA infection nor to guide ?or monitor treatment for MRSA infections. ?Test performance is not FDA approved in patients less than 2 years ?old. ?Performed at [facility], [place], ?[place] [address] ?  ? ? ?Coagulation Studies: ?Recent Labs  ?  10/08/21 ?2056  ?LABPROT 13.6  ?INR 1.1  ? ? ? ?Urinalysis: ?No results for input(s): COLORURINE, LABSPEC, [place], GLUCOSEU, HGBUR, BILIRUBINUR, KETONESUR, PROTEINUR, UROBILINOGEN, NITRITE, LEUKOCYTESUR in the last 72 hours. ? ?Invalid input(s): APPERANCEUR  ? ? ?Imaging: ?No results found. ? ? ?Medications:  ? ? sodium chloride Stopped (10/09/21 0231)  ? sodium chloride    ? sodium chloride    ? ? apixaban  5 mg Oral BID  ? atorvastatin  80 mg Oral QPM  ? Chlorhexidine Gluconate Cloth  6 each Topical Q0600  ? clopidogrel  75 mg Oral Daily  ? ezetimibe  10 mg Oral Daily  ? insulin aspart  1-3 Units Subcutaneous Q4H  ? insulin aspart  2 Units Subcutaneous TID WC  ? insulin glargine-yfgn  13 Units Subcutaneous Daily  ? pantoprazole  80 mg Oral Daily  ? ?sodium chloride, sodium chloride, alteplase, docusate sodium, heparin, lidocaine (PF), lidocaine-prilocaine, pentafluoroprop-tetrafluoroeth, polyethylene glycol ? ?Assessment/ Plan:  ?36 y.o. male with type I diabetic, end-stage renal disease, history of acute pulmonary embolism December 2022, atrial fibrillation, history of MI May 2022, history of stroke 2017, history of osteomyelitis, peripheral vascular disease, history of chronic left upper extremity DVT, history of hospitalizations for DKA was admitted on 10/08/2021 for ? ?Principal Problem: ?  Hypoglycemia ? ?Hypoglycemia [E16.2] ?SIRS (systemic inflammatory response syndrome) (HCC)  [R65.10] ?Hypothermia, initial encounter [T68.XXXA] ?Hypotension, unspecified hypotension type [I95.9] ?Altered mental status, unspecified altered mental status type [R41.82] ? ?[facility]/ TTS ? ?#. ESRD ?Receiving dialysis with UF goal 1L as tolerated. Next treatment scheduled for Thursday.  ? ?#. Anemia of CKD ? ?Lab Results  ?Component Value Date  ? HGB 12.2 (L) 10/11/2021  ?Hgb remains at target ? ? ?#. Secondary hyperparathyroidism of renal origin N 25.81  ? ?No results found for: PTH ?Lab Results  ?Component Value Date  ? PHOS 11.0 (H) 10/11/2021  ? ?Monitor calcium and phos level during this admission ?Calcium 8.3 today ? ?#. Diabetes type 1 with CKD ?Hgb A1c MFr Bld (%)  ?Date Value  ?10/09/2021 10.4 (H)  ?  Patient was admitted with hypoglycemia ?Insulin dose adjustment as per hospitalist team. ?Glucose stable at this time ? ? LOS: 3 ?[place] ?4/18/202312:04 PM ? ?[facility] ?[place], [place] ?[phone]  ?

## 2021-10-11 NOTE — Progress Notes (Signed)
Patient was asleep upon arrival and did not wake up to verbal stimuli. Follow up visit needed.  ? ?Rev. Evelena Asa, M.Div. ?Healthcare Chaplain ?

## 2021-10-11 NOTE — Consult Note (Signed)
PHARMACY CONSULT NOTE - FOLLOW UP ? ?Pharmacy Consult for Electrolyte Monitoring and Replacement  ? ?Recent Labs: ?Potassium (mmol/L)  ?Date Value  ?10/11/2021 5.1  ? ?Magnesium (mg/dL)  ?Date Value  ?10/11/2021 2.3  ? ?Calcium (mg/dL)  ?Date Value  ?10/11/2021 8.3 (L)  ? ?Albumin (g/dL)  ?Date Value  ?10/09/2021 3.8  ? ?Phosphorus (mg/dL)  ?Date Value  ?10/11/2021 11.0 (H)  ? ?Sodium (mmol/L)  ?Date Value  ?10/11/2021 134 (L)  ? ? ?Assessment: ?36 year old patient admitted after being found unresponsive. 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 Presented after reportedly taking excess insulin in addition to low PO intake. Admitted with hypoglycemia. Pharmacy has been consulted to monitor and replace electrolytes.  ? ?Goal of Therapy:  ?Electrolytes WNL ? ?Plan:  ?No supplementation currently warranted ?Noted elevated phosphorus. Patient to be discharged on PTA [place] ?Pharmacy will sign off managing electrolytes, as patient is to be discharged 4/18 ? ? ?Wynelle Cleveland, PharmD ?Pharmacy Resident  ?10/11/2021 ?7:46 AM ? ?

## 2021-10-11 NOTE — Progress Notes (Signed)
Pt returned from dialysis via bed and in NAD. Alert and oreinted. Pt provided cheeseburger as requested and ate 100% meal. Also given meal coverage of insulin. Pt transported to DC by NT via WC to meet [place]. Pt verbalized understanding of all instructions, in particular, he should not take long acting insulin at hs tonight, as it was given this am.  ?

## 2021-10-11 NOTE — Discharge Summary (Signed)
Raymond Lamb KWI:[id number] DOB: 12-Jun-1986 DOA: 10/08/2021 ? ?PCP: Norina Buzzard, MD ? ?Admit date: 10/08/2021 ?Discharge date: 10/11/2021 ? ?Admitted From: Home ?Disposition: Home ? ?Recommendations for Outpatient Follow-up:  ?Follow up with PCP in 1 week ?Please obtain BMP/CBC in one week ?Please follow up endocrinology as outpatient ? ? ? ? ?Discharge Condition:Stable ?CODE STATUS: Full ?Diet recommendation: Renal diet/carb modified  ? ? ?Brief/Interim Summary: ?Per HPI:36 yo M presenting to [facility] ED on 10/08/21 from home via EMS after being found unresponsive at the home he shares with his mother by his cousin. History provided by his mother, Derek Jack, via telephone. She reports the patient was in his normal state of health. He got back from an ?iHD session, tired which is usual for him and was getting ready to eat when she left. She reports that often after HD patient is lethargic and sleeps so soundly he does not check his sugar. Mom concerned that maybe he didn't eat enough. She confirmed that he uses SQ insulin, but wears a monitor and provided his CBG readings from the afternoon of 4/15: 15:30- 278, 16:13-384, 17:30-93, 18:40-101, 19:30-45, 19:55-low. ?Her nephew went to check on the patient but couldn't get a response at the front door and climbed in through the window, finding him unresponsive, calling EMS. Per ED documentation, EMS gave the patient 2 D 10 boluses which improved his blood glucose to 100 but the patient was still minimally responsive.  ?ED course: ?Per ED documentation, on arrival the patient was only able to verbally respond with his name. Patient remained severely hypoglycemic at 22 and received an amp of D 50. After this administration he became more responsive able to answer some questions, but subsequently more confused. He was given ativan & haldol in order to safely place an IV & obtain CT imaging. Blood glucose had stabilized, then dropped again from 106 to the 30's and the  patient was placed on a D 10 infusion. Patient also hypotensive at 82/54 and hypothermic at 92.2, receiving 1 L of LR and bear hugger placement.  ?Medications given: D50, solu cortef, 1 L LR, ativan 2 mg, haldol 5 mg, cefepime & vancomycin, D10 infusion & levophed drip initiated. ?Patient was admitted to the ICU as PCCM was consulted.  Weaned off of pressors.  Sugars remain stabilized.  Awake.  He had dialysis today.  He is stable for discharge. ?CT head wo contrast 10/08/21: no acute intracranial abnormality.  ?CT cervical spine wo contrast 10/08/21: No acute displaced fracture or traumatic listhesis of the cervical spine. Non specifc right occipital scalp calcification, left maxillary sinus disease. ? ? ? ?Shock secondary to unknown etiology in the setting of sedating medications vs hypovolemia from iHD vs suspected sepsis? ?PMHx: HTN ?Low suspicion for sepsis or occult infection, Lactic: 1.2, no leukocytosis. CXR demonstrates very mild hazy opacity in RUL/RML. Patient received 2 mg of Ativan & 5 mg Haldol because of agitation. Pt received 1 L of LR & cefepime/vancomycin ?- continue peripheral levophed, wean a tolerated to maintain MAP > 65 ?- conservative IVF in the setting of ESRD ?- trend lactic, f/u cultures, PCT not useful d/t ESRD ?- daily CBC, trend WBC/fever curve > will hold on additional ABX at this time, start as needed ?4/17 sepsis ruled out.  Hemodynamically stable.  Cultures pending ?  ?Severe Hypoglycemia  ?PMHx: T1DM ?Mom suspects the patient did not eat enough with insulin. Patient received multiple doses of Dextrose. On arrival to ICU CBG 65 on D10 @  75 mL/h, additional 1/2 amp D50 ordered. ?BG then improved ?A1c 10.4 ?Was told to make sure he eats when he is taking his insulin ?Recent follow-up with his endocrinologist ?  ?Acute Toxic Metabolic Encephalopathy in the setting of severe hypoglycemia and sedating medications ?PMHx: CVA (with residual LEFT hemiparesis) ?Improved, at baseline ? ?   ?Pulmonary Embolism- history of on Eliquis ?PMHx: PVD, CVA, HTN, HLD, CAD ?On Eliquis ?  ?ESRD (TThSa) ?Had HD today ?Nephrology was consulted ?  ?Mild Hyperammoniemia - 38, mildly elevated ?Other LFT's WNL. Pt does not drink ETOH regularly per mom ?Improved ? ? ?Discharge Diagnoses:  ?Principal Problem: ?  Hypoglycemia ? ? ? ?Discharge Instructions ? ?Discharge Instructions   ? ? Diet - low sodium heart healthy   Complete by: As directed ?  ? Diet - low sodium heart healthy   Complete by: As directed ?  ? Diet Carb Modified   Complete by: As directed ?  ? Diet Carb Modified   Complete by: As directed ?  ? Discharge instructions   Complete by: As directed ?  ? Resume home blood pressure meds as you previously took ?Make sure you eat food when taking your insulin  ? Discharge instructions   Complete by: As directed ?  ? Follow up with your PCP about your blood pressure medications as they were changes made ?Make sure you eat when you take your insulin ?Follow-up with your endocrinologist  ? Increase activity slowly   Complete by: As directed ?  ? Increase activity slowly   Complete by: As directed ?  ? No wound care   Complete by: As directed ?  ? No wound care   Complete by: As directed ?  ? ?  ? ?Allergies as of 10/11/2021   ? ?   Reactions  ? Baclofen Itching, Other (See Comments)  ? Became disoriented, emesis  ? Iodinated Contrast Media Hives  ? Lisinopril Swelling  ? Other Swelling  ? Oxybenzone   ? Other reaction(s): Unknown  ? ?  ? ?  ?Medication List  ?  ? ?STOP taking these medications   ? ?amLODipine 10 MG tablet ?Commonly known as: NORVASC ?  ?Baqsimi One Pack 3 MG/DOSE Powd ?Generic drug: Glucagon ?  ?carvedilol 25 MG tablet ?Commonly known as: COREG ?  ?hydrALAZINE 50 MG tablet ?Commonly known as: APRESOLINE ?  ?hydrOXYzine 10 MG tablet ?Commonly known as: ATARAX ?  ?isosorbide mononitrate 30 MG 24 hr tablet ?Commonly known as: IMDUR ?  ?tiZANidine 2 MG tablet ?Commonly known as: ZANAFLEX ?  ? ?  ? ?TAKE  these medications   ? ?apixaban 5 MG Tabs tablet ?Commonly known as: ELIQUIS ?Take 5 mg by mouth 2 (two) times daily. ?  ?aspirin 81 MG chewable tablet ?Chew 81 mg by mouth daily. ?  ?atorvastatin 80 MG tablet ?Commonly known as: LIPITOR ?Take 80 mg by mouth every evening. ?  ?cinacalcet 60 MG tablet ?Commonly known as: SENSIPAR ?Take 120 mg by mouth daily. ?  ?clopidogrel 75 MG tablet ?Commonly known as: PLAVIX ?Take 75 mg by mouth daily. ?  ?ergocalciferol 1.25 MG (50000 UT) capsule ?Commonly known as: VITAMIN D2 ?Take 50,000 Units by mouth once a week. ?  ?ezetimibe 10 MG tablet ?Commonly known as: ZETIA ?Take 10 mg by mouth daily. ?  ?ferric citrate 1 GM 210 MG(Fe) tablet ?Commonly known as: AURYXIA ?Take 2 tablets (420 mg total) by mouth 3 (three) times daily with meals. ?What changed: when to  take this ?  ?gabapentin 300 MG capsule ?Commonly known as: NEURONTIN ?Take 600 mg by mouth at bedtime. ?  ?Lantus SoloStar 100 UNIT/ML Solostar Pen ?Generic drug: insulin glargine ?Inject 13 Units into the skin at bedtime. ?  ?metoprolol succinate 25 MG 24 hr tablet ?Commonly known as: TOPROL-XL ?Take 1 tablet (25 mg total) by mouth daily. ?What changed:  ?medication strength ?how much to take ?  ?nitroGLYCERIN 0.4 MG SL tablet ?Commonly known as: NITROSTAT ?Place 1 tablet under the tongue daily as needed. ?  ?NovoLOG FlexPen 100 UNIT/ML FlexPen ?Generic drug: insulin aspart ?Inject 5 Units into the skin 3 (three) times daily. Use per sliding scale no more then 50 units a day ?What changed:  ?how much to take ?additional instructions ?  ?omeprazole 40 MG capsule ?Commonly known as: PRILOSEC ?Take 40 mg by mouth daily with breakfast. ?  ?sertraline 100 MG tablet ?Commonly known as: ZOLOFT ?Take 150 mg by mouth in the morning. ?  ? ?  ? ? Follow-up Information   ? ? Norina Buzzard, MD Follow up in 1 week(s).   ?Specialty: Family Medicine ?Contact information: ?[address] ?[address] ?[place]  [place] [address] ?[phone] ? ? ?  ?  ? ?  ?  ? ?  ? ?Allergies  ?Allergen Reactions  ? Baclofen Itching and Other (See Comments)  ?  Became disoriented, emesis  ? Iodinated Contrast Media Hives  ? Lisinopril Swelli

## 2021-10-11 NOTE — Progress Notes (Signed)
To hemodialysis via bed with transporter ?

## 2021-10-11 NOTE — Progress Notes (Signed)
[facility] ?[place], [place] ?10/11/21 ? ?Subjective:  ? ?LOS: 3 ? ?Patient was brought in to be emergency room by EMS after he was found unresponsive at home.  Patient is insulin dependent diabetic (type 1).  EMS found his blood sugars to be low which required dextrose boluses.  Patient was still minimally responsive therefore was brought to the emergency room for evaluation.  In the ER he was also found to be hypotensive and hypothermic and received fluid resuscitation.  He was given IV steroids, started on broad-spectrum antibiotics as well as Levophed drip. ? ?Today, he feels well and states that he is able to eat without any nausea or vomiting.  No shortness of breath.  No leg edema.  He is anticipating discharge today. ? ?Objective:  ?Vital signs in last 24 hours:  ?Temp:  [98.6 ?F (37 ?C)] 98.6 ?F (37 ?C) (04/18 0400) ?Resp:  [11-29] 11 (04/18 0900) ?BP: (108-153)/(64-100) 153/100 (04/18 0900) ? ?Weight change:  ?Filed Weights  ? 10/08/21 2100 10/09/21 0244 10/10/21 0500  ?Weight: 69.5 kg 69.5 kg 69.5 kg  ? ? ?Intake/Output: ?  ? ?Intake/Output Summary (Last 24 hours) at 10/11/2021 0929 ?Last data filed at 10/10/2021 1300 ?Gross per 24 hour  ?Intake 720 ml  ?Output --  ?Net 720 ml  ? ? ? ?Physical Exam: ?General:  No acute distress, laying in the bed  ?HEENT  anicteric, moist oral mucous membrane  ?Pulm/lungs  normal breathing effort, lungs are clear to auscultation  ?CVS/Heart  regular rhythm, no rub or gallop  ?Abdomen:   Soft, nontender  ?Extremities:  No peripheral edema  ?Neurologic:  Alert, oriented, able to follow commands  ?Skin:  No acute rashes  ?Left arm AVF, good bruit and thrill ? ?Basic Metabolic Panel: ? ?Recent Labs  ?Lab 10/08/21 ?2056 10/09/21 ?0216 10/10/21 ?0444 10/11/21 ?7124  ?NA 139 137 139 134*  ?K 3.8 4.1 4.3 5.1  ?CL 99 98 95* 94*  ?CO2 26 24 21* 17*  ?GLUCOSE 45* 136* 94 212*  ?BUN 33* 35* 67* 97*  ?CREATININE 6.30* 6.35* 9.71* 11.70*  ?CALCIUM 9.1 8.5* 8.9 8.3*   ?MG  --  2.3 2.3 2.3  ?PHOS  --  5.8* 9.3* 11.0*  ? ? ? ? ?CBC: ?Recent Labs  ?Lab 10/08/21 ?2056 10/09/21 ?0216 10/10/21 ?0444  ?WBC 9.3 8.8 8.9  ?NEUTROABS 5.4  --   --   ?HGB 15.0 14.0 13.7  ?HCT 48.3 45.1 42.5  ?MCV 95.3 93.4 93.2  ?PLT 223 200 202  ? ? ? ?  ?Lab Results  ?Component Value Date  ? HEPBSAG NON REACTIVE 12/02/2020  ? ? ? ? ?Microbiology: ? ?Recent Results (from the past 240 hour(s))  ?Blood Culture (routine x 2)     Status: None (Preliminary result)  ? Collection Time: 10/08/21 10:23 PM  ? Specimen: BLOOD  ?Result Value Ref Range Status  ? Specimen Description BLOOD BLOOD RIGHT WRIST  Final  ? Special Requests   Final  ?  BOTTLES DRAWN AEROBIC AND ANAEROBIC Blood Culture adequate volume  ? Culture   Final  ?  NO GROWTH 3 DAYS ?Performed at [facility], [address] [place], [place] [address] ?  ? Report Status PENDING  Incomplete  ?Blood Culture (routine x 2)     Status: None (Preliminary result)  ? Collection Time: 10/08/21 11:05 PM  ? Specimen: BLOOD  ?Result Value Ref Range Status  ? Specimen Description BLOOD BLOOD RIGHT HAND  Final  ?  Special Requests   Final  ?  BOTTLES DRAWN AEROBIC AND ANAEROBIC Blood Culture adequate volume  ? Culture   Final  ?  NO GROWTH 3 DAYS ?Performed at [facility], [address], [place], [place] [address] ?  ? Report Status PENDING  Incomplete  ?MRSA Next Gen by PCR, Nasal     Status: None  ? Collection Time: 10/09/21 12:44 AM  ? Specimen: Nasal Mucosa; Nasal Swab  ?Result Value Ref Range Status  ? MRSA by PCR Next Gen NOT DETECTED NOT DETECTED Final  ?  Comment: (NOTE) ?The GeneXpert MRSA Assay (FDA approved for NASAL specimens only), ?is one component of a comprehensive MRSA colonization surveillance ?program. It is not intended to diagnose MRSA infection nor to guide ?or monitor treatment for MRSA infections. ?Test performance is not FDA approved in patients less than 2 years ?old. ?Performed at [facility], [place], ?[place] [address] ?  ? ? ?Coagulation Studies: ?Recent Labs  ?  10/08/21 ?2056  ?LABPROT 13.6  ?INR 1.1  ? ? ? ?Urinalysis: ?No results for input(s): COLORURINE, LABSPEC, [place], GLUCOSEU, HGBUR, BILIRUBINUR, KETONESUR, PROTEINUR, UROBILINOGEN, NITRITE, LEUKOCYTESUR in the last 72 hours. ? ?Invalid input(s): APPERANCEUR  ? ? ?Imaging: ?No results found. ? ? ?Medications:  ? ? sodium chloride Stopped (10/09/21 0231)  ? ? apixaban  5 mg Oral BID  ? atorvastatin  80 mg Oral QPM  ? Chlorhexidine Gluconate Cloth  6 each Topical Q0600  ? clopidogrel  75 mg Oral Daily  ? ezetimibe  10 mg Oral Daily  ? insulin aspart  1-3 Units Subcutaneous Q4H  ? insulin aspart  2 Units Subcutaneous TID WC  ? insulin glargine-yfgn  13 Units Subcutaneous Daily  ? pantoprazole  80 mg Oral Daily  ? ?docusate sodium, polyethylene glycol ? ?Assessment/ Plan:  ?36 y.o. male with type I diabetic, end-stage renal disease, history of acute pulmonary embolism December 2022, atrial fibrillation, history of MI May 2022, history of stroke 2017, history of osteomyelitis, peripheral vascular disease, history of chronic left upper extremity DVT, history of hospitalizations for DKA was admitted on 10/08/2021 for ? ?Principal Problem: ?  Hypoglycemia ? ?Hypoglycemia [E16.2] ?SIRS (systemic inflammatory response syndrome) (HCC) [R65.10] ?Hypothermia, initial encounter [T68.XXXA] ?Hypotension, unspecified hypotension type [I95.9] ?Altered mental status, unspecified altered mental status type [R41.82] ? ?[facility]/ TTS ? ?#. ESRD ?Patient had routine hemodialysis on Saturday.   ?Seen during treatment  ?Tolerating well ?  ?HEMODIALYSIS FLOWSHEET: ? ?Blood Flow Rate (mL/min): 400 mL/min ?Arterial Pressure (mmHg): -220 mmHg ?Venous Pressure (mmHg): 200 mmHg ?Transmembrane Pressure (mmHg): 50 mmHg ?Ultrafiltration Rate (mL/min): 60 mL/min ?Dialysate Flow Rate (mL/min): 500 ml/min ?Conductivity: Machine : 13.9 ?Conductivity:  Machine : 13.9  ? ?#. Anemia of CKD ? ?Lab Results  ?Component Value Date  ? HGB 13.7 10/10/2021  ?No indication for Epogen at present.  Continue to monitor. ? ? ?#. Secondary hyperparathyroidism of renal origin N 25.81  ? ?No results found for: PTH ?Lab Results  ?Component Value Date  ? PHOS 11.0 (H) 10/11/2021  ? ?Monitor calcium and phos level during this admission ?Restart home dose of binders ? ? ?#. Diabetes type 1 with CKD ?Hgb A1c MFr Bld (%)  ?Date Value  ?10/09/2021 10.4 (H)  ?Patient was admitted with hypoglycemia ?Insulin dose adjustment as per hospitalist team. ? ? LOS: 3 ?Lesleigh Hughson ?4/18/20239:29 AM ? ?[facility] ?[place], [place] ?[phone]  ?

## 2021-10-12 LAB — HEPATITIS B SURFACE ANTIBODY, QUANTITATIVE: Hep B S AB Quant (Post): 88.7 m[IU]/mL (ref >9.9)

## 2021-10-13 LAB — CULTURE, BLOOD (ROUTINE X 2)
Culture: NO GROWTH
Culture: NO GROWTH
Special Requests: ADEQUATE
Special Requests: ADEQUATE

## 2021-11-13 ENCOUNTER — Inpatient Hospital Stay
Admission: EM | Admit: 2021-11-13 | Discharge: 2021-11-16 | DRG: 637 | Disposition: A | Discharge status: Home Health | Payer: Medicare (Managed Care) | Attending: Hospitalist | Admitting: Hospitalist

## 2021-11-13 ENCOUNTER — Other Ambulatory Visit: Payer: Self-pay

## 2021-11-13 ENCOUNTER — Emergency Department: Payer: Medicare (Managed Care)

## 2021-11-13 ENCOUNTER — Encounter: Payer: Self-pay | Admitting: Family Medicine

## 2021-11-13 DIAGNOSIS — E10649 Type 1 diabetes mellitus with hypoglycemia without coma: Secondary | ICD-10-CM

## 2021-11-13 DIAGNOSIS — E1022 Type 1 diabetes mellitus with diabetic chronic kidney disease: Secondary | ICD-10-CM | POA: Diagnosis present

## 2021-11-13 DIAGNOSIS — I120 Hypertensive chronic kidney disease with stage 5 chronic kidney disease or end stage renal disease: Secondary | ICD-10-CM | POA: Diagnosis present

## 2021-11-13 DIAGNOSIS — I2510 Atherosclerotic heart disease of native coronary artery without angina pectoris: Secondary | ICD-10-CM | POA: Diagnosis present

## 2021-11-13 DIAGNOSIS — E1010 Type 1 diabetes mellitus with ketoacidosis without coma: Principal | ICD-10-CM

## 2021-11-13 DIAGNOSIS — Z992 Dependence on renal dialysis: Secondary | ICD-10-CM

## 2021-11-13 DIAGNOSIS — Z91158 Patient's noncompliance with renal dialysis for other reason: Secondary | ICD-10-CM | POA: Diagnosis not present

## 2021-11-13 DIAGNOSIS — E1051 Type 1 diabetes mellitus with diabetic peripheral angiopathy without gangrene: Secondary | ICD-10-CM | POA: Diagnosis present

## 2021-11-13 DIAGNOSIS — Z794 Long term (current) use of insulin: Secondary | ICD-10-CM | POA: Diagnosis not present

## 2021-11-13 DIAGNOSIS — Z7902 Long term (current) use of antithrombotics/antiplatelets: Secondary | ICD-10-CM | POA: Diagnosis not present

## 2021-11-13 DIAGNOSIS — E875 Hyperkalemia: Secondary | ICD-10-CM | POA: Diagnosis present

## 2021-11-13 DIAGNOSIS — E876 Hypokalemia: Secondary | ICD-10-CM | POA: Diagnosis not present

## 2021-11-13 DIAGNOSIS — E871 Hypo-osmolality and hyponatremia: Secondary | ICD-10-CM

## 2021-11-13 DIAGNOSIS — F17210 Nicotine dependence, cigarettes, uncomplicated: Secondary | ICD-10-CM | POA: Diagnosis present

## 2021-11-13 DIAGNOSIS — E8729 Other acidosis: Secondary | ICD-10-CM | POA: Diagnosis not present

## 2021-11-13 DIAGNOSIS — I10 Essential (primary) hypertension: Secondary | ICD-10-CM | POA: Diagnosis present

## 2021-11-13 DIAGNOSIS — D631 Anemia in chronic kidney disease: Secondary | ICD-10-CM | POA: Diagnosis present

## 2021-11-13 DIAGNOSIS — Z86711 Personal history of pulmonary embolism: Secondary | ICD-10-CM

## 2021-11-13 DIAGNOSIS — Z7901 Long term (current) use of anticoagulants: Secondary | ICD-10-CM

## 2021-11-13 DIAGNOSIS — Z79899 Other long term (current) drug therapy: Secondary | ICD-10-CM | POA: Diagnosis not present

## 2021-11-13 DIAGNOSIS — Z8616 Personal history of COVID-19: Secondary | ICD-10-CM | POA: Diagnosis not present

## 2021-11-13 DIAGNOSIS — Z833 Family history of diabetes mellitus: Secondary | ICD-10-CM | POA: Diagnosis not present

## 2021-11-13 DIAGNOSIS — K219 Gastro-esophageal reflux disease without esophagitis: Secondary | ICD-10-CM | POA: Diagnosis present

## 2021-11-13 DIAGNOSIS — E782 Mixed hyperlipidemia: Secondary | ICD-10-CM | POA: Diagnosis present

## 2021-11-13 DIAGNOSIS — N186 End stage renal disease: Secondary | ICD-10-CM | POA: Diagnosis present

## 2021-11-13 DIAGNOSIS — Z7982 Long term (current) use of aspirin: Secondary | ICD-10-CM | POA: Diagnosis not present

## 2021-11-13 DIAGNOSIS — I739 Peripheral vascular disease, unspecified: Secondary | ICD-10-CM | POA: Diagnosis present

## 2021-11-13 DIAGNOSIS — F418 Other specified anxiety disorders: Secondary | ICD-10-CM | POA: Diagnosis present

## 2021-11-13 DIAGNOSIS — E1065 Type 1 diabetes mellitus with hyperglycemia: Secondary | ICD-10-CM | POA: Diagnosis not present

## 2021-11-13 DIAGNOSIS — N2581 Secondary hyperparathyroidism of renal origin: Secondary | ICD-10-CM | POA: Diagnosis present

## 2021-11-13 DIAGNOSIS — Z8673 Personal history of transient ischemic attack (TIA), and cerebral infarction without residual deficits: Secondary | ICD-10-CM

## 2021-11-13 LAB — LIPASE, BLOOD: Lipase: 122 U/L — ABNORMAL HIGH (ref 11–51)

## 2021-11-13 LAB — CBC WITH DIFFERENTIAL/PLATELET
Abs Immature Granulocytes: 0.04 10*3/uL (ref 0.00–0.07)
Basophils Absolute: 0.1 10*3/uL (ref 0.0–0.1)
Basophils Relative: 1 %
Eosinophils Absolute: 0 10*3/uL (ref 0.0–0.5)
Eosinophils Relative: 0 %
HCT: 39.1 % (ref 39.0–52.0)
Hemoglobin: 11.3 g/dL — ABNORMAL LOW (ref 13.0–17.0)
Immature Granulocytes: 0 %
Lymphocytes Relative: 4 %
Lymphs Abs: 0.4 10*3/uL — ABNORMAL LOW (ref 0.7–4.0)
MCH: 30.1 pg (ref 26.0–34.0)
MCHC: 28.9 g/dL — ABNORMAL LOW (ref 30.0–36.0)
MCV: 104.3 fL — ABNORMAL HIGH (ref 80.0–100.0)
Monocytes Absolute: 0.7 10*3/uL (ref 0.1–1.0)
Monocytes Relative: 7 %
Neutro Abs: 9.1 10*3/uL — ABNORMAL HIGH (ref 1.7–7.7)
Neutrophils Relative %: 88 %
Platelets: 180 10*3/uL (ref 150–400)
RBC: 3.75 MIL/uL — ABNORMAL LOW (ref 4.22–5.81)
RDW: 16 % — ABNORMAL HIGH (ref 11.5–15.5)
WBC: 10.3 10*3/uL (ref 4.0–10.5)
nRBC: 0 % (ref 0.0–0.2)

## 2021-11-13 LAB — RENAL FUNCTION PANEL
Albumin: 3.8 g/dL (ref 3.5–5.0)
BUN: 65 mg/dL — ABNORMAL HIGH (ref 6–20)
CO2: <7 mmol/L — ABNORMAL LOW (ref 22–32)
Calcium: 7.6 mg/dL — ABNORMAL LOW (ref 8.9–10.3)
Chloride: 77 mmol/L — ABNORMAL LOW (ref 98–111)
Creatinine, Ser: 14.73 mg/dL — ABNORMAL HIGH (ref 0.61–1.24)
GFR, Estimated: 4 mL/min — ABNORMAL LOW (ref >60)
Glucose, Bld: >1200 mg/dL (ref 70–99)
Phosphorus: 10.9 mg/dL — ABNORMAL HIGH (ref 2.5–4.6)
Potassium: 4.6 mmol/L (ref 3.5–5.1)
Sodium: 123 mmol/L — ABNORMAL LOW (ref 135–145)

## 2021-11-13 LAB — CBC
HCT: 35.9 % — ABNORMAL LOW (ref 39.0–52.0)
HCT: 35.9 % — ABNORMAL LOW (ref 39.0–52.0)
Hemoglobin: 10.7 g/dL — ABNORMAL LOW (ref 13.0–17.0)
Hemoglobin: 11 g/dL — ABNORMAL LOW (ref 13.0–17.0)
MCH: 30 pg (ref 26.0–34.0)
MCH: 30.5 pg (ref 26.0–34.0)
MCHC: 29.8 g/dL — ABNORMAL LOW (ref 30.0–36.0)
MCHC: 30.6 g/dL (ref 30.0–36.0)
MCV: 100.6 fL — ABNORMAL HIGH (ref 80.0–100.0)
MCV: 99.4 fL (ref 80.0–100.0)
Platelets: 181 10*3/uL (ref 150–400)
Platelets: 191 K/uL (ref 150–400)
RBC: 3.57 MIL/uL — ABNORMAL LOW (ref 4.22–5.81)
RBC: 3.61 MIL/uL — ABNORMAL LOW (ref 4.22–5.81)
RDW: 15.7 % — ABNORMAL HIGH (ref 11.5–15.5)
RDW: 15.9 % — ABNORMAL HIGH (ref 11.5–15.5)
WBC: 11.3 K/uL — ABNORMAL HIGH (ref 4.0–10.5)
WBC: 11.8 10*3/uL — ABNORMAL HIGH (ref 4.0–10.5)
nRBC: 0 % (ref 0.0–0.2)
nRBC: 0 % (ref 0.0–0.2)

## 2021-11-13 LAB — COMPREHENSIVE METABOLIC PANEL
ALT: 15 U/L (ref 0–44)
AST: 19 U/L (ref 15–41)
Albumin: 3.8 g/dL (ref 3.5–5.0)
Alkaline Phosphatase: 81 U/L (ref 38–126)
Anion gap: 36 — ABNORMAL HIGH (ref 5–15)
BUN: 65 mg/dL — ABNORMAL HIGH (ref 6–20)
CO2: 9 mmol/L — ABNORMAL LOW (ref 22–32)
Calcium: 7.8 mg/dL — ABNORMAL LOW (ref 8.9–10.3)
Chloride: 73 mmol/L — ABNORMAL LOW (ref 98–111)
Creatinine, Ser: 14.77 mg/dL — ABNORMAL HIGH (ref 0.61–1.24)
GFR, Estimated: 4 mL/min — ABNORMAL LOW (ref >60)
Glucose, Bld: >1200 mg/dL (ref 70–99)
Potassium: 7.3 mmol/L (ref 3.5–5.1)
Sodium: 118 mmol/L — CL (ref 135–145)
Total Bilirubin: 2.3 mg/dL — ABNORMAL HIGH (ref 0.3–1.2)
Total Protein: 7.5 g/dL (ref 6.5–8.1)

## 2021-11-13 LAB — BLOOD GAS, VENOUS
Acid-base deficit: 19.6 mmol/L — ABNORMAL HIGH (ref 0.0–2.0)
Bicarbonate: 8.7 mmol/L — ABNORMAL LOW (ref 20.0–28.0)
O2 Saturation: 47.3 %
Patient temperature: 37
pCO2, Ven: 28 mmHg — ABNORMAL LOW (ref 44–60)
pH, Ven: 7.1 — CL (ref 7.25–7.43)
pO2, Ven: 36 mmHg (ref 32–45)

## 2021-11-13 LAB — BASIC METABOLIC PANEL WITH GFR
BUN: 66 mg/dL — ABNORMAL HIGH (ref 6–20)
CO2: <7 mmol/L — ABNORMAL LOW (ref 22–32)
Calcium: 7.5 mg/dL — ABNORMAL LOW (ref 8.9–10.3)
Chloride: 76 mmol/L — ABNORMAL LOW (ref 98–111)
Creatinine, Ser: 14.94 mg/dL — ABNORMAL HIGH (ref 0.61–1.24)
GFR, Estimated: 4 mL/min — ABNORMAL LOW (ref >60)
Glucose, Bld: >1200 mg/dL (ref 70–99)
Potassium: 4.5 mmol/L (ref 3.5–5.1)
Sodium: 122 mmol/L — ABNORMAL LOW (ref 135–145)

## 2021-11-13 LAB — MRSA NEXT GEN BY PCR, NASAL: MRSA by PCR Next Gen: NOT DETECTED

## 2021-11-13 LAB — GLUCOSE, CAPILLARY
Glucose-Capillary: >600 mg/dL (ref 70–99)
Glucose-Capillary: >600 mg/dL (ref 70–99)
Glucose-Capillary: 388 mg/dL — ABNORMAL HIGH (ref 70–99)
Glucose-Capillary: 589 mg/dL (ref 70–99)

## 2021-11-13 LAB — CBG MONITORING, ED
Glucose-Capillary: >600 mg/dL (ref 70–99)
Glucose-Capillary: >600 mg/dL (ref 70–99)

## 2021-11-13 LAB — POTASSIUM: Potassium: 3 mmol/L — ABNORMAL LOW (ref 3.5–5.1)

## 2021-11-13 LAB — BETA-HYDROXYBUTYRIC ACID: Beta-Hydroxybutyric Acid: >8 mmol/L — ABNORMAL HIGH (ref 0.05–0.27)

## 2021-11-13 MED ORDER — LACTATED RINGERS IV BOLUS: 20.0000 mL/kg | Freq: Once | INTRAVENOUS | Status: DC

## 2021-11-13 MED ORDER — CALCIUM GLUCONATE 10 % IV SOLN
1.0000 g | Freq: Once | INTRAVENOUS | Status: AC
  Administered 2021-11-13: 1 g via INTRAVENOUS
  Filled 2021-11-13: qty 10

## 2021-11-13 MED ORDER — SODIUM CHLORIDE 0.9 % IV BOLUS: 1000.0000 mL | INTRAVENOUS | Status: DC

## 2021-11-13 MED ORDER — INSULIN REGULAR(HUMAN) IN NACL 100-0.9 UT/100ML-% IV SOLN: INTRAVENOUS | Status: DC

## 2021-11-13 MED ORDER — PENTAFLUOROPROP-TETRAFLUOROETH EX AERO: 1.0000 "application " | INHALATION_SPRAY | CUTANEOUS | Status: DC | PRN

## 2021-11-13 MED ORDER — LACTATED RINGERS IV SOLN: INTRAVENOUS | Status: DC

## 2021-11-13 MED ORDER — DEXTROSE IN LACTATED RINGERS 5 % IV SOLN: INTRAVENOUS | Status: DC

## 2021-11-13 MED ORDER — ACETAMINOPHEN 650 MG RE SUPP: 650.0000 mg | Freq: Four times a day (QID) | RECTAL | Status: DC | PRN

## 2021-11-13 MED ORDER — CINACALCET HCL 30 MG PO TABS
120.0000 mg | ORAL_TABLET | Freq: Every day | ORAL | Status: DC
  Administered 2021-11-14 – 2021-11-16 (×3): 120 mg via ORAL
  Filled 2021-11-13 (×3): qty 4

## 2021-11-13 MED ORDER — ONDANSETRON HCL 4 MG PO TABS: 4.0000 mg | ORAL_TABLET | Freq: Four times a day (QID) | ORAL | Status: DC | PRN

## 2021-11-13 MED ORDER — SODIUM CHLORIDE 0.9 % IV SOLN: 100.0000 mL | INTRAVENOUS | Status: DC | PRN

## 2021-11-13 MED ORDER — ONDANSETRON HCL 4 MG/2ML IJ SOLN
4.0000 mg | Freq: Once | INTRAMUSCULAR | Status: AC
  Administered 2021-11-13: 4 mg via INTRAVENOUS
  Filled 2021-11-13: qty 2

## 2021-11-13 MED ORDER — ALTEPLASE 2 MG IJ SOLR: 2.0000 mg | Freq: Once | INTRAMUSCULAR | Status: DC | PRN

## 2021-11-13 MED ORDER — EZETIMIBE 10 MG PO TABS
10.0000 mg | ORAL_TABLET | Freq: Every day | ORAL | Status: DC
  Administered 2021-11-14 – 2021-11-16 (×3): 10 mg via ORAL
  Filled 2021-11-13 (×3): qty 1

## 2021-11-13 MED ORDER — LIDOCAINE HCL (PF) 1 % IJ SOLN: 5.0000 mL | INTRAMUSCULAR | Status: DC | PRN

## 2021-11-13 MED ORDER — DOCUSATE SODIUM 100 MG PO CAPS
100.0000 mg | ORAL_CAPSULE | Freq: Two times a day (BID) | ORAL | Status: DC
  Administered 2021-11-13 – 2021-11-14 (×2): 100 mg via ORAL
  Filled 2021-11-13 (×4): qty 1

## 2021-11-13 MED ORDER — DEXTROSE 50 % IV SOLN: 0.0000 mL | INTRAVENOUS | Status: DC | PRN

## 2021-11-13 MED ORDER — INSULIN ASPART 100 UNIT/ML IV SOLN
10.0000 [IU] | Freq: Once | INTRAVENOUS | Status: AC
  Administered 2021-11-13: 10 [IU] via INTRAVENOUS
  Filled 2021-11-13: qty 0.1

## 2021-11-13 MED ORDER — CLOPIDOGREL BISULFATE 75 MG PO TABS
75.0000 mg | ORAL_TABLET | Freq: Every day | ORAL | Status: DC
  Administered 2021-11-13 – 2021-11-16 (×4): 75 mg via ORAL
  Filled 2021-11-13 (×4): qty 1

## 2021-11-13 MED ORDER — ONDANSETRON HCL 4 MG/2ML IJ SOLN
4.0000 mg | Freq: Four times a day (QID) | INTRAMUSCULAR | Status: DC | PRN
Start: 2021-11-13 — End: 2021-11-16
  Administered 2021-11-15: 4 mg via INTRAVENOUS
  Filled 2021-11-13: qty 2

## 2021-11-13 MED ORDER — ALBUTEROL SULFATE (2.5 MG/3ML) 0.083% IN NEBU
10.0000 mg | INHALATION_SOLUTION | Freq: Once | RESPIRATORY_TRACT | Status: AC
  Administered 2021-11-13: 10 mg via RESPIRATORY_TRACT
  Filled 2021-11-13: qty 12

## 2021-11-13 MED ORDER — LIDOCAINE-PRILOCAINE 2.5-2.5 % EX CREA: 1.0000 "application " | TOPICAL_CREAM | CUTANEOUS | Status: DC | PRN

## 2021-11-13 MED ORDER — HEPARIN SODIUM (PORCINE) 1000 UNIT/ML DIALYSIS: 1000.0000 [IU] | INTRAMUSCULAR | Status: DC | PRN

## 2021-11-13 MED ORDER — CHLORHEXIDINE GLUCONATE CLOTH 2 % EX PADS
6.0000 | MEDICATED_PAD | Freq: Every day | CUTANEOUS | Status: DC
  Administered 2021-11-14 – 2021-11-15 (×2): 6 via TOPICAL

## 2021-11-13 MED ORDER — APIXABAN 5 MG PO TABS
5.0000 mg | ORAL_TABLET | Freq: Two times a day (BID) | ORAL | Status: DC
  Administered 2021-11-13 – 2021-11-16 (×6): 5 mg via ORAL
  Filled 2021-11-13 (×5): qty 1

## 2021-11-13 MED ORDER — SODIUM CHLORIDE 0.9 % IV BOLUS
1000.0000 mL | Freq: Once | INTRAVENOUS | Status: AC
  Administered 2021-11-13: 1000 mL via INTRAVENOUS

## 2021-11-13 MED ORDER — ACETAMINOPHEN 325 MG PO TABS
650.0000 mg | ORAL_TABLET | Freq: Four times a day (QID) | ORAL | Status: DC | PRN
  Administered 2021-11-14: 650 mg via ORAL
  Filled 2021-11-13: qty 2

## 2021-11-13 MED ORDER — HEPARIN SODIUM (PORCINE) 5000 UNIT/ML IJ SOLN: 5000.0000 [IU] | Freq: Three times a day (TID) | INTRAMUSCULAR | Status: DC

## 2021-11-13 MED ORDER — INSULIN REGULAR(HUMAN) IN NACL 100-0.9 UT/100ML-% IV SOLN
INTRAVENOUS | Status: DC
  Administered 2021-11-13: 6 [IU]/h via INTRAVENOUS
  Filled 2021-11-13 (×2): qty 100

## 2021-11-13 MED ORDER — ASPIRIN 81 MG PO CHEW
81.0000 mg | CHEWABLE_TABLET | Freq: Every day | ORAL | Status: DC
  Administered 2021-11-13 – 2021-11-16 (×4): 81 mg via ORAL
  Filled 2021-11-13 (×4): qty 1

## 2021-11-13 MED ORDER — GABAPENTIN 300 MG PO CAPS
600.0000 mg | ORAL_CAPSULE | Freq: Every day | ORAL | Status: DC
  Administered 2021-11-13 – 2021-11-15 (×3): 600 mg via ORAL
  Filled 2021-11-13 (×3): qty 2

## 2021-11-13 MED ORDER — NITROGLYCERIN 0.4 MG SL SUBL
0.4000 mg | SUBLINGUAL_TABLET | Freq: Every day | SUBLINGUAL | Status: DC | PRN
  Administered 2021-11-15 (×2): 0.4 mg via SUBLINGUAL
  Filled 2021-11-13: qty 1

## 2021-11-13 MED ORDER — ATORVASTATIN CALCIUM 20 MG PO TABS
80.0000 mg | ORAL_TABLET | Freq: Every evening | ORAL | Status: DC
  Administered 2021-11-13 – 2021-11-15 (×3): 80 mg via ORAL
  Filled 2021-11-13 (×3): qty 4

## 2021-11-13 MED ORDER — SODIUM BICARBONATE 8.4 % IV SOLN
50.0000 meq | Freq: Once | INTRAVENOUS | Status: AC
  Administered 2021-11-13: 50 meq via INTRAVENOUS
  Filled 2021-11-13: qty 50

## 2021-11-13 MED ORDER — SERTRALINE HCL 50 MG PO TABS
150.0000 mg | ORAL_TABLET | Freq: Every morning | ORAL | Status: DC
  Administered 2021-11-14 – 2021-11-16 (×3): 150 mg via ORAL
  Filled 2021-11-13 (×3): qty 3

## 2021-11-13 NOTE — H&P (Addendum)
History and Physical    Patient: Raymond Lamb:[id number] DOB: [birth year] DOA: 11/13/2021 DOS: the patient was seen and examined on 11/13/2021  PCP: Norina Buzzard, MD   Patient coming from: Home  Chief Complaint:  Chief Complaint  Patient presents with   Hyperglycemia   HPI: Raymond Lamb is a 36 y.o. male with PMH significant for ESRD on hemodialysis, type 1 diabetes with history of DKA in the past, prior stroke, hypertension presented in the ED with complaints of generalized abdominal pain associated with nausea and vomiting.  Patient reports he has missed dialysis for last 2 sessions, describes abdominal pain is generalized, has threw up several times, not able to tolerate any oral intake.  Patient denies any chest pain shortness of breath, palpitation or dizziness.  ED course: He is hypotensive, tachycardic, tachypneic. Temp 98.2, HR 115, RR 25, BP 78/42, SPO2 98% on room air Labs include sodium 118, potassium 7.3, chloride 73, bicarb 9, glucose more than 1200, BUN 65, creatinine 14.77, calcium 7.8, anion gap 36, phosphorus 11, magnesium 2.3, alkaline phosphatase 18, albumin 3.8, lipase 122, AST 19, ALT 15, total protein 7.5, WBC 10.3, hemoglobin 11.3, hematocrit 39.1, MCV 104.3, platelet 180, VBG shows pH 7.1, PCO2 28, PO2 36, bicarb 8.7. Chest x-ray no acute infiltrate.  EKG shows sinus tachycardia, tall T waves.  Review of Systems: Review of Systems  Constitutional:  Positive for malaise/fatigue.  HENT: Negative.    Eyes: Negative.   Respiratory: Negative.    Cardiovascular: Negative.   Gastrointestinal:  Positive for abdominal pain, nausea and vomiting.  Genitourinary: Negative.   Musculoskeletal: Negative.   Skin: Negative.   Neurological:  Positive for weakness.  Endo/Heme/Allergies: Negative.   Psychiatric/Behavioral: Negative.      Past Medical History:  Diagnosis Date   Anemia of chronic disease    CAD (coronary artery disease)    a. 11/2015 Cath: LM nl, LAD  30p, LCX nl, OM2 70-80, RCA 50-9m; b. 08/2020 MV: EF 56%. No isch/infarct; c. 10/2020 NSTEMI in setting of DKA-->Cath: LM nl, LAD nl, D2 70, LCX nl, OM2 70, OM3 70 inf branch, RCA 80p/m-->Med rx.   ESRD (end stage renal disease) ([facility])    Essential hypertension    History of echocardiogram    a. 10/2020 Echo: EF >55%, nl RV fxn.   Mixed hyperlipidemia    PVD (peripheral vascular disease) ([place])    a. 07/2015 PTA/Stenting RSFA; b. 12/2015 LLE Fem->PTA bypass; c. 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 04/2020 Duplex: patent LLE bypass. Mild prox RSFA stenosis. Stable ABIs.   Stroke [facility])    Tobacco abuse    Type 1 diabetes ([place])    Past Surgical History:  Procedure Laterality Date   AVF Left    Social History:  reports that he has been smoking cigarettes. He has a 6.00 pack-year smoking history. He has never used smokeless tobacco. He reports current drug use. Drug: Marijuana. He reports that he does not drink alcohol.  Allergies  Allergen Reactions   Baclofen Itching and Other (See Comments)    Became disoriented, emesis   Iodinated Contrast Media Hives   Lisinopril Swelling   Other Swelling   Oxybenzone     Other reaction(s): Unknown    Family History  Problem Relation Age of Onset   Diabetes type I Other    Clotting disorder Neg Hx     Prior to Admission medications   Medication Sig Start Date End Date Taking? Authorizing Provider  amLODipine (NORVASC) 10  MG tablet Take 10 mg by mouth daily. 10/14/21  Yes [provider]  apixaban (ELIQUIS) 5 MG TABS tablet Take 5 mg by mouth 2 (two) times daily. 06/18/21  Yes [provider]  aspirin 81 MG chewable tablet Chew 81 mg by mouth daily. 11/25/20  Yes [provider]  atorvastatin (LIPITOR) 80 MG tablet Take 80 mg by mouth every evening. 09/29/20  Yes [provider]  cinacalcet (SENSIPAR) 60 MG tablet Take 120 mg by mouth daily. 11/08/20  Yes [provider]  clopidogrel (PLAVIX) 75 MG  tablet Take 75 mg by mouth daily. 08/30/20  Yes [provider]  ezetimibe (ZETIA) 10 MG tablet Take 10 mg by mouth daily. 02/10/21 02/10/22 Yes [provider]  gabapentin (NEURONTIN) 300 MG capsule Take 600 mg by mouth at bedtime. 09/28/20  Yes [provider]  hydrALAZINE (APRESOLINE) 50 MG tablet Take 50 mg by mouth 3 (three) times daily. 10/14/21  Yes [provider]  isosorbide mononitrate (IMDUR) 30 MG 24 hr tablet Take 30 mg by mouth daily. 10/14/21  Yes [provider]  metoprolol succinate (TOPROL-XL) 100 MG 24 hr tablet Take 100 mg by mouth daily. 10/14/21  Yes [provider]  NOVOLOG FLEXPEN 100 UNIT/ML FlexPen Inject 5 Units into the skin 3 (three) times daily. Use per sliding scale no more then 50 units a day Patient taking differently: Inject 2-6 Units into the skin 3 (three) times daily. - Take Novolog 2 units with meals. This is ONLY if you eat. In addition to this, you should add: - 1 unit if your pre-meal blood sugar is 150-250 - 2 units if your pre-meal blood sugar is 251-350 - 3 units if your pre-meal blood sugar is 351-450 - 4 units if your pre-meal blood sugar is 451+ 12/02/20  Yes Nolberto Hanlon, MD  omeprazole (PRILOSEC) 40 MG capsule Take 40 mg by mouth daily with breakfast. 08/05/21  Yes [provider]  sertraline (ZOLOFT) 100 MG tablet Take 150 mg by mouth in the morning. 11/25/20  Yes [provider]  ergocalciferol (VITAMIN D2) 1.25 MG (50000 UT) capsule Take 50,000 Units by mouth once a week. Patient not taking: Reported on 11/13/2021 08/11/20   [provider]  LANTUS SOLOSTAR 100 UNIT/ML Solostar Pen Inject 13 Units into the skin at bedtime. 12/17/20   Jennye Boroughs, MD  nitroGLYCERIN (NITROSTAT) 0.4 MG SL tablet Place 1 tablet under the tongue daily as needed. 11/20/20   [provider]    Physical Exam: Vitals:   11/13/21 1659 11/13/21 1704 11/13/21 1800 11/13/21 1915  BP: (!) 78/42 (!)  154/82 (!) 169/88   Pulse:   (!) 136 (!) 120  Resp:   20 (!) 22  Temp:      TempSrc:      SpO2:   97% 99%  Weight:      Height:       General exam: Appears comfortable, not in any acute distress, chronically ill looking Respiratory system: CTA bilaterally, decreased breath sounds, respiratory effort normal, RR 21 Cardiovascular system: S1-S2 heard, regular rate and rhythm, no murmur. Gastrointestinal system: Abdomen is soft, mildly tender, nondistended, BS+ Central nervous system: Alert, oriented x3, no focal neurological deficits Extremities: No edema, no cyanosis, no clubbing. Psychiatry: Mood, insight, judgment appropriate  Data Reviewed:  I have Reviewed nursing notes, Vitals, and Lab results since pt's last encounter. Pertinent lab results CBC, CMP, lipase, mag, Phos I have ordered test including CBC, CMP, mag, Phos  I have independently visualized and interpreted imaging chest x-ray which showed no acute infiltrate. I have independently visualized and interpreted EKG which showed EKG: Sinus rhythm, tall T waves. I have discussed pt's care plan and test results with patient.   Assessment and Plan: * High anion gap metabolic acidosis Patient presented with high anion gap metabolic acidosis likely DKA..   pH 7.1, anion gap 36, bicarb 9, blood glucose more than 1200. Patient was given sodium bicarbonate, initiated on insulin drip. Continue to monitor electrolytes.  Continue IV hydration.  Hyponatremia Sodium 118, could be false low due to hyperglycemia. Continue IV hydration, continue to monitor.  HTN (hypertension) Blood pressure is on the soft side so hold metoprolol, Imdur, hydralazine and amlodipine. Resume once blood pressure improves  Hyperkalemia Patient presented with potassium of 7.3, likely due to missed hemodialysis. EKG shows tall T waves. Patient was given calcium gluconate, dextrose 50, insulin 10 units, albuterol nebulization. Nephrology consulted,  Patient  is going to have dialysis today.   DKA, type 1 ([place]) Patient presented with nausea,  vomiting and abdominal pain. Blood glucose more than 1200, anion gap 36, bicarb 9 consistent with DKA. Initiated DKA protocol.  Continue insulin drip as per protocol Continue IV hydration.  Monitor blood glucose and electrolytes.  PVD (peripheral vascular disease) ([facility]) Continue aspirin Plavix Lipitor.  ESRD (end stage renal disease) ([place]) Patient has missed dialysis for last 2 sessions. Presented with nausea vomiting and abdominal pain likely due to uremia. Nephrology consulted.  Patient is having urgent hemodialysis today. Monitor clinical status.  Depression with anxiety Continue Zoloft.   Hx. Of CVA: Continue aspirin, Plavix, Lipitor.  Hx. Of PE: Continue Eliquis.    Advance Care Planning:   Code Status: Full Code   Consults: Nephrology  Family Communication: No family at bedside  Severity of Illness: The appropriate patient status for this patient is INPATIENT. Inpatient status is judged to be reasonable and necessary in order to provide the required intensity of service to ensure the patient's safety. The patient's presenting symptoms, physical exam findings, and initial radiographic and laboratory data in the context of their chronic comorbidities is felt to place them at high risk for further clinical deterioration. Furthermore, it is not anticipated that the patient will be medically stable for discharge from the hospital within 2 midnights of admission.   * I certify that at the point of admission it is my clinical judgment that the patient will require inpatient hospital care spanning beyond 2 midnights from the point of admission due to high intensity of service, high risk for further deterioration and high frequency of surveillance required.*  Author: Shawna Clamp, MD 11/13/2021 7:19 PM  For on call review [email or web address].

## 2021-11-13 NOTE — ED Provider Notes (Signed)
[facility] Provider Note    Event Date/Time   First MD Initiated Contact with Patient 11/13/21 Bosie Helper     (approximate)   History   Chief Complaint: Hyperglycemia   HPI  Raymond Lamb is a 36 y.o. male with a history of ESRD on hemodialysis with left upper extremity AV fistula, type 1 diabetes with prior history of DKA, prior stroke, hypertension who comes to the ED complaining of generalized abdominal pain and vomiting for the last 2 days.  Gradual onset, persistent and worsening.  Not able to tolerate any oral intake.  Patient reports that he missed dialysis yesterday, but family notes that he has not been to dialysis in a week.  Patient denies chest pain or palpitations or dizziness or syncope.  No shortness of breath.     Physical Exam   Triage Vital Signs: ED Triage Vitals  Enc Vitals Group     BP 11/13/21 1659 (!) 78/42     Pulse Rate 11/13/21 1656 (!) 115     Resp 11/13/21 1656 (!) 25     Temp 11/13/21 1656 98.2 F (36.8 C)     Temp Source 11/13/21 1656 Oral     SpO2 11/13/21 1656 98 %     Weight 11/13/21 1653 150 lb (68 kg)     Height 11/13/21 1653 5\' 9"  (1.753 m)     Head Circumference --      Peak Flow --      Pain Score 11/13/21 1653 0     Pain Loc --      Pain Edu? --      Excl. in [place]? --     Most recent vital signs: Vitals:   11/13/21 1704 11/13/21 1800  BP: (!) 154/82 (!) 169/88  Pulse:  (!) 136  Resp:  20  Temp:    SpO2:  97%    General: Awake, no distress.  Ill-appearing CV:  Good peripheral perfusion.  Tachycardia, heart rate 120.  Symmetric peripheral pulses.  Palpable thrill in left upper extremity AV fistula. Resp:  Normal effort.  Clear to auscultation bilaterally Abd:  No distention.  Soft and nontender Other:  Dry mucous membranes.   ED Results / Procedures / Treatments   Labs (all labs ordered are listed, but only abnormal results are displayed) Labs Reviewed  CBC WITH DIFFERENTIAL/PLATELET - Abnormal;  Notable for the following components:      Result Value   RBC 3.75 (*)    Hemoglobin 11.3 (*)    MCV 104.3 (*)    MCHC 28.9 (*)    RDW 16.0 (*)    Neutro Abs 9.1 (*)    Lymphs Abs 0.4 (*)    All other components within normal limits  BLOOD GAS, VENOUS - Abnormal; Notable for the following components:   pH, Ven 7.1 (*)    pCO2, Ven 28 (*)    Bicarbonate 8.7 (*)    Acid-base deficit 19.6 (*)    All other components within normal limits  COMPREHENSIVE METABOLIC PANEL - Abnormal; Notable for the following components:   Sodium 118 (*)    Potassium 7.3 (*)    Chloride 73 (*)    CO2 9 (*)    Glucose, Bld >1,200 (*)    BUN 65 (*)    Creatinine, Ser 14.77 (*)    Calcium 7.8 (*)    Total Bilirubin 2.3 (*)    GFR, Estimated 4 (*)    Anion gap 36 (*)  All other components within normal limits  LIPASE, BLOOD - Abnormal; Notable for the following components:   Lipase 122 (*)    All other components within normal limits  CBG MONITORING, ED - Abnormal; Notable for the following components:   Glucose-Capillary >600 (*)    All other components within normal limits  CBG MONITORING, ED - Abnormal; Notable for the following components:   Glucose-Capillary >600 (*)    All other components within normal limits  BETA-HYDROXYBUTYRIC ACID  BETA-HYDROXYBUTYRIC ACID  URINALYSIS, ROUTINE W REFLEX MICROSCOPIC  CBC  HEMOGLOBIN A1C  COMPREHENSIVE METABOLIC PANEL  CBC  MAGNESIUM  PHOSPHORUS  BASIC METABOLIC PANEL  BASIC METABOLIC PANEL  BASIC METABOLIC PANEL  BETA-HYDROXYBUTYRIC ACID  BETA-HYDROXYBUTYRIC ACID     EKG Interpreted by me Sinus tachycardia rate 116.  Left axis, LVH.  Significant baseline wander limits interpretation, but no obvious ST elevation or depression.  Peaked T waves are evident in the anterior precordial leads.  Repeat EKG interpreted by me, shows sinus tachycardia, rate of 115, persistent peaked T waves, no evolving ischemic changes.  No evidence of arrhythmia.   Narrow QRS complex.   RADIOLOGY Chest x-ray viewed and interpreted by me, appears normal.  Radiology report reviewed.   PROCEDURES:  .Critical Care Performed by: Carrie Mew, MD Authorized by: Carrie Mew, MD   Critical care provider statement:    Critical care time (minutes):  40   Critical care time was exclusive of:  Separately billable procedures and treating other patients   Critical care was necessary to treat or prevent imminent or life-threatening deterioration of the following conditions:  Renal failure, cardiac failure, metabolic crisis and endocrine crisis   Critical care was time spent personally by me on the following activities:  Development of treatment plan with patient or surrogate, discussions with consultants, evaluation of patient's response to treatment, examination of patient, obtaining history from patient or surrogate, ordering and performing treatments and interventions, ordering and review of laboratory studies, ordering and review of radiographic studies, pulse oximetry, re-evaluation of patient's condition and review of old charts   Care discussed with: admitting provider   Comments:        .1-3 Lead EKG Interpretation Performed by: Carrie Mew, MD Authorized by: Carrie Mew, MD     Interpretation: abnormal     ECG rate:  117   ECG rate assessment: tachycardic     Rhythm: sinus tachycardia     Ectopy: none     Conduction: normal     MEDICATIONS ORDERED IN ED: Medications  insulin regular, human (MYXREDLIN) 100 units/ 100 mL infusion (has no administration in time range)  lactated ringers infusion (has no administration in time range)  dextrose 5 % in lactated ringers infusion (has no administration in time range)  dextrose 50 % solution 0-50 mL (has no administration in time range)  heparin injection 5,000 Units (has no administration in time range)  acetaminophen (TYLENOL) tablet 650 mg (has no administration in time  range)    Or  acetaminophen (TYLENOL) suppository 650 mg (has no administration in time range)  docusate sodium (COLACE) capsule 100 mg (has no administration in time range)  ondansetron (ZOFRAN) tablet 4 mg (has no administration in time range)    Or  ondansetron (ZOFRAN) injection 4 mg (has no administration in time range)  heparin injection 5,000 Units (has no administration in time range)  lactated ringers infusion (has no administration in time range)  dextrose 5 % in lactated ringers infusion (has no  administration in time range)  dextrose 50 % solution 0-50 mL (has no administration in time range)  insulin regular, human (MYXREDLIN) 100 units/ 100 mL infusion (has no administration in time range)  ondansetron (ZOFRAN) injection 4 mg (4 mg Intravenous Given 11/13/21 1708)  sodium chloride 0.9 % bolus 1,000 mL (1,000 mLs Intravenous New Bag/Given 11/13/21 1710)  calcium gluconate inj 10% (1 g) URGENT USE ONLY! (1 g Intravenous Given 11/13/21 1757)  albuterol (PROVENTIL) (2.5 MG/3ML) 0.083% nebulizer solution 10 mg (10 mg Nebulization Given 11/13/21 1754)  insulin aspart (novoLOG) injection 10 Units (10 Units Intravenous Given 11/13/21 1806)  sodium bicarbonate injection 50 mEq (50 mEq Intravenous Given 11/13/21 1757)     IMPRESSION / MDM / ASSESSMENT AND PLAN / ED COURSE  I reviewed the triage vital signs and the nursing notes.                              Differential diagnosis includes, but is not limited to, hyperkalemia, hyponatremia, hyperglycemia with DKA, dehydration, pancreatitis, gastritis, biliary disease, colitis, diverticulitis, viral illness  Patient's presentation is most consistent with acute presentation with potential threat to life or bodily function.  Patient presents with abdominal pain vomiting and tachycardia.  Other vital signs unremarkable.  He is ill-appearing and has a fingerstick blood sugar greater than 600 on arrival.  Exam is overall nonfocal.  Initial  labs show hyperkalemia with a K of 7.3.  Sodium is 118.  Anion gap is 36, and blood sugar is greater than 1200.  VBG pH is 7.1.  This confirms diabetic ketoacidosis with concomitant hyperkalemia with EKG changes evidenced by peaked T waves.   Case discussed with nephrology who is arranging for emergent hemodialysis.  Patient is hemodynamically stable, normal mental status, maintaining and protecting his airway, unconcerning blood pressure.  Case discussed with hospitalist for stepdown admission.         FINAL CLINICAL IMPRESSION(S) / ED DIAGNOSES   Final diagnoses:  Hyperkalemia  Type 1 diabetes mellitus with ketoacidosis without coma ([place])  ESRD on hemodialysis ([place])     Rx / DC Orders   ED Discharge Orders     None        Note:  This document was prepared using Dragon voice recognition software and may include unintentional dictation errors.   Carrie Mew, MD 11/13/21 [phone]

## 2021-11-13 NOTE — ED Triage Notes (Signed)
BIB from EMS from home. BGL greater than 600. Pt did go to dialysis saturday. pt has been vomiting for 2 days.   192/110 118 HR   4MG  of Zofran via IV

## 2021-11-13 NOTE — Assessment & Plan Note (Signed)
Continue aspirin Plavix Lipitor.

## 2021-11-13 NOTE — Assessment & Plan Note (Addendum)
Patient presented with potassium of 7.3, likely due to missed hemodialysis. EKG shows tall T waves. Patient was given calcium gluconate, dextrose 50, insulin 10 units, albuterol nebulization. Nephrology consulted,  Patient is going to have dialysis today.

## 2021-11-13 NOTE — Progress Notes (Signed)
Patient arrived to ICU-19

## 2021-11-13 NOTE — Assessment & Plan Note (Signed)
Patient has missed dialysis for last 2 sessions. Presented with nausea vomiting and abdominal pain likely due to uremia. Nephrology consulted.  Patient is having urgent hemodialysis today. Monitor clinical status.

## 2021-11-13 NOTE — Assessment & Plan Note (Signed)
Continue Zoloft 

## 2021-11-13 NOTE — Progress Notes (Signed)
Written consent obtained from patient to start HD. Patient verbalized understanding of need for procedure. [facility] RN, Baldo Daub at bedside to initiate HD.

## 2021-11-13 NOTE — Plan of Care (Addendum)
A&O patient admitted to ICU-19 with insuline gtt infusing. Patient came to ED with c/o nausea, vomiting, abdominal pain, glucose >1,200 and needing HD. Patient and his mother reported to ED RN that he has missed dialysis multiple times this week. Patient has a fistula to left forearm and reports that he is anuric.

## 2021-11-13 NOTE — Assessment & Plan Note (Addendum)
Patient presented with high anion gap metabolic acidosis likely DKA..   pH 7.1, anion gap 36, bicarb 9, blood glucose more than 1200. Patient was given sodium bicarbonate, initiated on insulin drip. Continue to monitor electrolytes.  Continue IV hydration.

## 2021-11-13 NOTE — ED Notes (Signed)
Pt states he makes NO urine.... so no UA to be collected.

## 2021-11-13 NOTE — Assessment & Plan Note (Signed)
Patient presented with nausea,  vomiting and abdominal pain. Blood glucose more than 1200, anion gap 36, bicarb 9 consistent with DKA. Initiated DKA protocol.  Continue insulin drip as per protocol Continue IV hydration.  Monitor blood glucose and electrolytes.

## 2021-11-13 NOTE — ED Notes (Signed)
Pt mom came to nurses desk and advised pt has missed multiple days of dialysis this week.

## 2021-11-13 NOTE — Consult Note (Signed)
Referring Provider: No ref. provider found Primary Care Physician:  Norina Buzzard, MD Primary Nephrologist:  Dr.   Luiz Iron for Consultation:    HPI: Raymond Lamb is a 36 y.o. male with a history of ESRD on hemodialysis with left upper extremity AV fistula, type 1 diabetes with prior history of DKA, peripheral vascular disease, prior stroke, hypertension who comes to the ED complaining of generalized abdominal pain and vomiting for the last 2 days.  Gradual onset, persistent and worsening.  Not able to tolerate any oral intake.  Patient reports that he missed dialysis yesterday, but family notes that he has not been to dialysis in a week.  Patient denies chest pain or palpitations or dizziness or syncope.  No shortness of breath.  Patient was found to have hyperkalemia with a potassium of 7.3. He also has hyperglycemia with hyponatremia. As per his mother he has been noncompliant with his dialysis treatments.  He is supposed to be on it Tuesday Thursday Saturday schedule for dialysis.  Past Medical History:  Diagnosis Date   Anemia of chronic disease    CAD (coronary artery disease)    a. 11/2015 Cath: LM nl, LAD 30p, LCX nl, OM2 70-80, RCA 50-26m; b. 08/2020 MV: EF 56%. No isch/infarct; c. 10/2020 NSTEMI in setting of DKA-->Cath: LM nl, LAD nl, D2 70, LCX nl, OM2 70, OM3 70 inf branch, RCA 80p/m-->Med rx.   ESRD (end stage renal disease) ([place])    Essential hypertension    History of echocardiogram    a. 10/2020 Echo: EF >55%, nl RV fxn.   Mixed hyperlipidemia    PVD (peripheral vascular disease) ([place])    a. 07/2015 PTA/Stenting RSFA; b. 12/2015 LLE Fem->PTA bypass; c. 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 04/2020 Duplex: patent LLE bypass. Mild prox RSFA stenosis. Stable ABIs.   Stroke [facility])    Tobacco abuse    Type 1 diabetes ([place])     Past Surgical History:  Procedure Laterality Date   AVF Left     Prior to Admission medications   Medication Sig Start Date End Date Taking?  Authorizing Provider  amLODipine (NORVASC) 10 MG tablet Take 10 mg by mouth daily. 10/14/21  Yes [provider]  apixaban (ELIQUIS) 5 MG TABS tablet Take 5 mg by mouth 2 (two) times daily. 06/18/21  Yes [provider]  aspirin 81 MG chewable tablet Chew 81 mg by mouth daily. 11/25/20  Yes [provider]  atorvastatin (LIPITOR) 80 MG tablet Take 80 mg by mouth every evening. 09/29/20  Yes [provider]  cinacalcet (SENSIPAR) 60 MG tablet Take 120 mg by mouth daily. 11/08/20  Yes [provider]  clopidogrel (PLAVIX) 75 MG tablet Take 75 mg by mouth daily. 08/30/20  Yes [provider]  ezetimibe (ZETIA) 10 MG tablet Take 10 mg by mouth daily. 02/10/21 02/10/22 Yes [provider]  gabapentin (NEURONTIN) 300 MG capsule Take 600 mg by mouth at bedtime. 09/28/20  Yes [provider]  hydrALAZINE (APRESOLINE) 50 MG tablet Take 50 mg by mouth 3 (three) times daily. 10/14/21  Yes [provider]  isosorbide mononitrate (IMDUR) 30 MG 24 hr tablet Take 30 mg by mouth daily. 10/14/21  Yes [provider]  metoprolol succinate (TOPROL-XL) 100 MG 24 hr tablet Take 100 mg by mouth daily. 10/14/21  Yes [provider]  NOVOLOG FLEXPEN 100 UNIT/ML FlexPen Inject 5 Units into the skin 3 (three) times daily. Use per sliding scale no more then  50 units a day Patient taking differently: Inject 2-6 Units into the skin 3 (three) times daily. - Take Novolog 2 units with meals. This is ONLY if you eat. In addition to this, you should add: - 1 unit if your pre-meal blood sugar is 150-250 - 2 units if your pre-meal blood sugar is 251-350 - 3 units if your pre-meal blood sugar is 351-450 - 4 units if your pre-meal blood sugar is 451+ 12/02/20  Yes Nolberto Hanlon, MD  omeprazole (PRILOSEC) 40 MG capsule Take 40 mg by mouth daily with breakfast. 08/05/21  Yes [provider]  sertraline (ZOLOFT) 100 MG tablet Take 150 mg by mouth in  the morning. 11/25/20  Yes [provider]  ergocalciferol (VITAMIN D2) 1.25 MG (50000 UT) capsule Take 50,000 Units by mouth once a week. Patient not taking: Reported on 11/13/2021 08/11/20   [provider]  LANTUS SOLOSTAR 100 UNIT/ML Solostar Pen Inject 13 Units into the skin at bedtime. 12/17/20   Jennye Boroughs, MD  nitroGLYCERIN (NITROSTAT) 0.4 MG SL tablet Place 1 tablet under the tongue daily as needed. 11/20/20   [provider]    Current Facility-Administered Medications  Medication Dose Route Frequency Provider Last Rate Last Admin   acetaminophen (TYLENOL) tablet 650 mg  650 mg Oral Q6H PRN Shawna Clamp, MD       Or   acetaminophen (TYLENOL) suppository 650 mg  650 mg Rectal Q6H PRN Shawna Clamp, MD       dextrose 5 % in lactated ringers infusion   Intravenous Continuous Carrie Mew, MD       dextrose 50 % solution 0-50 mL  0-50 mL Intravenous PRN Carrie Mew, MD       docusate sodium (COLACE) capsule 100 mg  100 mg Oral BID Shawna Clamp, MD       heparin injection 5,000 Units  5,000 Units Subcutaneous Q8H Shawna Clamp, MD       insulin regular, human (MYXREDLIN) 100 units/ 100 mL infusion   Intravenous Continuous Shawna Clamp, MD 6 mL/hr at 11/13/21 1854 6 Units/hr at 11/13/21 1854   lactated ringers infusion   Intravenous Continuous Shawna Clamp, MD       ondansetron [facility]) tablet 4 mg  4 mg Oral Q6H PRN Shawna Clamp, MD       Or   ondansetron [facility]) injection 4 mg  4 mg Intravenous Q6H PRN Shawna Clamp, MD       Current Outpatient Medications  Medication Sig Dispense Refill   amLODipine (NORVASC) 10 MG tablet Take 10 mg by mouth daily.     apixaban (ELIQUIS) 5 MG TABS tablet Take 5 mg by mouth 2 (two) times daily.     aspirin 81 MG chewable tablet Chew 81 mg by mouth daily.     atorvastatin (LIPITOR) 80 MG tablet Take 80 mg by mouth every evening.     cinacalcet (SENSIPAR) 60 MG tablet Take 120 mg by mouth daily.      clopidogrel (PLAVIX) 75 MG tablet Take 75 mg by mouth daily.     ezetimibe (ZETIA) 10 MG tablet Take 10 mg by mouth daily.     gabapentin (NEURONTIN) 300 MG capsule Take 600 mg by mouth at bedtime.     hydrALAZINE (APRESOLINE) 50 MG tablet Take 50 mg by mouth 3 (three) times daily.     isosorbide mononitrate (IMDUR) 30 MG 24 hr tablet Take 30 mg by mouth daily.     metoprolol succinate (TOPROL-XL) 100 MG  24 hr tablet Take 100 mg by mouth daily.     NOVOLOG FLEXPEN 100 UNIT/ML FlexPen Inject 5 Units into the skin 3 (three) times daily. Use per sliding scale no more then 50 units a day (Patient taking differently: Inject 2-6 Units into the skin 3 (three) times daily. - Take Novolog 2 units with meals. This is ONLY if you eat. In addition to this, you should add: - 1 unit if your pre-meal blood sugar is 150-250 - 2 units if your pre-meal blood sugar is 251-350 - 3 units if your pre-meal blood sugar is 351-450 - 4 units if your pre-meal blood sugar is 451+) 15 mL 11   omeprazole (PRILOSEC) 40 MG capsule Take 40 mg by mouth daily with breakfast.     sertraline (ZOLOFT) 100 MG tablet Take 150 mg by mouth in the morning.     ergocalciferol (VITAMIN D2) 1.25 MG (50000 UT) capsule Take 50,000 Units by mouth once a week. (Patient not taking: Reported on 11/13/2021)     LANTUS SOLOSTAR 100 UNIT/ML Solostar Pen Inject 13 Units into the skin at bedtime. 15 mL 11   nitroGLYCERIN (NITROSTAT) 0.4 MG SL tablet Place 1 tablet under the tongue daily as needed.      Allergies as of 11/13/2021 - Review Complete 11/13/2021  Allergen Reaction Noted   Baclofen Itching and Other (See Comments) 12/11/2015   Iodinated contrast media Hives 09/03/2018   Lisinopril Swelling 04/15/2015   Other Swelling 04/30/2019   Oxybenzone  07/19/2015    Family History  Problem Relation Age of Onset   Diabetes type I Other    Clotting disorder Neg Hx     Social History   Socioeconomic History   Marital status: Single     Spouse name: Not on file   Number of children: Not on file   Years of education: Not on file   Highest education level: Not on file  Occupational History   Not on file  Tobacco Use   Smoking status: Every Day    Packs/day: 0.30    Years: 20.00    Pack years: 6.00    Types: Cigarettes   Smokeless tobacco: Never   Tobacco comments:    Currently smoking about six cigarettes/day.  Vaping Use   Vaping Use: Never used  Substance and Sexual Activity   Alcohol use: Never   Drug use: Yes    Types: Marijuana    Comment: rarely   Sexual activity: Yes  Other Topics Concern   Not on file  Social History Narrative   Not on file   Social Determinants of Health   Financial Resource Strain: Not on file  Food Insecurity: Not on file  Transportation Needs: Not on file  Physical Activity: Not on file  Stress: Not on file  Social Connections: Not on file  Intimate Partner Violence: Not on file    Physical Exam: Vital signs in last 24 hours: Temp:  [98.2 F (36.8 C)] 98.2 F (36.8 C) (05/21 1656) Pulse Rate:  [115-136] 136 (05/21 1800) Resp:  [20-25] 20 (05/21 1800) BP: (78-169)/(42-88) 169/88 (05/21 1800) SpO2:  [97 %-98 %] 97 % (05/21 1800) Weight:  [68 kg] 68 kg (05/21 1653)   General:   Alert,  Well-developed, well-nourished, pleasant and cooperative in NAD Head:  Normocephalic and atraumatic. Eyes:  Sclera clear, no icterus.   Conjunctiva pink. Ears:  Normal auditory acuity. Nose:  No deformity, discharge,  or lesions. Lungs:  Clear throughout to auscultation.  No wheezes, crackles, or rhonchi. No acute distress. Heart:  Regular rate and rhythm; no murmurs, clicks, rubs,  or gallops. Abdomen:  Soft, nontender and nondistended. No masses, hepatosplenomegaly or hernias noted. Normal bowel sounds, without guarding, and without rebound.   Extremities:  Without clubbing or edema.  Intake/Output from previous day: No intake/output data recorded. Intake/Output this shift: No  intake/output data recorded.  Lab Results: Recent Labs    11/13/21 1657  WBC 10.3  HGB 11.3*  HCT 39.1  PLT 180   BMET Recent Labs    11/13/21 1657  NA 118*  K 7.3*  CL 73*  CO2 9*  GLUCOSE >1,200*  BUN 65*  CREATININE 14.77*  CALCIUM 7.8*   LFT Recent Labs    11/13/21 1657  PROT 7.5  ALBUMIN 3.8  AST 19  ALT 15  ALKPHOS 81  BILITOT 2.3*   PT/INR No results for input(s): LABPROT, INR in the last 72 hours. Hepatitis Panel No results for input(s): HEPBSAG, HCVAB, HEPAIGM, HEPBIGM in the last 72 hours.  Studies/Results: DG Chest Portable 1 View  Result Date: 11/13/2021 CLINICAL DATA:  Vomiting for 2 days. Hyperglycemia. End-stage renal disease on dialysis. EXAM: PORTABLE CHEST 1 VIEW COMPARISON:  10/08/2021 FINDINGS: The heart size and mediastinal contours are within normal limits. Both lungs are clear. The visualized skeletal structures are unremarkable. IMPRESSION: No active disease. Electronically Signed   By: Marlaine Hind M.D.   On: 11/13/2021 17:29    Assessment/Plan:  Raymond Lamb is a 36 y.o. male with a history of ESRD on hemodialysis with left upper extremity AV fistula, type 1 diabetes with prior history of DKA, peripheral vascular disease, prior stroke, hypertension who comes to the ED complaining of generalized abdominal pain and vomiting for the last 2 days.  Gradual onset, persistent and worsening.  Not able to tolerate any oral intake.  Patient reports that he missed dialysis yesterday, but family notes that he has not been to dialysis in a week.  Patient denies chest pain or palpitations or dizziness or syncope.  No shortness of breath.  Patient was found to have hyperkalemia with a potassium of 7.3. He also has hyperglycemia with hyponatremia. As per his mother he has been noncompliant with his dialysis treatments.  He is supposed to be on it Tuesday Thursday Saturday schedule for dialysis.   ESRD: We will dialyze emergently for 2 and half hours  today.  He will be dialyzed on a 2K bath via the AV fistula. Hyperkalemia: Patient received insulin IV, sodium bicarbonate and some gluconate and albuterol nebulizer.  ANEMIA: We will continue the protocols.  MBD: We will check PTH, calcium and phosphorus.  HTN/VOL: We will monitor blood pressure and dose antihypertensive medications accordingly.  ACCESS: AV fistula patent. Hyponatremia: Sodium is corrected to 130 as adjusted with the glucose level. Diabetes: Continue insulin as ordered.  Spoke to patient's mother she is agreeable with the plan.    LOS: 0 Lyla Son, MD [facility] @TODAY @7 :05 PM

## 2021-11-13 NOTE — Assessment & Plan Note (Addendum)
Sodium 118, could be false low due to hyperglycemia. Continue IV hydration, continue to monitor.

## 2021-11-13 NOTE — Assessment & Plan Note (Addendum)
Blood pressure is on the soft side so hold metoprolol, Imdur, hydralazine and amlodipine. Resume once blood pressure improves

## 2021-11-13 NOTE — Progress Notes (Signed)
2nd PIV started as per request.  Secure chat sent to Crystal and Levada Dy RN re BP cuff removed from directly over existing PIV.Encourage vein salvage due to renal hx.

## 2021-11-13 NOTE — Hospital Course (Signed)
This 36 years old male with PMH significant for ESRD on hemodialysis, type 1 diabetes with history of DKA in the past, prior stroke, hypertension presented in the ED with complaints of generalized abdominal pain associated with nausea and vomiting.  Patient reports he has missed dialysis for last 2 sessions, describes abdominal pain is generalized, has threw up several times, not able to tolerate any oral intake.  Patient denies any chest pain shortness of breath, palpitation or dizziness.  ED course: He is hypotensive, tachycardic, tachypneic. Temp 98.2, HR 115, RR 25, BP 78/42, SPO2 98% on room air Labs include sodium 118, potassium 7.3, chloride 73, bicarb 9, glucose more than 1200, BUN 65, creatinine 14.77, calcium 7.8, anion gap 36, phosphorus 11, magnesium 2.3, alkaline phosphatase 18, albumin 3.8, lipase 122, AST 19, ALT 15, total protein 7.5, WBC 10.3, hemoglobin 11.3, hematocrit 39.1, MCV 104.3, platelet 180, VBG shows pH 7.1, PCO2 28, PO2 36, bicarb 8.7. Chest x-ray no acute infiltrate.  EKG shows sinus tachycardia, tall T waves.

## 2021-11-14 DIAGNOSIS — N186 End stage renal disease: Secondary | ICD-10-CM

## 2021-11-14 DIAGNOSIS — E1065 Type 1 diabetes mellitus with hyperglycemia: Secondary | ICD-10-CM | POA: Diagnosis not present

## 2021-11-14 DIAGNOSIS — E1010 Type 1 diabetes mellitus with ketoacidosis without coma: Secondary | ICD-10-CM | POA: Diagnosis not present

## 2021-11-14 LAB — BASIC METABOLIC PANEL
Anion gap: 15 (ref 5–15)
Anion gap: 15 (ref 5–15)
BUN: 33 mg/dL — ABNORMAL HIGH (ref 6–20)
BUN: 33 mg/dL — ABNORMAL HIGH (ref 6–20)
CO2: 27 mmol/L (ref 22–32)
CO2: 27 mmol/L (ref 22–32)
Calcium: 8.9 mg/dL (ref 8.9–10.3)
Calcium: 9 mg/dL (ref 8.9–10.3)
Chloride: 98 mmol/L (ref 98–111)
Chloride: 95 mmol/L — ABNORMAL LOW (ref 98–111)
Creatinine, Ser: 8.98 mg/dL — ABNORMAL HIGH (ref 0.61–1.24)
Creatinine, Ser: 9.48 mg/dL — ABNORMAL HIGH (ref 0.61–1.24)
GFR, Estimated: 7 mL/min — ABNORMAL LOW (ref >60)
GFR, Estimated: 7 mL/min — ABNORMAL LOW (ref >60)
Glucose, Bld: 181 mg/dL — ABNORMAL HIGH (ref 70–99)
Glucose, Bld: 165 mg/dL — ABNORMAL HIGH (ref 70–99)
Potassium: 3.6 mmol/L (ref 3.5–5.1)
Potassium: 3.3 mmol/L — ABNORMAL LOW (ref 3.5–5.1)
Sodium: 140 mmol/L (ref 135–145)
Sodium: 137 mmol/L (ref 135–145)

## 2021-11-14 LAB — COMPREHENSIVE METABOLIC PANEL
ALT: 13 U/L (ref 0–44)
AST: 18 U/L (ref 15–41)
Albumin: 3.8 g/dL (ref 3.5–5.0)
Alkaline Phosphatase: 76 U/L (ref 38–126)
Anion gap: 17 — ABNORMAL HIGH (ref 5–15)
BUN: 32 mg/dL — ABNORMAL HIGH (ref 6–20)
CO2: 25 mmol/L (ref 22–32)
Calcium: 8.8 mg/dL — ABNORMAL LOW (ref 8.9–10.3)
Chloride: 94 mmol/L — ABNORMAL LOW (ref 98–111)
Creatinine, Ser: 8.62 mg/dL — ABNORMAL HIGH (ref 0.61–1.24)
GFR, Estimated: 7 mL/min — ABNORMAL LOW (ref >60)
Glucose, Bld: 281 mg/dL — ABNORMAL HIGH (ref 70–99)
Potassium: 3.4 mmol/L — ABNORMAL LOW (ref 3.5–5.1)
Sodium: 136 mmol/L (ref 135–145)
Total Bilirubin: 1.4 mg/dL — ABNORMAL HIGH (ref 0.3–1.2)
Total Protein: 7.3 g/dL (ref 6.5–8.1)

## 2021-11-14 LAB — GLUCOSE, CAPILLARY
Glucose-Capillary: 103 mg/dL — ABNORMAL HIGH (ref 70–99)
Glucose-Capillary: 117 mg/dL — ABNORMAL HIGH (ref 70–99)
Glucose-Capillary: 140 mg/dL — ABNORMAL HIGH (ref 70–99)
Glucose-Capillary: 153 mg/dL — ABNORMAL HIGH (ref 70–99)
Glucose-Capillary: 159 mg/dL — ABNORMAL HIGH (ref 70–99)
Glucose-Capillary: 174 mg/dL — ABNORMAL HIGH (ref 70–99)
Glucose-Capillary: 175 mg/dL — ABNORMAL HIGH (ref 70–99)
Glucose-Capillary: 181 mg/dL — ABNORMAL HIGH (ref 70–99)
Glucose-Capillary: 184 mg/dL — ABNORMAL HIGH (ref 70–99)
Glucose-Capillary: 205 mg/dL — ABNORMAL HIGH (ref 70–99)
Glucose-Capillary: 246 mg/dL — ABNORMAL HIGH (ref 70–99)
Glucose-Capillary: 291 mg/dL — ABNORMAL HIGH (ref 70–99)
Glucose-Capillary: 314 mg/dL — ABNORMAL HIGH (ref 70–99)
Glucose-Capillary: 359 mg/dL — ABNORMAL HIGH (ref 70–99)
Glucose-Capillary: 376 mg/dL — ABNORMAL HIGH (ref 70–99)
Glucose-Capillary: 49 mg/dL — ABNORMAL LOW (ref 70–99)
Glucose-Capillary: 65 mg/dL — ABNORMAL LOW (ref 70–99)
Glucose-Capillary: >600 mg/dL (ref 70–99)

## 2021-11-14 LAB — HEPATITIS B SURFACE ANTIGEN: Hepatitis B Surface Ag: NONREACTIVE

## 2021-11-14 LAB — CBC
HCT: 32.9 % — ABNORMAL LOW (ref 39.0–52.0)
Hemoglobin: 11.5 g/dL — ABNORMAL LOW (ref 13.0–17.0)
MCH: 30.4 pg (ref 26.0–34.0)
MCHC: 35 g/dL (ref 30.0–36.0)
MCV: 87 fL (ref 80.0–100.0)
Platelets: 189 10*3/uL (ref 150–400)
RBC: 3.78 MIL/uL — ABNORMAL LOW (ref 4.22–5.81)
RDW: 15 % (ref 11.5–15.5)
WBC: 10.8 10*3/uL — ABNORMAL HIGH (ref 4.0–10.5)
nRBC: 0 % (ref 0.0–0.2)

## 2021-11-14 LAB — PHOSPHORUS: Phosphorus: 3.6 mg/dL (ref 2.5–4.6)

## 2021-11-14 LAB — HEPATITIS B SURFACE ANTIBODY,QUALITATIVE: Hep B S Ab: REACTIVE — AB

## 2021-11-14 LAB — BETA-HYDROXYBUTYRIC ACID
Beta-Hydroxybutyric Acid: 2.62 mmol/L — ABNORMAL HIGH (ref 0.05–0.27)
Beta-Hydroxybutyric Acid: 1.04 mmol/L — ABNORMAL HIGH (ref 0.05–0.27)

## 2021-11-14 LAB — MAGNESIUM: Magnesium: 1.8 mg/dL (ref 1.7–2.4)

## 2021-11-14 MED ORDER — INSULIN ASPART 100 UNIT/ML IJ SOLN
0.0000 [IU] | INTRAMUSCULAR | Status: DC
  Administered 2021-11-14: 5 [IU] via SUBCUTANEOUS
  Administered 2021-11-14: 2 [IU] via SUBCUTANEOUS
  Administered 2021-11-14: 9 [IU] via SUBCUTANEOUS
  Administered 2021-11-15: 5 [IU] via SUBCUTANEOUS
  Administered 2021-11-15: 9 [IU] via SUBCUTANEOUS
  Filled 2021-11-14 (×5): qty 1

## 2021-11-14 MED ORDER — POTASSIUM CHLORIDE CRYS ER 20 MEQ PO TBCR: 20.0000 meq | EXTENDED_RELEASE_TABLET | Freq: Once | ORAL | Status: DC

## 2021-11-14 MED ORDER — LORATADINE 10 MG PO TABS
10.0000 mg | ORAL_TABLET | Freq: Every day | ORAL | Status: DC
  Administered 2021-11-14 – 2021-11-16 (×3): 10 mg via ORAL
  Filled 2021-11-14 (×3): qty 1

## 2021-11-14 MED ORDER — POTASSIUM CHLORIDE CRYS ER 20 MEQ PO TBCR
40.0000 meq | EXTENDED_RELEASE_TABLET | Freq: Once | ORAL | Status: AC
  Administered 2021-11-14: 40 meq via ORAL
  Filled 2021-11-14: qty 2

## 2021-11-14 MED ORDER — PHENOL 1.4 % MT LIQD
1.0000 | OROMUCOSAL | Status: DC | PRN
  Filled 2021-11-14: qty 177

## 2021-11-14 MED ORDER — INSULIN GLARGINE-YFGN 100 UNIT/ML ~~LOC~~ SOLN
8.0000 [IU] | Freq: Every day | SUBCUTANEOUS | Status: DC
  Administered 2021-11-14 – 2021-11-16 (×3): 8 [IU] via SUBCUTANEOUS
  Filled 2021-11-14 (×3): qty 0.08

## 2021-11-14 MED ORDER — HYDROCORTISONE 1 % EX CREA
1.0000 "application " | TOPICAL_CREAM | Freq: Four times a day (QID) | CUTANEOUS | Status: DC | PRN
  Administered 2021-11-16: 1 via TOPICAL
  Filled 2021-11-14 (×2): qty 28

## 2021-11-14 NOTE — Progress Notes (Signed)
[facility]  ROUNDING NOTE   Subjective:   Patient seen and evaluated at the bedside. Resting comfortably Denies shortness of breath No lower extremity edema  Objective:  Vital signs in last 24 hours:  Temp:  [98.2 F (36.8 C)-98.7 F (37.1 C)] 98.6 F (37 C) (05/22 1200) Pulse Rate:  [90-136] 97 (05/22 1200) Resp:  [14-38] 16 (05/22 1200) BP: (77-180)/(42-121) 153/71 (05/22 1200) SpO2:  [93 %-100 %] 93 % (05/22 1200) Weight:  [62.9 kg-68 kg] 62.9 kg (05/21 2355)  Weight change:  Filed Weights   11/13/21 1653 11/13/21 2015 11/13/21 2355  Weight: 68 kg 65.6 kg 62.9 kg    Intake/Output: I/O last 3 completed shifts: In: 1457.8 [I.V.:1457.8] Out: 2504 [JKDTO:6712]   Intake/Output this shift:  Total I/O In: 732 [P.O.:120; I.V.:612] Out: -   Physical Exam: General: NAD  Head: Normocephalic, atraumatic. Moist oral mucosal membranes  Eyes: Anicteric  Lungs:  Clear to auscultation, normal effort  Heart: Regular rate and rhythm  Abdomen:  Soft, nontender  Extremities:  No peripheral edema.  Neurologic: Nonfocal, moving all four extremities  Skin: No lesions  Access: Lt AVF    Basic Metabolic Panel: Recent Labs  Lab 11/13/21 1657 11/13/21 1948 11/13/21 2049 11/14/21 0123 11/14/21 0435 11/14/21 0900  NA 118* 123*  122*  --  136 140 137  K 7.3* 4.6  4.5 3.0* 3.4* 3.6 3.3*  CL 73* 77*  76*  --  94* 98 95*  CO2 9* <7*  <7*  --  25 27 27   GLUCOSE >1,200* >1,200*  >1,200*  --  281* 181* 165*  BUN 65* 65*  66*  --  32* 33* 33*  CREATININE 14.77* 14.73*  14.94*  --  8.62* 8.98* 9.48*  CALCIUM 7.8* 7.6*  7.5*  --  8.8* 8.9 9.0  MG  --   --   --  1.8  --   --   PHOS  --  10.9*  --  3.6  --   --     Liver Function Tests: Recent Labs  Lab 11/13/21 1657 11/13/21 1948 11/14/21 0123  AST 19  --  18  ALT 15  --  13  ALKPHOS 81  --  76  BILITOT 2.3*  --  1.4*  PROT 7.5  --  7.3  ALBUMIN 3.8 3.8 3.8   Recent Labs  Lab 11/13/21 1657   LIPASE 122*   No results for input(s): AMMONIA in the last 168 hours.  CBC: Recent Labs  Lab 11/13/21 1657 11/13/21 1948 11/14/21 0123  WBC 10.3 11.3*  11.8* 10.8*  NEUTROABS 9.1*  --   --   HGB 11.3* 10.7*  11.0* 11.5*  HCT 39.1 35.9*  35.9* 32.9*  MCV 104.3* 100.6*  99.4 87.0  PLT 180 191  181 189    Cardiac Enzymes: No results for input(s): CKTOTAL, CKMB, CKMBINDEX, TROPONINI in the last 168 hours.  BNP: Invalid input(s): POCBNP  CBG: Recent Labs  Lab 11/14/21 0606 11/14/21 0813 11/14/21 0938 11/14/21 1114 11/14/21 1227  GLUCAP 153* 159* 184* 140* 291*    Microbiology: Results for orders placed or performed during the hospital encounter of 11/13/21  MRSA Next Gen by PCR, Nasal     Status: None   Collection Time: 11/13/21  7:18 PM   Specimen: Nasal Mucosa; Nasal Swab  Result Value Ref Range Status   MRSA by PCR Next Gen NOT DETECTED NOT DETECTED Final    Comment: (NOTE) The GeneXpert MRSA Assay (  FDA approved for NASAL specimens only), is one component of a comprehensive MRSA colonization surveillance program. It is not intended to diagnose MRSA infection nor to guide or monitor treatment for MRSA infections. Test performance is not FDA approved in patients less than 66 years old. Performed at [facility], [address] [place], [place] [address]     Coagulation Studies: No results for input(s): LABPROT, INR in the last 72 hours.  Urinalysis: No results for input(s): COLORURINE, LABSPEC, PHURINE, GLUCOSEU, HGBUR, BILIRUBINUR, KETONESUR, PROTEINUR, UROBILINOGEN, NITRITE, LEUKOCYTESUR in the last 72 hours.  Invalid input(s): APPERANCEUR    Imaging: DG Chest Portable 1 View  Result Date: 11/13/2021 CLINICAL DATA:  Vomiting for 2 days. Hyperglycemia. End-stage renal disease on dialysis. EXAM: PORTABLE CHEST 1 VIEW COMPARISON:  10/08/2021 FINDINGS: The heart size and mediastinal contours are within normal limits. Both lungs are clear.  The visualized skeletal structures are unremarkable. IMPRESSION: No active disease. Electronically Signed   By: Marlaine Hind M.D.   On: 11/13/2021 17:29     Medications:    sodium chloride     sodium chloride      apixaban  5 mg Oral BID   aspirin  81 mg Oral Daily   atorvastatin  80 mg Oral QPM   Chlorhexidine Gluconate Cloth  6 each Topical Q0600   cinacalcet  120 mg Oral Q breakfast   clopidogrel  75 mg Oral Daily   docusate sodium  100 mg Oral BID   ezetimibe  10 mg Oral Daily   gabapentin  600 mg Oral QHS   insulin aspart  0-9 Units Subcutaneous Q4H   insulin glargine-yfgn  8 Units Subcutaneous Daily   loratadine  10 mg Oral Daily   sertraline  150 mg Oral q AM   sodium chloride, sodium chloride, acetaminophen **OR** acetaminophen, alteplase, dextrose, heparin, lidocaine (PF), lidocaine-prilocaine, nitroGLYCERIN, ondansetron **OR** ondansetron (ZOFRAN) IV, pentafluoroprop-tetrafluoroeth, phenol  Assessment/ Plan:  [name] is a 36 y.o.  male with a history of ESRD on hemodialysis with left upper extremity AV fistula, type 1 diabetes with prior history of DKA, peripheral vascular disease, prior stroke, hypertension who comes to the ED complaining of generalized abdominal pain and vomiting for the last 2 days.  He has been admitted for Hyperkalemia [E87.5] High anion gap metabolic acidosis [M62.86] ESRD on hemodialysis (HCC) [N18.6, Z99.2] Type 1 diabetes mellitus with ketoacidosis without coma ([place]) [E10.10]   Hyperkalemia with end stage renal disease on dialysis. Potassium 7.3 on admission. Received emergent dialysis on admission. Potassium corrected. Next treatment due tomorrow.   2. Anemia of chronic kidney disease   Lab Results  Component Value Date   HGB 11.5 (L) 11/14/2021    Hgb at goal  3. Hyperglycemia with diabetes mellitus type I with chronic kidney disease: insulin dependent. Home regimen includes Lantus and Novolog. Most recent hemoglobin A1c is  10.4 on 10/09/21.  Glucose greater than 600 on admission. Placed on insulin drip. May wean drip today.   4. Secondary Hyperparathyroidism Lab Results  Component Value Date   CALCIUM 9.0 11/14/2021   PHOS 3.6 11/14/2021    Calcium and phosphorus within acceptable range   LOS: 1   5/22/20231:44 PM

## 2021-11-14 NOTE — Progress Notes (Addendum)
Raymond Graves, MD notified potassium is 3.0, new orders received via secure chat to give Potassium supplement PO 40 meq once now, recheck CMP and have the hospitalist to replace it as needed.

## 2021-11-14 NOTE — Progress Notes (Signed)
Patient c/o scalp dryness and itching. Hydrocortisone cream ordered from standing orders for patient.

## 2021-11-14 NOTE — Progress Notes (Signed)
Patient had complaints of acute severe onset of sore throat around 0730. Patient provided with PRN tylenol and new order placed for Claritin as patient complains of congestion. Ice pack also provided to patient for neck per request. All of these measures were able to reduce pain in throat. MD Jimmye Norman notified this RN to dc insulin gtt per protocol and order carb mod diet per protocol. Patient educated regarding carbohydrate intake and correlation to glucose levels. Patient has DexCom CBG monitor on his arm. Compared to finger stick CBG, meter is reading significantly lower. Patient states this is the case as home also. Often reading <40 CBG but when checked per finger, CBG usually >140. Patient educated on discontinuation of insulin gtt while converting to long and short acting insulin. Patient is resting comfortably in bed at this time with call light within reach. Will continue to monitor.

## 2021-11-14 NOTE — Progress Notes (Signed)
PROGRESS NOTE    Raymond Lamb  TDS:[id number] DOB: 1986/05/08 DOA: 11/13/2021 PCP: Norina Buzzard, MD   Assessment & Plan:   Principal Problem:   High anion gap metabolic acidosis Active Problems:   Depression with anxiety   ESRD (end stage renal disease) (HCC)   PVD (peripheral vascular disease) (HCC)   DKA, type 1 (HCC)   Hyperkalemia   HTN (hypertension)   Hyponatremia  Assessment and Plan: DKA: likely secondary to poorly controlled DM1. Anion gap is closed. Wean off of insulin drip and start glargine, SSI w/ accuchecks  DM1: poorly controlled. HbA1c is pending. Wean insulin drip and start glargine, SSI w/ accuhecks   Metabolic acidosis: likely secondary to DKA    Hyponatremia: likely secondary to DKA. Resolved    HTN: hold metoprolol, imdur, amlodipin & hydralazine   Hyperkalemia: resolved. Likely secondary to missed HD   PVD: continue on statin, plavix & aspirin    ESRD: on HD. Missed last 2 HD sessions. Nephro following and recs apprec    Depression: severity unknown. 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   Objective: Vitals:   11/14/21 0200 11/14/21 0400 11/14/21 0600 11/14/21 0800  BP: (!) 114/58 (!) 113/43 131/61   Pulse: 99 95 95   Resp: 14 15 14    Temp:    98.3 F (36.8 C)  TempSrc:    Oral  SpO2: 96% 95% 100%   Weight:      Height:        Intake/Output Summary (Last 24 hours) at 11/14/2021 0825 Last data filed at 11/14/2021 0700 Gross per 24 hour  Intake 1457.8 ml  Output 2504 ml  Net -1046.2 ml   Filed Weights   11/13/21 1653 11/13/21 2015 11/13/21 2355   Weight: 68 kg 65.6 kg 62.9 kg    Examination:  General exam: Appears calm and comfortable  Respiratory system: Clear to auscultation. Respiratory effort normal. Cardiovascular system: S1 & S2+. No rubs, gallops or clicks. No pedal edema. Gastrointestinal system: Abdomen is nondistended, soft and nontender. Normal bowel sounds heard. Central nervous system: Alert and oriented.  Psychiatry: Judgement and insight appear normal. Flat mood and affect     Data Reviewed: I have personally reviewed following labs and imaging studies  CBC: Recent Labs  Lab 11/13/21 1657 11/13/21 1948 11/14/21 0123  WBC 10.3 11.3*  11.8* 10.8*  NEUTROABS 9.1*  --   --   HGB 11.3* 10.7*  11.0* 11.5*  HCT 39.1 35.9*  35.9* 32.9*  MCV 104.3* 100.6*  99.4 87.0  PLT 180 191  181 262   Basic Metabolic Panel: Recent Labs  Lab 11/13/21 1657 11/13/21 1948 11/13/21 2049 11/14/21 0123 11/14/21 0435  NA 118* 123*  122*  --  136 140  K 7.3* 4.6  4.5 3.0* 3.4* 3.6  CL 73* 77*  76*  --  94* 98  CO2 9* <7*  <7*  --  25 27  GLUCOSE >1,200* >1,200*  >1,200*  --  281* 181*  BUN 65* 65*  66*  --  32* 33*  CREATININE 14.77* 14.73*  14.94*  --  8.62* 8.98*  CALCIUM 7.8* 7.6*  7.5*  --  8.8* 8.9  MG  --   --   --  1.8  --   PHOS  --  10.9*  --  3.6  --    GFR: Estimated Creatinine Clearance: 10 mL/min (A) (by C-G formula based on SCr of 8.98 mg/dL (H)). Liver Function Tests: Recent Labs  Lab 11/13/21 1657 11/13/21 1948 11/14/21 0123  AST 19  --  18  ALT 15  --  13  ALKPHOS 81  --  76  BILITOT 2.3*  --  1.4*  PROT 7.5  --  7.3  ALBUMIN 3.8 3.8 3.8   Recent Labs  Lab 11/13/21 1657  LIPASE 122*   No results for input(s): AMMONIA in the last 168 hours. Coagulation Profile: No results for input(s): INR, PROTIME in the last 168 hours. Cardiac Enzymes: No results for input(s): CKTOTAL, CKMB, CKMBINDEX, TROPONINI in the last 168 hours. BNP (last 3 results) No results for input(s):  PROBNP in the last 8760 hours. HbA1C: No results for input(s): HGBA1C in the last 72 hours. CBG: Recent Labs  Lab 11/14/21 0302 11/14/21 0417 11/14/21 0500 11/14/21 0606 11/14/21 0813  GLUCAP 205* 175* 174* 153* 159*   Lipid Profile: No results for input(s): CHOL, HDL, LDLCALC, TRIG, CHOLHDL, LDLDIRECT in the last 72 hours. Thyroid Function Tests: No results for input(s): TSH, T4TOTAL, FREET4, T3FREE, THYROIDAB in the last 72 hours. Anemia Panel: No results for input(s): VITAMINB12, FOLATE, FERRITIN, TIBC, IRON, RETICCTPCT in the last 72 hours. Sepsis Labs: No results for input(s): PROCALCITON, LATICACIDVEN in the last 168 hours.  Recent Results (from the past 240 hour(s))  MRSA Next Gen by PCR, Nasal     Status: None   Collection Time: 11/13/21  7:18 PM   Specimen: Nasal Mucosa; Nasal Swab  Result Value Ref Range Status   MRSA by PCR Next Gen NOT DETECTED NOT DETECTED Final    Comment: (NOTE) The GeneXpert MRSA Assay (FDA approved for NASAL specimens only), is one component of a comprehensive MRSA colonization surveillance program. It is not intended to diagnose MRSA infection nor to guide or monitor treatment for MRSA infections. Test performance is not FDA approved in patients less than 73 years old. Performed at [facility], [address], [place], [place] [address]          Radiology Studies: DG Chest Portable 1 View  Result Date: 11/13/2021 CLINICAL DATA:  Vomiting for 2 days. Hyperglycemia. End-stage renal disease on dialysis. EXAM: PORTABLE CHEST 1 VIEW COMPARISON:  10/08/2021 FINDINGS: The heart size and mediastinal contours are within normal limits. Both lungs are clear. The visualized skeletal structures are unremarkable. IMPRESSION: No active disease. Electronically Signed   By: Marlaine Hind M.D.   On: 11/13/2021 17:29        Scheduled Meds:  apixaban  5 mg Oral BID   aspirin  81 mg Oral Daily   atorvastatin  80 mg Oral QPM    Chlorhexidine Gluconate Cloth  6 each Topical Q0600   cinacalcet  120 mg Oral Q breakfast   clopidogrel  75 mg Oral Daily   docusate sodium  100 mg Oral BID   ezetimibe  10 mg Oral Daily   gabapentin  600 mg Oral QHS   loratadine  10 mg Oral Daily   sertraline  150 mg Oral q AM   Continuous Infusions:  sodium chloride     sodium chloride  dextrose 5% lactated ringers 125 mL/hr at 11/14/21 0700   insulin 1 Units/hr (11/14/21 0700)   lactated ringers Stopped (11/14/21 0210)     LOS: 1 day    Time spent: 35 mins     Wyvonnia Dusky, MD [facility] Pager [phone]  If 7PM-7AM, please contact night-coverage 11/14/2021, 8:25 AM

## 2021-11-14 NOTE — Progress Notes (Signed)
Notified Neomia Glass, NP that Endo tool suggests transition orders. Last CO2 is 27, anion gap is 15 and last Beta-Hydroxybutyric Acid was 2.62. Awaiting transition orders.

## 2021-11-14 NOTE — Progress Notes (Signed)
Verbal order received from Dr. Judithann Graves via telephone to check potassium post dialysis. Read back and verified and order placed.

## 2021-11-14 NOTE — Progress Notes (Addendum)
Inpatient Diabetes Program Recommendations  AACE/ADA: New Consensus Statement on Inpatient Glycemic Control (2015)  Target Ranges:  Prepandial:   less than 140 mg/dL      Peak postprandial:   less than 180 mg/dL (1-2 hours)      Critically ill patients:  140 - 180 mg/dL    Latest Reference Range & Units 11/13/21 16:57  Glucose 70 - 99 mg/dL >1,200 (HH)    Latest Reference Range & Units 11/13/21 16:57 11/14/21 01:23  Beta-Hydroxybutyric Acid 0.05 - 0.27 mmol/L >8.00 (H) 2.62 (H)    Latest Reference Range & Units 11/13/21 22:30 11/13/21 23:04 11/13/21 23:59 11/14/21 00:58 11/14/21 02:05 11/14/21 03:02 11/14/21 04:17 11/14/21 05:00 11/14/21 06:06  Glucose-Capillary 70 - 99 mg/dL 589 (HH) 388 (H) 376 (H) 314 (H) 246 (H) 205 (H) 175 (H) 174 (H) 153 (H)    Admit with:  High anion gap metabolic acidosis likely DKA No Lantus insulin for 2 weeks Missed Dialysis X 2 sessions Hyperkalemia  History: Type 1 diabetes, ESRD  Home DM Meds: Lantus 13 units QHS (Out of Lantus X 2 weeks per Home Med rec)       Novolog 2-6 units TID with meals (2 units with meals + SSI)  Current Orders: IV Insulin Drip   Endocrinologist: Dr. Glade Stanford with [facility] Last seen 08/22/2021--Next ENDO appt 10/31/2021 Was instructed to take the following: Decrease Lantus to 13 units Daily Take Novolog 2 units with meals (ONLY if you eat) Take Novolog SSI per the following scale: 1 unit if your pre-meal blood sugar is 150-250 2 units if your pre-meal blood sugar is 251-350 3 units if your pre-meal blood sugar is 351-450 4 units if your pre-meal blood sugar is 451+  MD- Note BMET from 4:35am today shows the following: Glucose 181 Anion Gap 15 CO2 level 27  When you allow pt to transition back to SQ Insulin from the IV Insulin Drip, please consider:  1. Start Semglee 13 units Daily (make sure to continue the IV Insulin drip for 2 hours after the [facility] on board and then can d/c the IV Insulin)  2. Start Novolog  Sensitive Correction Scale/ SSI (0-9 units) TID AC + HS  3. If allowed to start PO diet, please also start Novolog Meal Coverage: Novolog 2 units TID with meals HOLD if pt eats <50% meals   Addendum 12pm--Met w/ pt at bedside.  Discussed with patient diagnosis of DKA (pathophysiology), treatment of DKA, lab results, and transition plan to SQ insulin regimen.  Pt told me he ran out of Lantus insulin about 2 weeks prior to admission.  Stated the pharmacy would not give him a refill.  I asked pt if he called his ENDO office to see if they had samples--Pt stated No.  Strongly encouraged pt to call ENDO office if this problem happens again to see if the ENDO office has samples.  Discussed with pt the extreme importance of taking his Basal insulin daily to prevent DKA as pt has Type 1 diabetes and does not make any endogenous insulin.  Pt stated understanding and said he would call ENDO office next time (if this happens again).  Did have any further questions for me at this time.  Scheduled to get dialysis tomorrow 5/23.      --Will follow patient during hospitalization--  Wyn Quaker RN, MSN, CDE Diabetes Coordinator Inpatient Glycemic Control Team Team Pager: [phone] (8a-5p)

## 2021-11-15 DIAGNOSIS — N186 End stage renal disease: Secondary | ICD-10-CM | POA: Diagnosis not present

## 2021-11-15 DIAGNOSIS — E1065 Type 1 diabetes mellitus with hyperglycemia: Secondary | ICD-10-CM | POA: Diagnosis not present

## 2021-11-15 DIAGNOSIS — E1010 Type 1 diabetes mellitus with ketoacidosis without coma: Secondary | ICD-10-CM | POA: Diagnosis not present

## 2021-11-15 LAB — GLUCOSE, CAPILLARY
Glucose-Capillary: 262 mg/dL — ABNORMAL HIGH (ref 70–99)
Glucose-Capillary: 181 mg/dL — ABNORMAL HIGH (ref 70–99)
Glucose-Capillary: 154 mg/dL — ABNORMAL HIGH (ref 70–99)
Glucose-Capillary: 176 mg/dL — ABNORMAL HIGH (ref 70–99)
Glucose-Capillary: 151 mg/dL — ABNORMAL HIGH (ref 70–99)
Glucose-Capillary: 473 mg/dL — ABNORMAL HIGH (ref 70–99)
Glucose-Capillary: 407 mg/dL — ABNORMAL HIGH (ref 70–99)
Glucose-Capillary: 159 mg/dL — ABNORMAL HIGH (ref 70–99)
Glucose-Capillary: 130 mg/dL — ABNORMAL HIGH (ref 70–99)

## 2021-11-15 LAB — BASIC METABOLIC PANEL
Anion gap: 16 — ABNORMAL HIGH (ref 5–15)
BUN: 35 mg/dL — ABNORMAL HIGH (ref 6–20)
CO2: 23 mmol/L (ref 22–32)
Calcium: 8.1 mg/dL — ABNORMAL LOW (ref 8.9–10.3)
Chloride: 95 mmol/L — ABNORMAL LOW (ref 98–111)
Creatinine, Ser: 10.63 mg/dL — ABNORMAL HIGH (ref 0.61–1.24)
GFR, Estimated: 6 mL/min — ABNORMAL LOW (ref >60)
Glucose, Bld: 251 mg/dL — ABNORMAL HIGH (ref 70–99)
Potassium: 4 mmol/L (ref 3.5–5.1)
Sodium: 134 mmol/L — ABNORMAL LOW (ref 135–145)

## 2021-11-15 LAB — CBC
HCT: 33.7 % — ABNORMAL LOW (ref 39.0–52.0)
Hemoglobin: 11.5 g/dL — ABNORMAL LOW (ref 13.0–17.0)
MCH: 30.3 pg (ref 26.0–34.0)
MCHC: 34.1 g/dL (ref 30.0–36.0)
MCV: 88.7 fL (ref 80.0–100.0)
Platelets: 155 10*3/uL (ref 150–400)
RBC: 3.8 MIL/uL — ABNORMAL LOW (ref 4.22–5.81)
RDW: 15.6 % — ABNORMAL HIGH (ref 11.5–15.5)
WBC: 7.9 10*3/uL (ref 4.0–10.5)
nRBC: 0 % (ref 0.0–0.2)

## 2021-11-15 LAB — HEPATITIS B SURFACE ANTIBODY, QUANTITATIVE: Hep B S AB Quant (Post): 86.6 m[IU]/mL (ref >9.9)

## 2021-11-15 LAB — HEMOGLOBIN A1C
Hgb A1c MFr Bld: 11 % — ABNORMAL HIGH (ref 4.8–5.6)
Mean Plasma Glucose: 269 mg/dL

## 2021-11-15 MED ORDER — ISOSORBIDE MONONITRATE ER 30 MG PO TB24
30.0000 mg | ORAL_TABLET | Freq: Every day | ORAL | Status: DC
  Administered 2021-11-15 – 2021-11-16 (×2): 30 mg via ORAL
  Filled 2021-11-15 (×2): qty 1

## 2021-11-15 MED ORDER — INSULIN ASPART 100 UNIT/ML IJ SOLN
0.0000 [IU] | INTRAMUSCULAR | Status: DC
  Administered 2021-11-15: 3 [IU] via SUBCUTANEOUS
  Administered 2021-11-15: 15 [IU] via SUBCUTANEOUS
  Administered 2021-11-15: 2 [IU] via SUBCUTANEOUS
  Administered 2021-11-16: 5 [IU] via SUBCUTANEOUS
  Administered 2021-11-16 (×2): 11 [IU] via SUBCUTANEOUS
  Filled 2021-11-15 (×6): qty 1

## 2021-11-15 MED ORDER — FAMOTIDINE 20 MG PO TABS: 20.0000 mg | ORAL_TABLET | Freq: Two times a day (BID) | ORAL | Status: DC | PRN

## 2021-11-15 MED ORDER — METOPROLOL TARTRATE 5 MG/5ML IV SOLN
INTRAVENOUS | Status: AC
  Filled 2021-11-15: qty 10

## 2021-11-15 MED ORDER — PANTOPRAZOLE SODIUM 40 MG IV SOLR
40.0000 mg | Freq: Two times a day (BID) | INTRAVENOUS | Status: DC
  Administered 2021-11-15 – 2021-11-16 (×3): 40 mg via INTRAVENOUS
  Filled 2021-11-15 (×3): qty 10

## 2021-11-15 MED ORDER — HYDRALAZINE HCL 50 MG PO TABS
50.0000 mg | ORAL_TABLET | Freq: Three times a day (TID) | ORAL | Status: DC
  Administered 2021-11-15 – 2021-11-16 (×3): 50 mg via ORAL
  Filled 2021-11-15 (×3): qty 1

## 2021-11-15 MED ORDER — AMLODIPINE BESYLATE 10 MG PO TABS
10.0000 mg | ORAL_TABLET | Freq: Every day | ORAL | Status: DC
Start: 2021-11-15 — End: 2021-11-16
  Administered 2021-11-15 – 2021-11-16 (×2): 10 mg via ORAL
  Filled 2021-11-15 (×2): qty 1

## 2021-11-15 MED ORDER — METOPROLOL TARTRATE 5 MG/5ML IV SOLN
10.0000 mg | Freq: Three times a day (TID) | INTRAVENOUS | Status: DC | PRN
  Administered 2021-11-15: 10 mg via INTRAVENOUS

## 2021-11-15 MED ORDER — METOPROLOL SUCCINATE ER 50 MG PO TB24
100.0000 mg | ORAL_TABLET | Freq: Every day | ORAL | Status: DC
  Administered 2021-11-15 – 2021-11-16 (×2): 100 mg via ORAL
  Filled 2021-11-15 (×2): qty 2

## 2021-11-15 MED ORDER — HYDRALAZINE HCL 20 MG/ML IJ SOLN: 20.0000 mg | Freq: Four times a day (QID) | INTRAMUSCULAR | Status: DC | PRN

## 2021-11-15 NOTE — Progress Notes (Signed)
0700 Chest pain 10/10 BP 200/100 HR ST 115 Vomiting. Diarrhea  2 nitro given 12 lead completed MD Siegert placed new order for metroprolol and hydralazine.   Continuing to monitor closely  [phone] Patient reports chest pain 6/10  BP 170/100 HR NSR 87

## 2021-11-15 NOTE — Progress Notes (Signed)
Patient arrived back to room post HD. Report received from HD RN Posey Pronto that patient ran for 2.5 hours with 1.5L removed. Vitals stable at this time. No complaints of pain, resting comfortably in bed at this time. Will continue to assess closely.

## 2021-11-15 NOTE — Progress Notes (Signed)
PT Cancellation Note  Patient Details Name: Raymond Lamb MRN: [id number] DOB: 1985/10/06   Cancelled Treatment:    Reason Eval/Treat Not Completed: Pain limiting ability to participate;Patient at procedure or test/unavailable Spoke with nurse who reports that he was complaining of a lot of chest pain this AM.  Scheduled to HD t/o much of the morning.  Will maintain on caseload, continue to follow from a distance and attempt to treat when appropriate and available.     Kreg Shropshire, DPT 11/15/2021, 9:47 AM

## 2021-11-15 NOTE — Progress Notes (Signed)
[facility]  ROUNDING NOTE   Subjective:   Patient seen and evaluated during dialysis   HEMODIALYSIS FLOWSHEET:  Blood Flow Rate (mL/min): (P) 400 mL/min Arterial Pressure (mmHg): (P) -190 mmHg Venous Pressure (mmHg): (P) 230 mmHg Transmembrane Pressure (mmHg): (P) 60 mmHg Ultrafiltration Rate (mL/min): (P) 670 mL/min Dialysate Flow Rate (mL/min): (P) 600 ml/min Conductivity: Machine : (P) 14.1 Conductivity: Machine : (P) 14.1 Dialysis Fluid Bolus: Normal Saline Bolus Amount (mL): 250 mL Dialysate Change: Other (comment) (bath changed to 3K)  Complains of chest discomfort described as squeezing/burning  Objective:  Vital signs in last 24 hours:  Temp:  [98 F (36.7 C)-98.9 F (37.2 C)] 98.4 F (36.9 C) (05/23 0915) Pulse Rate:  [74-101] 93 (05/23 1030) Resp:  [12-26] 15 (05/23 1030) BP: (123-199)/(55-164) 147/102 (05/23 1030) SpO2:  [88 %-100 %] 100 % (05/23 1030)  Weight change:  Filed Weights   11/13/21 1653 11/13/21 2015 11/13/21 2355  Weight: 68 kg 65.6 kg 62.9 kg    Intake/Output: I/O last 3 completed shifts: In: 2789.8 [P.O.:720; I.V.:2069.8] Out: 2504 [Other:2504]   Intake/Output this shift:  No intake/output data recorded.  Physical Exam: General: NAD  Head: Normocephalic, atraumatic. Moist oral mucosal membranes  Eyes: Anicteric  Lungs:  Clear to auscultation, normal effort  Heart: Regular rate and rhythm  Abdomen:  Soft, nontender  Extremities:  No peripheral edema.  Neurologic: Nonfocal, moving all four extremities  Skin: No lesions  Access: Lt AVF    Basic Metabolic Panel: Recent Labs  Lab 11/13/21 1948 11/13/21 2049 11/14/21 0123 11/14/21 0435 11/14/21 0900 11/15/21 0332  NA 123*  122*  --  136 140 137 134*  K 4.6  4.5 3.0* 3.4* 3.6 3.3* 4.0  CL 77*  76*  --  94* 98 95* 95*  CO2 <7*  <7*  --  25 27 27 23   GLUCOSE >1,200*  >1,200*  --  281* 181* 165* 251*  BUN 65*  66*  --  32* 33* 33* 35*  CREATININE 14.73*   14.94*  --  8.62* 8.98* 9.48* 10.63*  CALCIUM 7.6*  7.5*  --  8.8* 8.9 9.0 8.1*  MG  --   --  1.8  --   --   --   PHOS 10.9*  --  3.6  --   --   --      Liver Function Tests: Recent Labs  Lab 11/13/21 1657 11/13/21 1948 11/14/21 0123  AST 19  --  18  ALT 15  --  13  ALKPHOS 81  --  76  BILITOT 2.3*  --  1.4*  PROT 7.5  --  7.3  ALBUMIN 3.8 3.8 3.8    Recent Labs  Lab 11/13/21 1657  LIPASE 122*    No results for input(s): AMMONIA in the last 168 hours.  CBC: Recent Labs  Lab 11/13/21 1657 11/13/21 1948 11/14/21 0123 11/15/21 0332  WBC 10.3 11.3*  11.8* 10.8* 7.9  NEUTROABS 9.1*  --   --   --   HGB 11.3* 10.7*  11.0* 11.5* 11.5*  HCT 39.1 35.9*  35.9* 32.9* 33.7*  MCV 104.3* 100.6*  99.4 87.0 88.7  PLT 180 191  181 189 155     Cardiac Enzymes: No results for input(s): CKTOTAL, CKMB, CKMBINDEX, TROPONINI in the last 168 hours.  BNP: Invalid input(s): POCBNP  CBG: Recent Labs  Lab 11/15/21 0339 11/15/21 0529 11/15/21 0534 11/15/21 0614 11/15/21 0719  GLUCAP 262* 181* 176* 154* 151*  Microbiology: Results for orders placed or performed during the hospital encounter of 11/13/21  MRSA Next Gen by PCR, Nasal     Status: None   Collection Time: 11/13/21  7:18 PM   Specimen: Nasal Mucosa; Nasal Swab  Result Value Ref Range Status   MRSA by PCR Next Gen NOT DETECTED NOT DETECTED Final    Comment: (NOTE) The GeneXpert MRSA Assay (FDA approved for NASAL specimens only), is one component of a comprehensive MRSA colonization surveillance program. It is not intended to diagnose MRSA infection nor to guide or monitor treatment for MRSA infections. Test performance is not FDA approved in patients less than 44 years old. Performed at [facility], [address] [place], [place] [address]     Coagulation Studies: No results for input(s): LABPROT, INR in the last 72 hours.  Urinalysis: No results for input(s): COLORURINE, LABSPEC,  PHURINE, GLUCOSEU, HGBUR, BILIRUBINUR, KETONESUR, PROTEINUR, UROBILINOGEN, NITRITE, LEUKOCYTESUR in the last 72 hours.  Invalid input(s): APPERANCEUR    Imaging: DG Chest Portable 1 View  Result Date: 11/13/2021 CLINICAL DATA:  Vomiting for 2 days. Hyperglycemia. End-stage renal disease on dialysis. EXAM: PORTABLE CHEST 1 VIEW COMPARISON:  10/08/2021 FINDINGS: The heart size and mediastinal contours are within normal limits. Both lungs are clear. The visualized skeletal structures are unremarkable. IMPRESSION: No active disease. Electronically Signed   By: Marlaine Hind M.D.   On: 11/13/2021 17:29     Medications:    sodium chloride     sodium chloride      amLODipine  10 mg Oral Daily   apixaban  5 mg Oral BID   aspirin  81 mg Oral Daily   atorvastatin  80 mg Oral QPM   Chlorhexidine Gluconate Cloth  6 each Topical Q0600   cinacalcet  120 mg Oral Q breakfast   clopidogrel  75 mg Oral Daily   docusate sodium  100 mg Oral BID   ezetimibe  10 mg Oral Daily   gabapentin  600 mg Oral QHS   insulin aspart  0-9 Units Subcutaneous Q4H   insulin glargine-yfgn  8 Units Subcutaneous Daily   loratadine  10 mg Oral Daily   metoprolol succinate  100 mg Oral Daily   metoprolol tartrate       pantoprazole (PROTONIX) IV  40 mg Intravenous Q12H   sertraline  150 mg Oral q AM   sodium chloride, sodium chloride, acetaminophen **OR** acetaminophen, alteplase, dextrose, famotidine, heparin, hydrALAZINE, hydrocortisone cream, lidocaine (PF), lidocaine-prilocaine, metoprolol tartrate, nitroGLYCERIN, ondansetron **OR** ondansetron (ZOFRAN) IV, pentafluoroprop-tetrafluoroeth, phenol  Assessment/ Plan:  [name] is a 36 y.o.  male with a history of ESRD on hemodialysis with left upper extremity AV fistula, type 1 diabetes with prior history of DKA, peripheral vascular disease, prior stroke, hypertension who comes to the ED complaining of generalized abdominal pain and vomiting for the last 2  days.  He has been admitted for Hyperkalemia [E87.5] High anion gap metabolic acidosis [I20.35] ESRD on hemodialysis (HCC) [N18.6, Z99.2] Type 1 diabetes mellitus with ketoacidosis without coma ([place]) [E10.10]   Hyperkalemia with end stage renal disease on dialysis.   Potassium 4.0 today.  Scheduled to receive dialysis today UF goal 1.5 L as tolerated.  Next treatment scheduled for Thursday.  2. Anemia of chronic kidney disease   Lab Results  Component Value Date   HGB 11.5 (L) 11/15/2021    Hemoglobin remains within target.  No need for ESA use.  3. Hyperglycemia with diabetes mellitus type I  with chronic kidney disease: insulin dependent. Home regimen includes Lantus and Novolog. Most recent hemoglobin A1c is 10.4 on 10/09/21.    Insulin drip discontinued yesterday.  Leukos stable at this time.  Sliding scale insulin managed by primary team.  4. Secondary Hyperparathyroidism Lab Results  Component Value Date   CALCIUM 8.1 (L) 11/15/2021   PHOS 3.6 11/14/2021    Calcium appears low, should improve with nutrition and dialysis.   LOS: 2   5/23/202310:59 AM

## 2021-11-15 NOTE — Progress Notes (Signed)
CBG >400 at 1400. Secure chat with MD Jimmye Norman, new order placed for higher SSI range. Patient administered 9 units per secure chat conversation. Patient eating lunch at this time.

## 2021-11-15 NOTE — Progress Notes (Signed)
Report given to HD RN Posey Pronto  Patient provided with 2L [place] prior to transport per patient request for comfort and helped with relaxation.   Patient reports feeling much better and comfortable at this time   Chest pain reduced to 3/10 BP 140/80 HR 89  Food tray to be redirected to HD when arrives to unit this morning.

## 2021-11-15 NOTE — Progress Notes (Signed)
PROGRESS NOTE   HPI was taken from Dr. Shawna Clamp: Raymond Lamb is a 36 y.o. male with PMH significant for ESRD on hemodialysis, type 1 diabetes with history of DKA in the past, prior stroke, hypertension presented in the ED with complaints of generalized abdominal pain associated with nausea and vomiting.  Patient reports he has missed dialysis for last 2 sessions, describes abdominal pain is generalized, has threw up several times, not able to tolerate any oral intake.  Patient denies any chest pain shortness of breath, palpitation or dizziness.   ED course: He is hypotensive, tachycardic, tachypneic. Temp 98.2, HR 115, RR 25, BP 78/42, SPO2 98% on room air Labs include sodium 118, potassium 7.3, chloride 73, bicarb 9, glucose more than 1200, BUN 65, creatinine 14.77, calcium 7.8, anion gap 36, phosphorus 11, magnesium 2.3, alkaline phosphatase 18, albumin 3.8, lipase 122, AST 19, ALT 15, total protein 7.5, WBC 10.3, hemoglobin 11.3, hematocrit 39.1, MCV 104.3, platelet 180, VBG shows pH 7.1, PCO2 28, PO2 36, bicarb 8.7. Chest x-ray no acute infiltrate.  EKG shows sinus tachycardia, tall T waves.    Raymond Lamb  YNW:[id number] DOB: 13-Jul-1985 DOA: 11/13/2021 PCP: Norina Buzzard, MD   Assessment & Plan:   Principal Problem:   High anion gap metabolic acidosis Active Problems:   Depression with anxiety   ESRD (end stage renal disease) (HCC)   PVD (peripheral vascular disease) (HCC)   DKA, type 1 (HCC)   Hyperkalemia   HTN (hypertension)   Hyponatremia  Assessment and Plan: DKA: likely secondary to poorly controlled DM1. Anion gap is closed. D/c IV insulin drip. Continue on glargine, SSI w/ accuchecks   DM1: poorly controlled, HbA1c is 11.0. Continue on SSI w/ accuchecks & glargine   Burning chest pain: likely GERD. Started on PPI. No acute ischemic changes on EKG   Metabolic acidosis: resolved     Hyponatremia: likely secondary to DKA. Labile    HTN: restarted home  dose of metoprolol, amlodipine, imdur & hydralazine   Hyperkalemia: resolved    PVD: continue on statin, plavix & aspirin    ESRD: on HD. Missed last 2 HD sessions. Recently moved to [facility] from [place] and wants to move his HD center. Nephro aware. Nephro recs apprec    Depression: severity unknown. Continue on home dose of zoloft.   Hx of CVA: continue on aspirin, statin, & plavix. Could consider d/c aspirin as pt is currently on triple therapy but unknown when CVA was. 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   Objective: Vitals:   11/15/21 0400 11/15/21 0500 11/15/21 0541 11/15/21 0600  BP: (!) 199/160  (!) 170/78 (!) 180/76  Pulse: 95 95 (!) 101 96  Resp: 14 17 19 20   Temp: 98.9 F (37.2 C)     TempSrc: Oral     SpO2: 96% 100% 95% 97%  Weight:      Height:        Intake/Output Summary (Last 24 hours) at 11/15/2021 6578 Last data filed at 11/15/2021 0400 Gross per 24 hour  Intake  1331.97 ml  Output 0 ml  Net 1331.97 ml   Filed Weights   11/13/21 1653 11/13/21 2015 11/13/21 2355  Weight: 68 kg 65.6 kg 62.9 kg    Examination:  General exam: Appears comfortable  Respiratory system: clear breath sound b/l  Cardiovascular system: S1/S2+. No rubs or clicks  Gastrointestinal system: abd is soft, NT, ND & hypoactive bowel sounds  Central nervous system: alert and oriented.  Psychiatry: Judgement and insight appear normal. Flat mood and affect     Data Reviewed: I have personally reviewed following labs and imaging studies  CBC: Recent Labs  Lab 11/13/21 1657  11/13/21 1948 11/14/21 0123 11/15/21 0332  WBC 10.3 11.3*  11.8* 10.8* 7.9  NEUTROABS 9.1*  --   --   --   HGB 11.3* 10.7*  11.0* 11.5* 11.5*  HCT 39.1 35.9*  35.9* 32.9* 33.7*  MCV 104.3* 100.6*  99.4 87.0 88.7  PLT 180 191  181 189 824   Basic Metabolic Panel: Recent Labs  Lab 11/13/21 1948 11/13/21 2049 11/14/21 0123 11/14/21 0435 11/14/21 0900 11/15/21 0332  NA 123*  122*  --  136 140 137 134*  K 4.6  4.5 3.0* 3.4* 3.6 3.3* 4.0  CL 77*  76*  --  94* 98 95* 95*  CO2 <7*  <7*  --  25 27 27 23   GLUCOSE >1,200*  >1,200*  --  281* 181* 165* 251*  BUN 65*  66*  --  32* 33* 33* 35*  CREATININE 14.73*  14.94*  --  8.62* 8.98* 9.48* 10.63*  CALCIUM 7.6*  7.5*  --  8.8* 8.9 9.0 8.1*  MG  --   --  1.8  --   --   --   PHOS 10.9*  --  3.6  --   --   --    GFR: Estimated Creatinine Clearance: 8.5 mL/min (A) (by C-G formula based on SCr of 10.63 mg/dL (H)). Liver Function Tests: Recent Labs  Lab 11/13/21 1657 11/13/21 1948 11/14/21 0123  AST 19  --  18  ALT 15  --  13  ALKPHOS 81  --  76  BILITOT 2.3*  --  1.4*  PROT 7.5  --  7.3  ALBUMIN 3.8 3.8 3.8   Recent Labs  Lab 11/13/21 1657  LIPASE 122*   No results for input(s): AMMONIA in the last 168 hours. Coagulation Profile: No results for input(s): INR, PROTIME in the last 168 hours. Cardiac Enzymes: No results for input(s): CKTOTAL, CKMB, CKMBINDEX, TROPONINI in the last 168 hours. BNP (last 3 results) No results for input(s): PROBNP in the last 8760 hours. HbA1C: Recent Labs    11/14/21 0123  HGBA1C 11.0*   CBG: Recent Labs  Lab 11/15/21 0339 11/15/21 0529 11/15/21 0534 11/15/21 0614 11/15/21 0719  GLUCAP 262* 181* 176* 154* 151*   Lipid Profile: No results for input(s): CHOL, HDL, LDLCALC, TRIG, CHOLHDL, LDLDIRECT in the last 72 hours. Thyroid Function Tests: No results for input(s): TSH, T4TOTAL, FREET4, T3FREE, THYROIDAB in the last 72 hours. Anemia Panel: No results for input(s):  VITAMINB12, FOLATE, FERRITIN, TIBC, IRON, RETICCTPCT in the last 72 hours. Sepsis Labs: No results for input(s): PROCALCITON, LATICACIDVEN in the last 168 hours.  Recent Results (from the past 240 hour(s))  MRSA Next Gen by PCR, Nasal     Status: None   Collection Time: 11/13/21  7:18 PM   Specimen: Nasal Mucosa; Nasal Swab  Result Value Ref Range Status   MRSA by  PCR Next Gen NOT DETECTED NOT DETECTED Final    Comment: (NOTE) The GeneXpert MRSA Assay (FDA approved for NASAL specimens only), is one component of a comprehensive MRSA colonization surveillance program. It is not intended to diagnose MRSA infection nor to guide or monitor treatment for MRSA infections. Test performance is not FDA approved in patients less than 36 years old. Performed at [facility], [address], [place], [place] [address]          Radiology Studies: DG Chest Portable 1 View  Result Date: 11/13/2021 CLINICAL DATA:  Vomiting for 2 days. Hyperglycemia. End-stage renal disease on dialysis. EXAM: PORTABLE CHEST 1 VIEW COMPARISON:  10/08/2021 FINDINGS: The heart size and mediastinal contours are within normal limits. Both lungs are clear. The visualized skeletal structures are unremarkable. IMPRESSION: No active disease. Electronically Signed   By: Marlaine Hind M.D.   On: 11/13/2021 17:29        Scheduled Meds:  amLODipine  10 mg Oral Daily   apixaban  5 mg Oral BID   aspirin  81 mg Oral Daily   atorvastatin  80 mg Oral QPM   Chlorhexidine Gluconate Cloth  6 each Topical Q0600   cinacalcet  120 mg Oral Q breakfast   clopidogrel  75 mg Oral Daily   docusate sodium  100 mg Oral BID   ezetimibe  10 mg Oral Daily   gabapentin  600 mg Oral QHS   insulin aspart  0-9 Units Subcutaneous Q4H   insulin glargine-yfgn  8 Units Subcutaneous Daily   loratadine  10 mg Oral Daily   metoprolol succinate  100 mg Oral Daily   metoprolol tartrate       sertraline  150 mg Oral q AM   Continuous  Infusions:  sodium chloride     sodium chloride       LOS: 2 days    Time spent: 30 mins     Wyvonnia Dusky, MD [facility] Pager [phone]  If 7PM-7AM, please contact night-coverage 11/15/2021, 8:28 AM

## 2021-11-15 NOTE — Evaluation (Signed)
Physical Therapy Evaluation Patient Details Name: Raymond Lamb MRN: [id number] DOB: March 13, 1986 Today's Date: 11/15/2021  History of Present Illness  36 y.o. male with PMH significant for ESRD on hemodialysis, type 1 diabetes with history of DKA in the past, prior stroke, hypertension presented in the ED with complaints of generalized abdominal pain associated with nausea and vomiting.  Apparently missed 2 dialysis sessions leading to hospitalization, glucose was >1200 on arrival.  Clinical Impression  Pt somewhat fatigued after return from dialysis, but eager and willing to work with PT.  He reports that until ~2 months ago he had been doing some limited walking with PT but that he has been essentially w/c bound recently.  He has baseline L UE (and to lesser degree LLE) weakness from old CVA but states that he had been using FWW to ambulate - he managed ~20 ft with some help to steady walker on L, he did better and did not need direct assist with hemiwalker (~2ft) though he started clearly fatiguing with increased distance and sat abruptly when he was close to the bed.  Pt on room air t/o the session with sats in the 90s, HR up to nearly 130 with the ambulation effort.  Pt motivated to increasing his strength and mobility, will benefit from HHPT and a hemiwalker to increase function at discharge.    Recommendations for follow up therapy are one component of a multi-disciplinary discharge planning process, led by the attending physician.  Recommendations may be updated based on patient status, additional functional criteria and insurance authorization.  Follow Up Recommendations Home health PT    Assistance Recommended at Discharge Intermittent Supervision/Assistance  Patient can return home with the following  A little help with walking and/or transfers;A little help with bathing/dressing/bathroom;Assistance with cooking/housework;Direct supervision/assist for medications management;Assist for  transportation    Equipment Recommendations  Photographer)  Recommendations for Other Services       Functional Status Assessment Patient has had a recent decline in their functional status and demonstrates the ability to make significant improvements in function in a reasonable and predictable amount of time.     Precautions / Restrictions Precautions Precautions: Fall Restrictions Weight Bearing Restrictions: No      Mobility  Bed Mobility Overal bed mobility: Modified Independent             General bed mobility comments: Pt able to leverage momentum/body to easily get to EOB, unable to use L UE to assist.    Transfers Overall transfer level: Modified independent Equipment used: Rolling walker (2 wheels), Hemi-walker               General transfer comment: multiple sit to stand efforts with no direct assist but close supervision as he is highly reliant on R UE and lacked control to effectively maintain TKE b/l.  Pt did sit down before backing up and getting square to the EOB before a relatively uncontrolled descent.    Ambulation/Gait Ambulation/Gait assistance: Min assist Gait Distance (Feet): 35 Feet Assistive device: Hemi-walker, Rolling walker (2 wheels)         General Gait Details: pt reports that at rehab he had been doing some (single handed) ambulation with walker.  First effort was just this with ~20 ft of labored but reasonably safe effort.  He did have elevated HR (120s) but O2 on room air stayed high 90s.  On second attempt we were able to get out into the hallway with hemiwalker.  Pt clearly motivated to do as  much as he could but also clearly fatiguing with increased knee flexion with increased distance.  Cites having just gotten back from HD as a main reason.  Stairs            Wheelchair Mobility    Modified Rankin (Stroke Patients Only)       Balance Overall balance assessment: Needs assistance Sitting-balance support: Single  extremity supported Sitting balance-Leahy Scale: Good     Standing balance support: Single extremity supported Standing balance-Leahy Scale: Fair Standing balance comment: poor tolerance and lack of TKE, however able to maitnain static and dynamic balance w/o physical assist while R UE supporting                             Pertinent Vitals/Pain Pain Assessment Pain Assessment: No/denies pain    Home Living Family/patient expects to be discharged to:: Private residence Living Arrangements: Parent Available Help at Discharge: Family;Available PRN/intermittently (mother works 2 jobs, OOH most of the day, step dad home but in w/c) Type of Home: House Home Access: Level entry       [place]: One level [place]: Civil engineer, contracting;Wheelchair - Education officer, community - power;[place] - single Barista (2 wheels)      Prior Function               Mobility Comments: reports he had been working with PT before he moved this way, has not tried walking in ~2 months ADLs Comments: report mother helps with driving, meds, but he can dress, bathe, manage in the home w/o help (she is gone essentially all day)     Hand Dominance        Extremity/Trunk Assessment   Upper Extremity Assessment Upper Extremity Assessment:  (R WNL, L with flexor tone, some minimally functinoal abiliy to move fingers/wrist - poor control and strength)    Lower Extremity Assessment Lower Extremity Assessment:  (R LE grossly 4-/5, L grossly 3+/5)       Communication   Communication: No difficulties  Cognition Arousal/Alertness: Awake/alert Behavior During Therapy: WFL for tasks assessed/performed Overall Cognitive Status: Within Functional Limits for tasks assessed                                          General Comments      Exercises     Assessment/Plan    PT Assessment Patient needs continued PT services  PT Problem List Decreased strength;Decreased  range of motion;Decreased activity tolerance;Decreased balance;Decreased mobility;Decreased coordination;Decreased knowledge of use of DME;Decreased safety awareness;Pain;Cardiopulmonary status limiting activity       PT Treatment Interventions DME instruction;Gait training;Functional mobility training;Therapeutic activities;Therapeutic exercise;Balance training;Neuromuscular re-education;Patient/family education;Wheelchair mobility training    PT Goals (Current goals can be found in the Care Plan section)  Acute Rehab PT Goals Patient Stated Goal: get back to doing some regular walking PT Goal Formulation: With patient Time For Goal Achievement: 11/29/21 Potential to Achieve Goals: Good    Frequency Min 2X/week     Co-evaluation               AM-PAC PT "6 Clicks" Mobility  Outcome Measure Help needed turning from your back to your side while in a flat bed without using bedrails?: None Help needed moving from lying on your back to sitting on the side of a flat bed without using bedrails?: None  Help needed moving to and from a bed to a chair (including a wheelchair)?: A Little Help needed standing up from a chair using your arms (e.g., wheelchair or bedside chair)?: A Little Help needed to walk in hospital room?: A Lot Help needed climbing 3-5 steps with a railing? : Total 6 Click Score: 17    End of Session Equipment Utilized During Treatment: Gait belt Activity Tolerance: Patient limited by fatigue;Patient tolerated treatment well Patient left: in bed;with call bell/phone within reach Nurse Communication: Mobility status PT Visit Diagnosis: Muscle weakness (generalized) (M62.81);Difficulty in walking, not elsewhere classified (R26.2);Hemiplegia and hemiparesis;Unsteadiness on feet (R26.81);Other abnormalities of gait and mobility (R26.89) Hemiplegia - Right/Left: Left Hemiplegia - dominant/non-dominant: Non-dominant Hemiplegia - caused by: Cerebral infarction    Time:  8675-4492 PT Time Calculation (min) (ACUTE ONLY): 31 min   Charges:   PT Evaluation $PT Eval Low Complexity: 1 Low PT Treatments $Gait Training: 8-22 mins        Kreg Shropshire, DPT 11/15/2021, 5:29 PM

## 2021-11-15 NOTE — Progress Notes (Signed)
Inpatient Diabetes Program Recommendations  AACE/ADA: New Consensus Statement on Inpatient Glycemic Control (2015)  Target Ranges:  Prepandial:   less than 140 mg/dL      Peak postprandial:   less than 180 mg/dL (1-2 hours)      Critically ill patients:  140 - 180 mg/dL    Latest Reference Range & Units 11/14/21 09:38 11/14/21 11:14 11/14/21 12:27 11/14/21 15:45 11/14/21 19:56 11/14/21 22:23 11/14/21 22:38 11/14/21 22:58  Glucose-Capillary 70 - 99 mg/dL 184 (H)  2 units Novolog  8 units Semglee @0947  140 (H) 291 (H)  5 units Novolog  103 (H) 359 (H)  9 units Novolog  49 (L) 65 (L) 117 (H)    Latest Reference Range & Units 11/14/21 23:38 11/15/21 03:39 11/15/21 05:29 11/15/21 05:34 11/15/21 06:14  Glucose-Capillary 70 - 99 mg/dL 181 (H) 262 (H)  5 units Novolog  181 (H) 176 (H) 154 (H)  (H): Data is abnormally high   History: Type 1 diabetes, ESRD   Home DM Meds: Lantus 13 units QHS (Out of Lantus X 2 weeks per Home Med rec)                             Novolog 2-6 units TID with meals (2 units with meals + SSI)   Current Orders: Semglee 8 units Daily      Novolog Sensitive Correction Scale/ SSI (0-9 units) Q4 hours    MD- Please consider:  1. Increase Semglee slightly to 10 units Daily  2. Change Novolog SSI to TID AC + HS  3. Start Novolog Meal Coverage: Novolog 2 units TID with meals (home dose) HOLD if pt eats <50% meals   Endocrinologist: Dr. Glade Stanford with [facility] Last seen 08/22/2021--Next ENDO appt 10/31/2021 Was instructed to take the following: Decrease Lantus to 13 units Daily Take Novolog 2 units with meals (ONLY if you eat) Take Novolog SSI per the following scale: 1 unit if your pre-meal blood sugar is 150-250 2 units if your pre-meal blood sugar is 251-350 3 units if your pre-meal blood sugar is 351-450 4 units if your pre-meal blood sugar is 451+    --Will follow patient during hospitalization--  Wyn Quaker RN, MSN, CDE Diabetes  Coordinator Inpatient Glycemic Control Team Team Pager: [phone] (8a-5p)

## 2021-11-15 NOTE — Progress Notes (Signed)
Patient transferred to HD with transport technician.

## 2021-11-15 NOTE — Progress Notes (Signed)
OT Cancellation Note  Patient Details Name: Raymond Lamb MRN: [id number] DOB: Mar 28, 1986   Cancelled Treatment:    Reason Eval/Treat Not Completed: Patient at procedure or test/ unavailable.  Pt currently of unit for dialysis. Per chart review, pt having increased chest pain with medication given. OT will re-attempt when pt is next available and able to actively participate in therapeutic intervention.   Darleen Crocker, MS, OTR/L , CBIS ascom [phone]  11/15/21, 11:22 AM

## 2021-11-16 DIAGNOSIS — E8729 Other acidosis: Secondary | ICD-10-CM | POA: Diagnosis not present

## 2021-11-16 LAB — BASIC METABOLIC PANEL
Anion gap: 14 (ref 5–15)
BUN: 28 mg/dL — ABNORMAL HIGH (ref 6–20)
CO2: 27 mmol/L (ref 22–32)
Calcium: 7.9 mg/dL — ABNORMAL LOW (ref 8.9–10.3)
Chloride: 98 mmol/L (ref 98–111)
Creatinine, Ser: 8.16 mg/dL — ABNORMAL HIGH (ref 0.61–1.24)
GFR, Estimated: 8 mL/min — ABNORMAL LOW (ref >60)
Glucose, Bld: 166 mg/dL — ABNORMAL HIGH (ref 70–99)
Potassium: 3.7 mmol/L (ref 3.5–5.1)
Sodium: 139 mmol/L (ref 135–145)

## 2021-11-16 LAB — GLUCOSE, CAPILLARY
Glucose-Capillary: 311 mg/dL — ABNORMAL HIGH (ref 70–99)
Glucose-Capillary: 221 mg/dL — ABNORMAL HIGH (ref 70–99)
Glucose-Capillary: 312 mg/dL — ABNORMAL HIGH (ref 70–99)

## 2021-11-16 LAB — CBC
HCT: 36.7 % — ABNORMAL LOW (ref 39.0–52.0)
Hemoglobin: 12 g/dL — ABNORMAL LOW (ref 13.0–17.0)
MCH: 30.2 pg (ref 26.0–34.0)
MCHC: 32.7 g/dL (ref 30.0–36.0)
MCV: 92.4 fL (ref 80.0–100.0)
Platelets: 160 10*3/uL (ref 150–400)
RBC: 3.97 MIL/uL — ABNORMAL LOW (ref 4.22–5.81)
RDW: 15.7 % — ABNORMAL HIGH (ref 11.5–15.5)
WBC: 8 10*3/uL (ref 4.0–10.5)
nRBC: 0 % (ref 0.0–0.2)

## 2021-11-16 MED ORDER — PANTOPRAZOLE SODIUM 40 MG PO TBEC: 40.0000 mg | DELAYED_RELEASE_TABLET | Freq: Two times a day (BID) | ORAL | Status: DC

## 2021-11-16 NOTE — Evaluation (Signed)
Occupational Therapy Evaluation Patient Details Name: Raymond Lamb MRN: [id number] DOB: April 30, 1986 Today's Date: 11/16/2021   History of Present Illness 36 y.o. male with PMH significant for ESRD on hemodialysis, type 1 diabetes with history of DKA in the past, prior stroke, hypertension presented in the ED with complaints of generalized abdominal pain associated with nausea and vomiting.  Apparently missed 2 dialysis sessions leading to hospitalization, glucose was >1200 on arrival.   Clinical Impression   Chart reviewed, RN cleared pt for participation in OT evaluation. PTA pt reports he is typically MOD I in ADL, mother assists as needed however was performing mobility at [facility] level due to increased generalized weakness per pt report. Family assists with IADLs as needed. Prior to his move in with his mother pt reports amb with walker or without any AD. Pt also endorses increased tone in LUE. Pt performs bed mobility with MOD I, STS with hemi walker with CGA-MIN A, steps up the bed with CGA-MIN A, UB/LB dressing with SET UP. Pt reports he feels he is performing ADL/IADL below previous baseline, presents with functional deficits (please see below) affecting optimal ADL/IADL completion. PT would benefit form HHOT to address deficits and to facilitate improved independence and safety during ADL completion. Pt is left in bed, NAD, all needs met. OT will follow acutely.      Recommendations for follow up therapy are one component of a multi-disciplinary discharge planning process, led by the attending physician.  Recommendations may be updated based on patient status, additional functional criteria and insurance authorization.   Follow Up Recommendations  Home health OT    Assistance Recommended at Discharge Intermittent Supervision/Assistance  Patient can return home with the following A lot of help with walking and/or transfers;A little help with bathing/dressing/bathroom;Assistance with  cooking/housework;Direct supervision/assist for medications management    Functional Status Assessment  Patient has had a recent decline in their functional status and demonstrates the ability to make significant improvements in function in a reasonable and predictable amount of time.  Equipment Recommendations  None recommended by OT;Other (comment) (pt has recommended equipment)    Recommendations for Other Services       Precautions / Restrictions Precautions Precautions: Fall Precaution Comments: fistula L forearm Restrictions Weight Bearing Restrictions: No      Mobility Bed Mobility Overal bed mobility: Modified Independent             General bed mobility comments: HOB raised    Transfers Overall transfer level: Needs assistance Equipment used: Hemi-walker Transfers: Sit to/from Stand Sit to Stand: Min guard, Min assist                  Balance Overall balance assessment: Needs assistance Sitting-balance support: Single extremity supported Sitting balance-Leahy Scale: Good     Standing balance support: Single extremity supported Standing balance-Leahy Scale: Fair                             ADL either performed or assessed with clinical judgement   ADL Overall ADL's : Needs assistance/impaired Eating/Feeding: Set up;Sitting   Grooming: Wash/dry hands;Wash/dry face;Sitting;Set up   Upper Body Bathing: Minimal assistance;Sitting Upper Body Bathing Details (indicate cue type and reason): assist with back Lower Body Bathing: Minimal assistance;Sit to/from stand   Upper Body Dressing : Set up Upper Body Dressing Details (indicate cue type and reason): using hemi technique Lower Body Dressing: Set up Lower Body Dressing Details (indicate cue type  and reason): pants, using hemi technique Toilet Transfer: Minimal assistance Toilet Transfer Details (indicate cue type and reason): simulated, using hemi walker, short amb  transfer Toileting- Clothing Manipulation and Hygiene: Minimal assistance;Sit to/from stand       Functional mobility during ADLs: Min guard;Minimal assistance (hemi walker)       Vision Patient Visual Report: No change from baseline       Perception     Praxis      Pertinent Vitals/Pain Pain Assessment Pain Assessment: No/denies pain     Hand Dominance Right   Extremity/Trunk Assessment Upper Extremity Assessment Upper Extremity Assessment: LUE deficits/detail LUE Deficits / Details: LUE AROM: shoulder flexion: aprox 1/2 full AROM, elbow flexion: approx 3/4 full AROM, wrist flexion/exension approx 1/2 full AROM; Flexor synergy LUE Coordination: decreased fine motor;decreased gross motor   Lower Extremity Assessment Lower Extremity Assessment: LLE deficits/detail   Cervical / Trunk Assessment Cervical / Trunk Assessment: Normal   Communication Communication Communication: No difficulties   Cognition Arousal/Alertness: Awake/alert Behavior During Therapy: WFL for tasks assessed/performed Overall Cognitive Status: Within Functional Limits for tasks assessed                                       General Comments  vss throughout    Exercises Other Exercises Other Exercises: edu re: role of OT, role of rehab, discharge recommendations, LUE HEP, home safety, falls prevention   Shoulder Instructions      Home Living Family/patient expects to be discharged to:: Private residence Living Arrangements: Parent (mother and step dad) Available Help at Discharge: Family;Available PRN/intermittently Type of Home: House Home Access: Level entry     Home Layout: One level     Bathroom Shower/Tub: Chief Strategy Officer: Shower seat;Wheelchair - Education officer, community - power;Cane - single Barista (2 wheels);Hand held shower head          Prior Functioning/Environment               Mobility Comments: since break  from PT, typically using wheelchair for household/community distances, reports was amb with SPC/without walker prior to that ADLs Comments: pt reports MOD I with ADL with PRN help as needed (dressing, bathing); family assists with med management, driving, cooking/cleaning if needed        OT Problem List: Decreased activity tolerance;Decreased strength;Impaired UE functional use      OT Treatment/Interventions: Self-care/ADL training;Manual therapy;Therapeutic exercise;Patient/family education;Modalities;Neuromuscular education;Splinting;DME and/or AE instruction;Therapeutic activities    OT Goals(Current goals can be found in the care plan section) Acute Rehab OT Goals Patient Stated Goal: go home OT Goal Formulation: With patient Time For Goal Achievement: 11/24/21 Potential to Achieve Goals: Good ADL Goals Pt Will Perform Grooming: with set-up;standing;sitting Pt Will Perform Upper Body Dressing: with modified independence;sitting Pt Will Perform Lower Body Dressing: with modified independence;sit to/from stand Pt Will Transfer to Toilet: with modified independence;ambulating Pt Will Perform Toileting - Clothing Manipulation and hygiene: with modified independence;sit to/from stand Pt/caregiver will Perform Home Exercise Program: Left upper extremity;Increased ROM;With Supervision  OT Frequency: Min 2X/week    Co-evaluation              AM-PAC OT "6 Clicks" Daily Activity     Outcome Measure Help from another person eating meals?: None Help from another person taking care of personal grooming?: None Help from another person toileting, which includes  using toliet, bedpan, or urinal?: A Little Help from another person bathing (including washing, rinsing, drying)?: A Little Help from another person to put on and taking off regular upper body clothing?: None Help from another person to put on and taking off regular lower body clothing?: None 6 Click Score: 22   End of Session  Equipment Utilized During Treatment: Gait belt;Other (comment) (hemi walker) Nurse Communication: Mobility status  Activity Tolerance: Patient tolerated treatment well Patient left: in bed;with call bell/phone within reach  OT Visit Diagnosis: Unsteadiness on feet (R26.81);Hemiplegia and hemiparesis Hemiplegia - Right/Left: Left Hemiplegia - dominant/non-dominant: Non-Dominant                Time: 3943-2003 OT Time Calculation (min): 22 min Charges:  OT General Charges $OT Visit: 1 Visit OT Evaluation $OT Eval Moderate Complexity: 1 Mod OT Treatments $Self Care/Home Management : 8-22 mins  Shanon Payor, OTD OTR/L  11/16/21, 10:41 AM

## 2021-11-16 NOTE — Progress Notes (Signed)
Patient instructed on AVS including follow up appointment, Medication changes and administration.  PIV was removed and patient was assisted to getting dressed.  Patient stated that he would take an uber home.  Volunteer services called to take patient to main entrance to wait for ride.  No s/s of distress noted denies and pain.

## 2021-11-16 NOTE — Discharge Summary (Signed)
Physician Discharge Summary   Raymond Lamb  male DOB: [birth year]  OEU:[id number]  PCP: Norina Buzzard, MD  Admit date: 11/13/2021 Discharge date: 11/16/2021  Admitted From: home Disposition:  home Home Health: Yes CODE STATUS: Full code  Discharge Instructions     Diet - low sodium heart healthy   Complete by: As directed    Diet Carb Modified   Complete by: As directed       Hospital Course:  For full details, please see H&P, progress notes, consult notes and ancillary notes.  Briefly,  Raymond Lamb is a 36 y.o. male with PMH significant for ESRD on hemodialysis, type 1 diabetes with history of DKA, prior stroke, hypertension presented in the ED with complaints of generalized abdominal pain associated with nausea and vomiting.  Patient reports he has missed dialysis for last 2 sessions.  DKA Questionable insulin compliance --Pt was transitioned from insulin gtt to subQ insulin mid-day on 5/22.     DM1, poorly controlled HbA1c is 11.0. and has been between 10-12 for the past year.   --Pt asked for Rx for insulin, but confirmed with pharm that pt has refills for both long and short-acting insulin at his preferred pharmacy.   --discharged back on home insulin regimen as below.  Will defer to outpatient endocrin for further adjustment.   Burning chest pain:  likely GERD. 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  Already received > 3 months of Eliquis.   --d/c home Eliquis    HTN:  --cont home dose of metoprolol, amlodipine, imdur & hydralazine    Hyperkalemia: resolved    PVD:  continue on statin, plavix & aspirin    ESRD: on HD.  Missed last 2 HD sessions. Recently moved to [facility] from [place] and wants to move his HD center. Nephro aware.  --Not taking vit D PTA  Depression:  severity unknown.  Continue on home dose of zoloft.    Hx of CVA:  continue on aspirin, statin, & plavix.     Discharge Diagnoses:  Principal Problem:   High anion gap metabolic acidosis Active Problems:   Depression with anxiety   ESRD (end stage renal disease) (HCC)   PVD (peripheral vascular disease) (HCC)   DKA, type 1 (HCC)   Hyperkalemia   HTN (hypertension)   Hyponatremia   30 Day Unplanned Readmission Risk Score    Flowsheet Row ED to Hosp-Admission (Current) from 11/13/2021 in [place] ICU/CCU  30 Day Unplanned Readmission Risk Score (%) 26.43 Filed at 11/16/2021 0801       This score is the patient's risk of an unplanned readmission within 30 days of being discharged (0 -100%). The score is based on dignosis, age, lab data, medications, orders, and past utilization.   Low:  0-14.9   Medium: 15-21.9   High: 22-29.9   Extreme: 30 and above         Discharge Instructions:  Allergies as of 11/16/2021       Reactions   Baclofen Itching, Other (See Comments)   Became disoriented, emesis   Iodinated Contrast Media Hives   Lisinopril Swelling   Other Swelling   Oxybenzone    Other reaction(s): Unknown        Medication List     STOP taking these medications  apixaban 5 MG Tabs tablet Commonly known as: ELIQUIS   ergocalciferol 1.25 MG (50000 UT) capsule Commonly known as: VITAMIN D2       TAKE these medications    amLODipine 10 MG tablet Commonly known as: NORVASC Take 10 mg by mouth daily.   aspirin 81 MG chewable tablet Chew 81 mg by mouth daily.   atorvastatin 80 MG tablet Commonly known as: LIPITOR Take 80 mg by mouth every evening.   cinacalcet 60 MG tablet Commonly known as: SENSIPAR Take 120 mg by mouth daily.   clopidogrel 75 MG tablet Commonly known as: PLAVIX Take 75 mg by mouth daily.   ezetimibe 10 MG tablet Commonly known as: ZETIA Take 10 mg by mouth daily.   gabapentin 300 MG  capsule Commonly known as: NEURONTIN Take 600 mg by mouth at bedtime.   hydrALAZINE 50 MG tablet Commonly known as: APRESOLINE Take 50 mg by mouth 3 (three) times daily.   isosorbide mononitrate 30 MG 24 hr tablet Commonly known as: IMDUR Take 30 mg by mouth daily.   Lantus SoloStar 100 UNIT/ML Solostar Pen Generic drug: insulin glargine Inject 13 Units into the skin at bedtime.   metoprolol succinate 100 MG 24 hr tablet Commonly known as: TOPROL-XL Take 100 mg by mouth daily.   nitroGLYCERIN 0.4 MG SL tablet Commonly known as: NITROSTAT Place 1 tablet under the tongue daily as needed.   NovoLOG FlexPen 100 UNIT/ML FlexPen Generic drug: insulin aspart Inject 5 Units into the skin 3 (three) times daily. Use per sliding scale no more then 50 units a day What changed:  how much to take additional instructions   omeprazole 40 MG capsule Commonly known as: PRILOSEC Take 40 mg by mouth daily with breakfast.   sertraline 100 MG tablet Commonly known as: ZOLOFT Take 150 mg by mouth in the morning.         Follow-up Information     Norina Buzzard, MD Follow up in 1 week(s).   Specialty: Family Medicine Contact information: [facility] [address] [place] [address] [phone]                 Allergies  Allergen Reactions   Baclofen Itching and Other (See Comments)    Became disoriented, emesis   Iodinated Contrast Media Hives   Lisinopril Swelling   Other Swelling   Oxybenzone     Other reaction(s): Unknown     The results of significant diagnostics from this hospitalization (including imaging, microbiology, ancillary and laboratory) are listed below for reference.   Consultations:   Procedures/Studies: DG Chest Portable 1 View  Result Date: 11/13/2021 CLINICAL DATA:  Vomiting for 2 days. Hyperglycemia. End-stage renal disease on dialysis. EXAM: PORTABLE CHEST 1 VIEW COMPARISON:  10/08/2021 FINDINGS: The heart size  and mediastinal contours are within normal limits. Both lungs are clear. The visualized skeletal structures are unremarkable. IMPRESSION: No active disease. Electronically Signed   By: Marlaine Hind M.D.   On: 11/13/2021 17:29      Labs: BNP (last 3 results) Recent Labs    03/18/21 2042  BNP 67.8   Basic Metabolic Panel: Recent Labs  Lab 11/13/21 1948 11/13/21 2049 11/14/21 0123 11/14/21 0435 11/14/21 0900 11/15/21 0332 11/16/21 0504  NA 123*  122*  --  136 140 137 134* 139  K 4.6  4.5   < > 3.4* 3.6 3.3* 4.0 3.7  CL 77*  76*  --  94* 98 95* 95* 98  CO2 <7*  <7*  --  25 27 27 23 27   GLUCOSE >1,200*  >1,200*  --  281* 181* 165* 251* 166*  BUN 65*  66*  --  32* 33* 33* 35* 28*  CREATININE 14.73*  14.94*  --  8.62* 8.98* 9.48* 10.63* 8.16*  CALCIUM 7.6*  7.5*  --  8.8* 8.9 9.0 8.1* 7.9*  MG  --   --  1.8  --   --   --   --   PHOS 10.9*  --  3.6  --   --   --   --    < > = values in this interval not displayed.   Liver Function Tests: Recent Labs  Lab 11/13/21 1657 11/13/21 1948 11/14/21 0123  AST 19  --  18  ALT 15  --  13  ALKPHOS 81  --  76  BILITOT 2.3*  --  1.4*  PROT 7.5  --  7.3  ALBUMIN 3.8 3.8 3.8   Recent Labs  Lab 11/13/21 1657  LIPASE 122*   No results for input(s): AMMONIA in the last 168 hours. CBC: Recent Labs  Lab 11/13/21 1657 11/13/21 1948 11/14/21 0123 11/15/21 0332 11/16/21 0504  WBC 10.3 11.3*  11.8* 10.8* 7.9 8.0  NEUTROABS 9.1*  --   --   --   --   HGB 11.3* 10.7*  11.0* 11.5* 11.5* 12.0*  HCT 39.1 35.9*  35.9* 32.9* 33.7* 36.7*  MCV 104.3* 100.6*  99.4 87.0 88.7 92.4  PLT 180 191  181 189 155 160   Cardiac Enzymes: No results for input(s): CKTOTAL, CKMB, CKMBINDEX, TROPONINI in the last 168 hours. BNP: Invalid input(s): POCBNP CBG: Recent Labs  Lab 11/15/21 1553 11/15/21 1933 11/15/21 2310 11/16/21 0323 11/16/21 0843  GLUCAP 407* 159* 130* 311* 221*   D-Dimer No results for input(s): DDIMER in the last  72 hours. Hgb A1c Recent Labs    11/14/21 0123  HGBA1C 11.0*   Lipid Profile No results for input(s): CHOL, HDL, LDLCALC, TRIG, CHOLHDL, LDLDIRECT in the last 72 hours. Thyroid function studies No results for input(s): TSH, T4TOTAL, T3FREE, THYROIDAB in the last 72 hours.  Invalid input(s): FREET3 Anemia work up No results for input(s): VITAMINB12, FOLATE, FERRITIN, TIBC, IRON, RETICCTPCT in the last 72 hours. Urinalysis No results found for: COLORURINE, APPEARANCEUR, [place], [place], GLUCOSEU, [place], [place], [place], PROTEINUR, UROBILINOGEN, NITRITE, LEUKOCYTESUR Sepsis Labs Invalid input(s): PROCALCITONIN,  WBC,  LACTICIDVEN Microbiology Recent Results (from the past 240 hour(s))  MRSA Next Gen by PCR, Nasal     Status: None   Collection Time: 11/13/21  7:18 PM   Specimen: Nasal Mucosa; Nasal Swab  Result Value Ref Range Status   MRSA by PCR Next Gen NOT DETECTED NOT DETECTED Final    Comment: (NOTE) The GeneXpert MRSA Assay (FDA approved for NASAL specimens only), is one component of a comprehensive MRSA colonization surveillance program. It is not intended to diagnose MRSA infection nor to guide or monitor treatment for MRSA infections. Test performance is not FDA approved in patients less than 7 years old. Performed at [facility], [address] [place], [place] [address]      Total time spend on discharging this patient, including the last patient exam, discussing the hospital stay, instructions for ongoing care as it relates to all pertinent caregivers, as well as preparing the medical discharge records, prescriptions, and/or referrals as applicable, is 45 minutes.    Enzo Bi, MD  [facility] 11/16/2021, 11:54  AM    

## 2021-11-16 NOTE — Progress Notes (Signed)
[facility]  ROUNDING NOTE   Subjective:   Patient seen sitting up in bed, preparing for breakfast Nurse at bedside Denies chest pain or chest discomfort States symptoms resolved yesterday evening and have not returned. Remains on room air  Objective:  Vital signs in last 24 hours:  Temp:  [98 F (36.7 C)-98.5 F (36.9 C)] 98.4 F (36.9 C) (05/24 0853) Pulse Rate:  [86-116] 88 (05/24 0600) Resp:  [14-29] 22 (05/24 0600) BP: (110-171)/(71-102) 133/87 (05/24 0600) SpO2:  [90 %-100 %] 97 % (05/24 0600) Weight:  [63.2 kg] 63.2 kg (05/23 1329)  Weight change:  Filed Weights   11/13/21 2015 11/13/21 2355 11/15/21 1329  Weight: 65.6 kg 62.9 kg 63.2 kg    Intake/Output: I/O last 3 completed shifts: In: 1440 [P.O.:1440] Out: 2841 [Other:1510]   Intake/Output this shift:  Total I/O In: 480 [P.O.:480] Out: -   Physical Exam: General: NAD  Head: Normocephalic, atraumatic. Moist oral mucosal membranes  Eyes: Anicteric  Lungs:  Clear to auscultation, normal effort  Heart: Regular rate and rhythm  Abdomen:  Soft, nontender  Extremities:  No peripheral edema.  Neurologic: Nonfocal, moving all four extremities  Skin: No lesions  Access: Lt AVF    Basic Metabolic Panel: Recent Labs  Lab 11/13/21 1948 11/13/21 2049 11/14/21 0123 11/14/21 0435 11/14/21 0900 11/15/21 0332 11/16/21 0504  NA 123*  122*  --  136 140 137 134* 139  K 4.6  4.5   < > 3.4* 3.6 3.3* 4.0 3.7  CL 77*  76*  --  94* 98 95* 95* 98  CO2 <7*  <7*  --  25 27 27 23 27   GLUCOSE >1,200*  >1,200*  --  281* 181* 165* 251* 166*  BUN 65*  66*  --  32* 33* 33* 35* 28*  CREATININE 14.73*  14.94*  --  8.62* 8.98* 9.48* 10.63* 8.16*  CALCIUM 7.6*  7.5*  --  8.8* 8.9 9.0 8.1* 7.9*  MG  --   --  1.8  --   --   --   --   PHOS 10.9*  --  3.6  --   --   --   --    < > = values in this interval not displayed.     Liver Function Tests: Recent Labs  Lab 11/13/21 1657 11/13/21 1948  11/14/21 0123  AST 19  --  18  ALT 15  --  13  ALKPHOS 81  --  76  BILITOT 2.3*  --  1.4*  PROT 7.5  --  7.3  ALBUMIN 3.8 3.8 3.8    Recent Labs  Lab 11/13/21 1657  LIPASE 122*    No results for input(s): AMMONIA in the last 168 hours.  CBC: Recent Labs  Lab 11/13/21 1657 11/13/21 1948 11/14/21 0123 11/15/21 0332 11/16/21 0504  WBC 10.3 11.3*  11.8* 10.8* 7.9 8.0  NEUTROABS 9.1*  --   --   --   --   HGB 11.3* 10.7*  11.0* 11.5* 11.5* 12.0*  HCT 39.1 35.9*  35.9* 32.9* 33.7* 36.7*  MCV 104.3* 100.6*  99.4 87.0 88.7 92.4  PLT 180 191  181 189 155 160     Cardiac Enzymes: No results for input(s): CKTOTAL, CKMB, CKMBINDEX, TROPONINI in the last 168 hours.  BNP: Invalid input(s): POCBNP  CBG: Recent Labs  Lab 11/15/21 1553 11/15/21 1933 11/15/21 2310 11/16/21 0323 11/16/21 0843  GLUCAP 407* 159* 130* 311* 221*     Microbiology:  Results for orders placed or performed during the hospital encounter of 11/13/21  MRSA Next Gen by PCR, Nasal     Status: None   Collection Time: 11/13/21  7:18 PM   Specimen: Nasal Mucosa; Nasal Swab  Result Value Ref Range Status   MRSA by PCR Next Gen NOT DETECTED NOT DETECTED Final    Comment: (NOTE) The GeneXpert MRSA Assay (FDA approved for NASAL specimens only), is one component of a comprehensive MRSA colonization surveillance program. It is not intended to diagnose MRSA infection nor to guide or monitor treatment for MRSA infections. Test performance is not FDA approved in patients less than 72 years old. Performed at [facility], [address] [place], [place] [address]     Coagulation Studies: No results for input(s): LABPROT, INR in the last 72 hours.  Urinalysis: No results for input(s): COLORURINE, LABSPEC, PHURINE, GLUCOSEU, HGBUR, BILIRUBINUR, KETONESUR, PROTEINUR, UROBILINOGEN, NITRITE, LEUKOCYTESUR in the last 72 hours.  Invalid input(s): APPERANCEUR    Imaging: No results  found.   Medications:      amLODipine  10 mg Oral Daily   apixaban  5 mg Oral BID   aspirin  81 mg Oral Daily   atorvastatin  80 mg Oral QPM   Chlorhexidine Gluconate Cloth  6 each Topical Q0600   cinacalcet  120 mg Oral Q breakfast   clopidogrel  75 mg Oral Daily   docusate sodium  100 mg Oral BID   ezetimibe  10 mg Oral Daily   gabapentin  600 mg Oral QHS   hydrALAZINE  50 mg Oral Q8H   insulin aspart  0-15 Units Subcutaneous Q4H   insulin glargine-yfgn  8 Units Subcutaneous Daily   isosorbide mononitrate  30 mg Oral Daily   loratadine  10 mg Oral Daily   metoprolol succinate  100 mg Oral Daily   pantoprazole (PROTONIX) IV  40 mg Intravenous Q12H   sertraline  150 mg Oral q AM   acetaminophen **OR** acetaminophen, dextrose, famotidine, hydrALAZINE, hydrocortisone cream, metoprolol tartrate, nitroGLYCERIN, ondansetron **OR** ondansetron (ZOFRAN) IV, phenol  Assessment/ Plan:  [name] is a 36 y.o.  male with a history of ESRD on hemodialysis with left upper extremity AV fistula, type 1 diabetes with prior history of DKA, peripheral vascular disease, prior stroke, hypertension who comes to the ED complaining of generalized abdominal pain and vomiting for the last 2 days.  He has been admitted for Hyperkalemia [E87.5] High anion gap metabolic acidosis [H29.92] ESRD on hemodialysis (HCC) [N18.6, Z99.2] Type 1 diabetes mellitus with ketoacidosis without coma ([place]) [E10.10]   Hyperkalemia with end stage renal disease on dialysis.   Potassium stable today, 3.7.  Patient received dialysis yesterday with 1.5 L removed.  Next treatment scheduled for Thursday.  2. Anemia of chronic kidney disease   Lab Results  Component Value Date   HGB 12.0 (L) 11/16/2021    Hemoglobin at goal.  We will continue to monitor  3. Hyperglycemia with diabetes mellitus type I with chronic kidney disease: insulin dependent. Home regimen includes Lantus and Novolog. Most recent hemoglobin  A1c is 10.4 on 10/09/21.    Glucose slightly elevated this morning.  Sliding scale insulin managed by primary team.  4. Secondary Hyperparathyroidism Lab Results  Component Value Date   CALCIUM 7.9 (L) 11/16/2021   PHOS 3.6 11/14/2021    Calcium remains decreased.  We will continue to monitor with improved nutrition   LOS: 3   5/24/202310:16 AM

## 2021-11-16 NOTE — Progress Notes (Signed)
Inpatient Diabetes Program Recommendations  AACE/ADA: New Consensus Statement on Inpatient Glycemic Control (2015)  Target Ranges:  Prepandial:   less than 140 mg/dL      Peak postprandial:   less than 180 mg/dL (1-2 hours)      Critically ill patients:  140 - 180 mg/dL    Latest Reference Range & Units 11/15/21 07:19 11/15/21 14:36 11/15/21 15:53 11/15/21 19:33 11/15/21 23:10 11/16/21 03:23 11/16/21 08:43  Glucose-Capillary 70 - 99 mg/dL 151 (H) 473 (H) 407 (H) 159 (H) 130 (H) 311 (H) 221 (H)   History: Type 1 diabetes, ESRD   Home DM Meds: Lantus 13 units QHS (Out of Lantus X 2 weeks per Home Med rec)                             Novolog 2-6 units TID with meals (2 units with meals + SSI)   Current Orders: Semglee 8 units Daily      Novolog Sensitive Correction Scale/ SSI (0-9 units) Q4 hours    MD- Please consider:  1. Increase Semglee slightly to 10 units Daily  2. Change Novolog SSI to TID AC + HS  3. Start Novolog Meal Coverage: Novolog 2 units TID with meals (home dose) HOLD if pt eats <50% meals   Endocrinologist: Dr. Glade Stanford with [facility] Last seen 08/22/2021--Next ENDO appt 10/31/2021 Was instructed to take the following: Decrease Lantus to 13 units Daily Take Novolog 2 units with meals (ONLY if you eat) Take Novolog SSI per the following scale: 1 unit if your pre-meal blood sugar is 150-250 2 units if your pre-meal blood sugar is 251-350 3 units if your pre-meal blood sugar is 351-450 4 units if your pre-meal blood sugar is 451+    --Will follow patient during hospitalization--  Tama Headings RN, MSN, BC-ADM Inpatient Diabetes Coordinator Team Pager [phone] (8a-5p)

## 2021-11-16 NOTE — TOC Transition Note (Signed)
Transition of Care [facility]) - CM/SW Discharge Note   Patient Details  Name: Raymond Lamb MRN: [id number] Date of Birth: Dec 19, 1985  [facility] CM/SW Contact:  Alberteen Sam, LCSW Phone Number: 11/16/2021, 2:49 PM   Clinical Narrative:     Patient to discharge home today, per patient's mother she provides care for patient at home and is interested in hemi walker dme to be delivered to the home, confirmed address in chart for dme to be shipped to home by [employer]. No other dme needs. Agreeable to home health PT and OT, CSW set patient up with [facility] confirmed with Corene Cornea they are able to accept.   No other discharge needs at this time.   Final next level of care: [place] Barriers to Discharge: No Barriers Identified   Patient Goals and CMS Choice Patient states their goals for this hospitalization and ongoing recovery are:: to go home CMS Medicare.gov Compare Post Acute Care list provided to:: Patient Represenative (must comment) (mother) Choice offered to / list presented to : Patient  Discharge Placement                       Discharge Plan and Services                DME Arranged:  (hemiwalker) DME Agency: [employer]       HH Arranged: PT          Social Determinants of Health (SDOH) Interventions     Readmission Risk Interventions    10/11/2021    1:19 PM  Readmission Risk Prevention Plan  Transportation Screening Complete  PCP or Specialist Appt within 3-5 Days Complete  HRI or [place] Not Complete  HRI or Home Care Consult comments NA  Social Work Consult for [place] Planning/Counseling Not Complete  SW consult not completed comments NA  Palliative Care Screening Not Applicable  Medication Review Press photographer) Complete

## 2021-11-16 NOTE — Plan of Care (Signed)
  Problem: Education: Goal: Knowledge of General Education information will improve Description: Including pain rating scale, medication(s)/side effects and non-pharmacologic comfort measures Outcome: Adequate for Discharge   Problem: Health Behavior/Discharge Planning: Goal: Ability to manage health-related needs will improve Outcome: Adequate for Discharge   Problem: Clinical Measurements: Goal: Ability to maintain clinical measurements within normal limits will improve Outcome: Adequate for Discharge Goal: Will remain free from infection Outcome: Adequate for Discharge Goal: Diagnostic test results will improve Outcome: Adequate for Discharge Goal: Respiratory complications will improve Outcome: Adequate for Discharge Goal: Cardiovascular complication will be avoided Outcome: Adequate for Discharge   Problem: Activity: Goal: Risk for activity intolerance will decrease Outcome: Adequate for Discharge   Problem: Nutrition: Goal: Adequate nutrition will be maintained Outcome: Adequate for Discharge   Problem: Coping: Goal: Level of anxiety will decrease Outcome: Adequate for Discharge   Problem: Elimination: Goal: Will not experience complications related to bowel motility Outcome: Adequate for Discharge   Problem: Pain Managment: Goal: General experience of comfort will improve Outcome: Adequate for Discharge   Problem: Safety: Goal: Ability to remain free from injury will improve Outcome: Adequate for Discharge   Problem: Skin Integrity: Goal: Risk for impaired skin integrity will decrease Outcome: Adequate for Discharge   

## 2021-11-16 NOTE — Progress Notes (Signed)
    Durable Medical Equipment  (From admission, onward)           Start     Ordered   11/16/21 1538  For home use only DME standard manual wheelchair with seat cushion  Once        11/16/21 1538   11/16/21 1521  For home use only DME Walker hemi  Once       Question:  Patient needs a walker to treat with the following condition  Answer:  Weakness   11/16/21 [address] [place]

## 2021-11-16 NOTE — Progress Notes (Signed)
PHARMACIST - PHYSICIAN COMMUNICATION  CONCERNING: IV to Oral Route Change Policy  RECOMMENDATION: This patient is receiving pantoprazole by the intravenous route.  Based on criteria approved by the Pharmacy and Therapeutics Committee, the intravenous medication(s) is/are being converted to the equivalent oral dose form(s).   DESCRIPTION: These criteria include: The patient is eating (either orally or via tube) and/or has been taking other orally administered medications for a least 24 hours The patient has no evidence of active gastrointestinal bleeding or impaired GI absorption (gastrectomy, short bowel, patient on TNA or NPO).  If you have questions about this conversion, please contact the [place], [facility] 11/16/2021 11:26 AM

## 2023-02-09 IMAGING — CT CT CERVICAL SPINE W/O CM
3 of 4 series · 13 of 33 positions shown, 16 images · non-contrast
Comparison: None.

CLINICAL DATA: Mental status change, unknown cause; foudn down



[Series 4: sagittal bone · sagittal · 0.39mm/px · 5 of 107 slices shown, 6 images]
[im 36/107  bone]
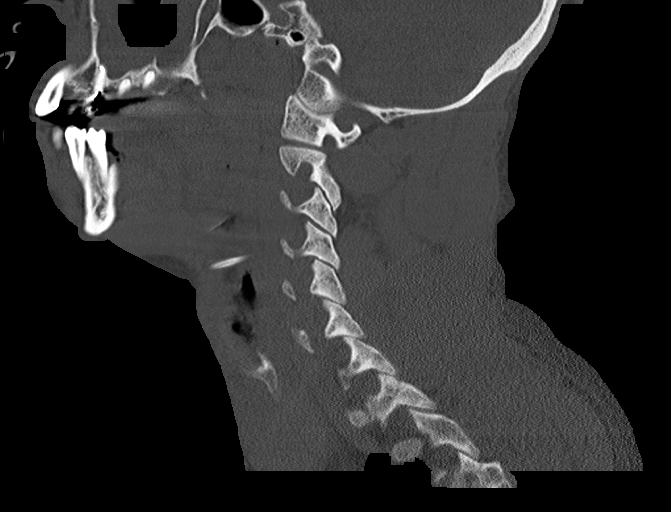
[im 45/107  bone]
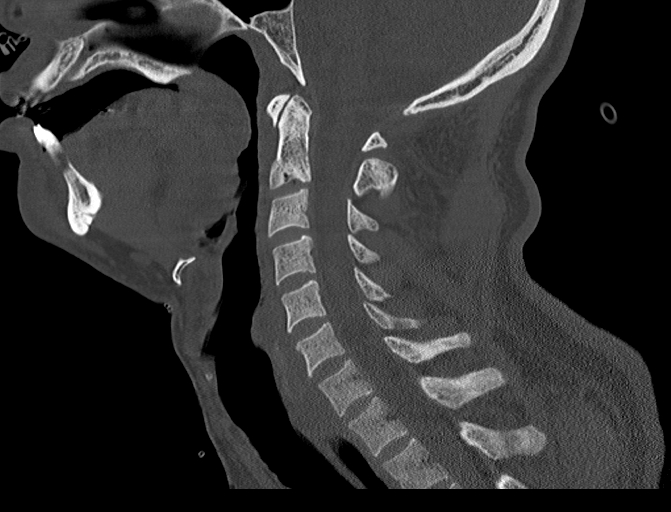
[im 54/107  soft-tissue]
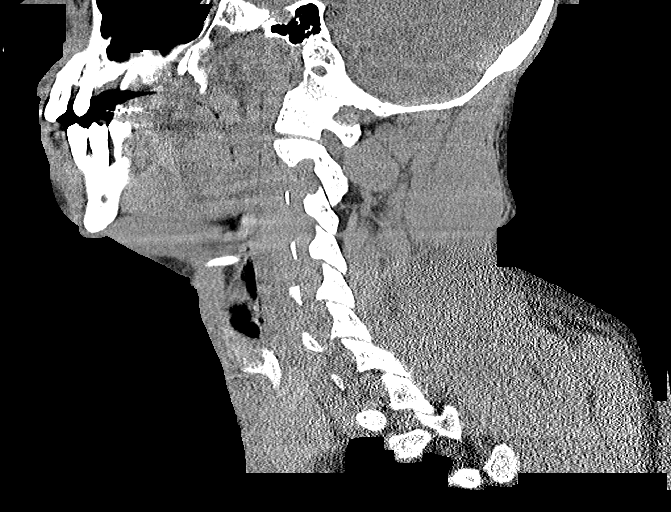
[im 54/107  bone]
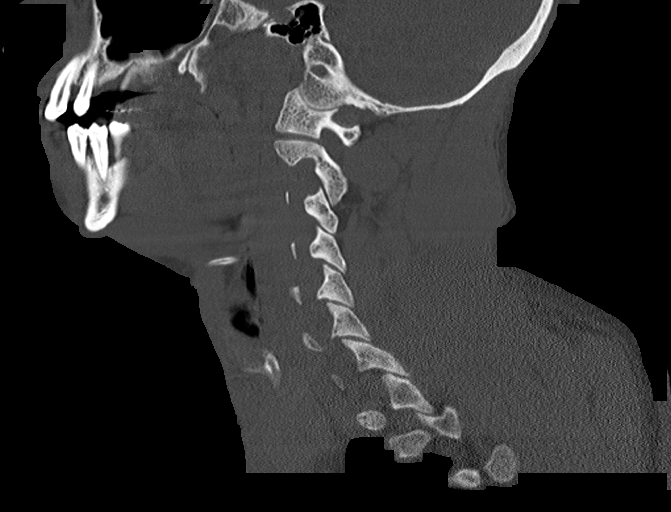
[im 62/107  bone]
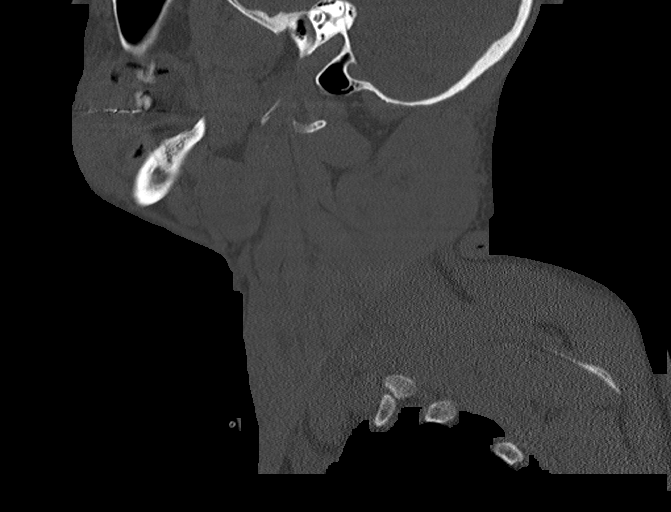
[im 71/107  bone]
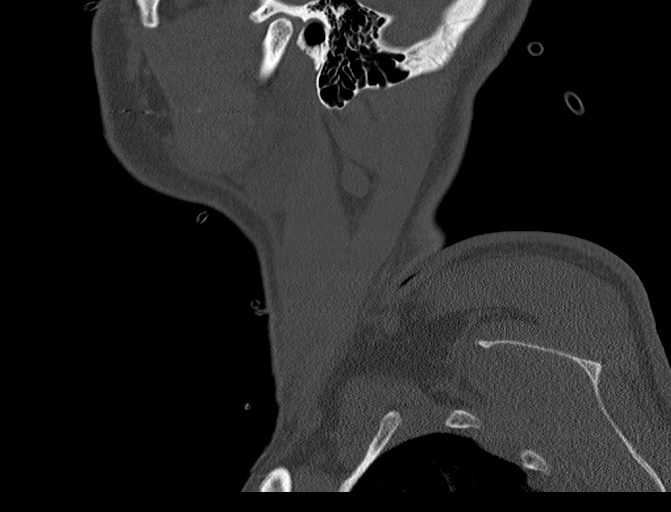

[Series 5: coronal bone · coronal · 0.45mm/px · 3 of 85 slices shown]
[im 29/85  bone]
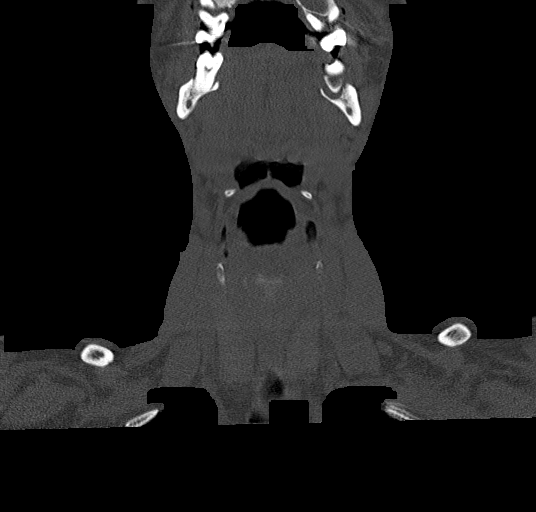
[im 38/85  bone]
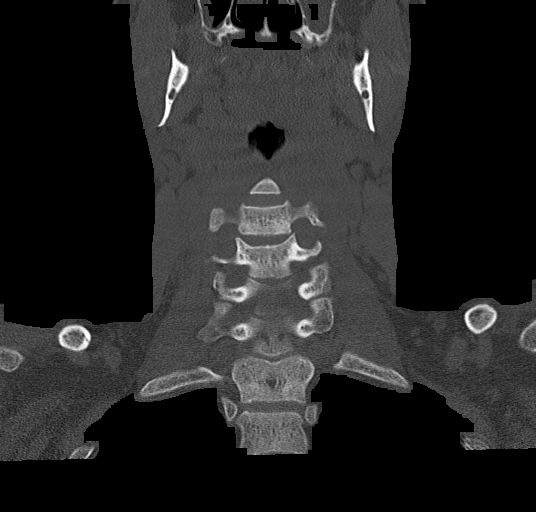
[im 47/85  bone]
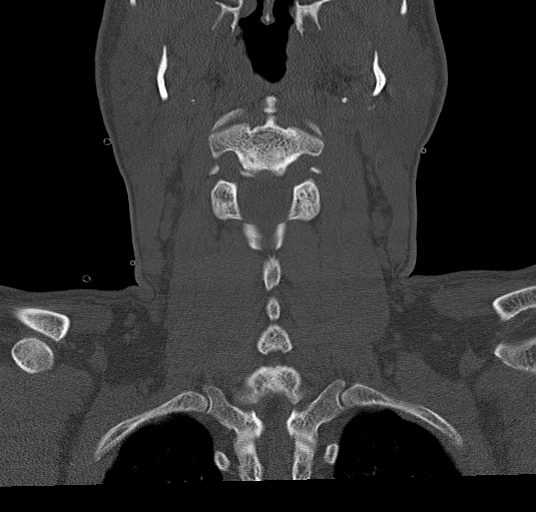

[Series 6: orthogonal bone · axial · 0.51mm/px · z∈[-292,-163]mm · 5 of 111 slices shown, 7 images]
[im 19/111  soft-tissue]
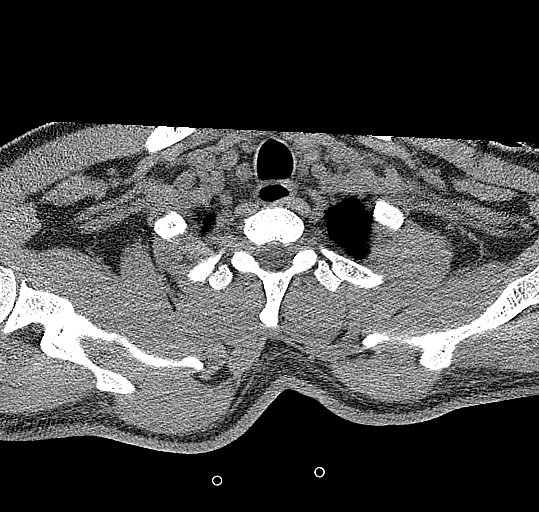
[im 19/111  bone]
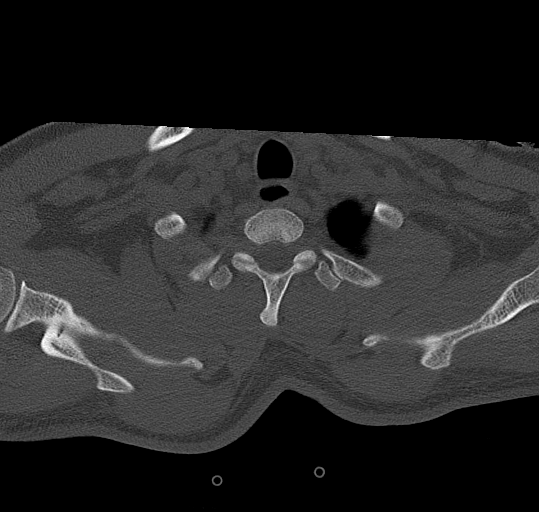
[im 37/111  bone]
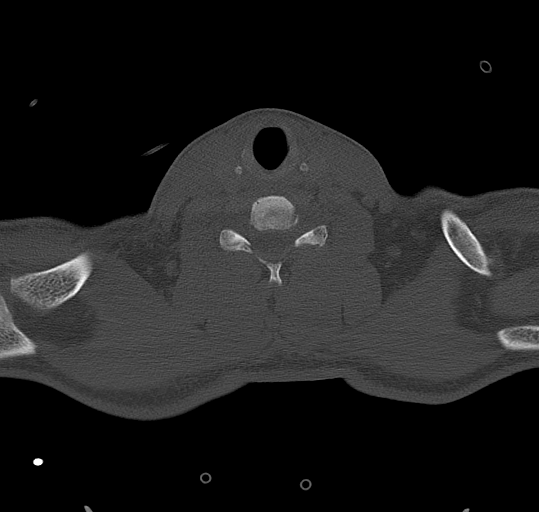
[im 56/111  bone]
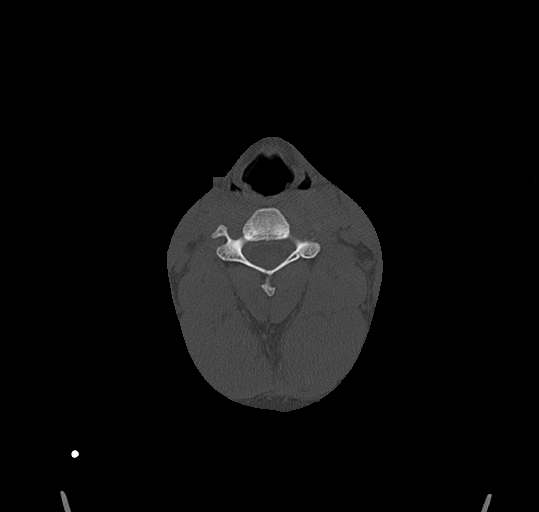
[im 74/111  bone]
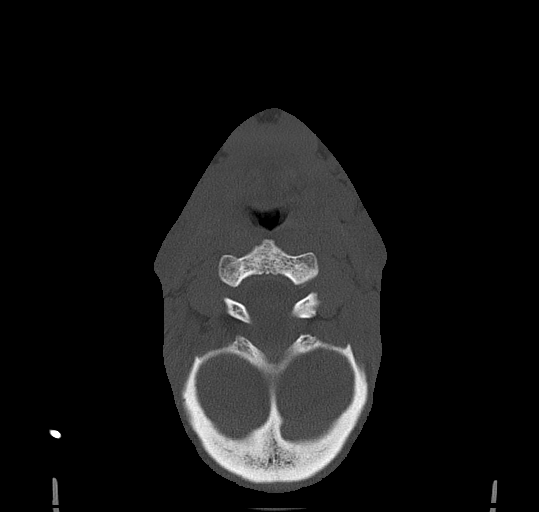
[im 92/111  soft-tissue]
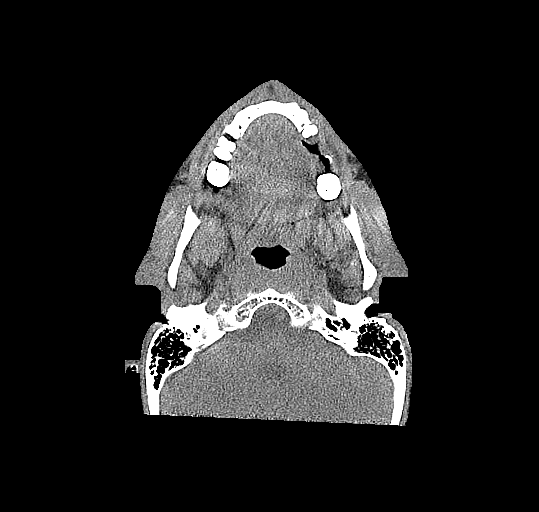
[im 92/111  bone]
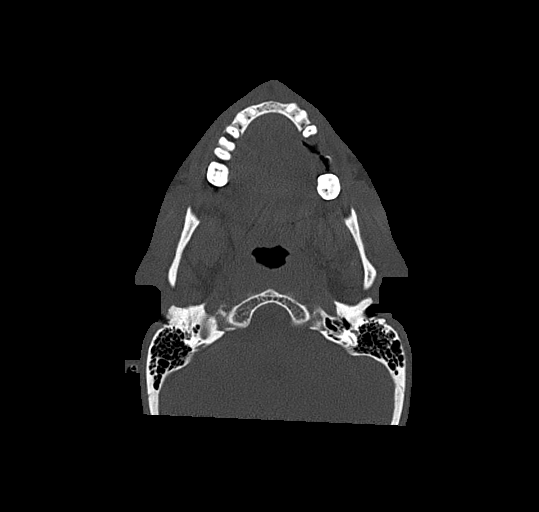

[13 of 33 positions shown; findings below may reference images not displayed]

FINDINGS: CT HEAD FINDINGS

Brain:

No evidence of large-territorial acute infarction. No parenchymal
hemorrhage. No mass lesion. No extra-axial collection.

No mass effect or midline shift. No hydrocephalus. Basilar cisterns
are patent.

Vascular: No hyperdense vessel. Atherosclerotic calcifications are
present within the cavernous internal carotid and vertebral
arteries.

Skull: No acute fracture or focal lesion.

Sinuses/Orbits: Most complete opacification of the left maxillary
sinus. Paranasal sinuses and mastoid air cells are clear. The orbits
are unremarkable.

Other: Nonspecific right occipital scalp calcification ([DATE]).

CT CERVICAL SPINE FINDINGS

Alignment: Normal.

Skull base and vertebrae: Multilevel mild degenerative changes of
the spine. No acute fracture. No aggressive appearing focal osseous
lesion or focal pathologic process.

Soft tissues and spinal canal: No prevertebral fluid or swelling. No
visible canal hematoma.

Upper chest: Unremarkable.

Other: None.
IMPRESSION: 1. No acute intracranial abnormality.
2. No acute displaced fracture or traumatic listhesis of the
cervical spine.
3. Nonspecific right occipital scalp calcification. Correlate with
physical exam.
4. Left maxillary sinus disease.

## 2023-03-17 IMAGING — DX DG CHEST 1V PORT
1 series · 1 of 1 positions shown · non-contrast
Comparison: 10/08/2021

CLINICAL DATA: Vomiting for 2 days. Hyperglycemia. End-stage renal
disease on dialysis.

EXAM:
PORTABLE CHEST 1 VIEW

[chest ap]
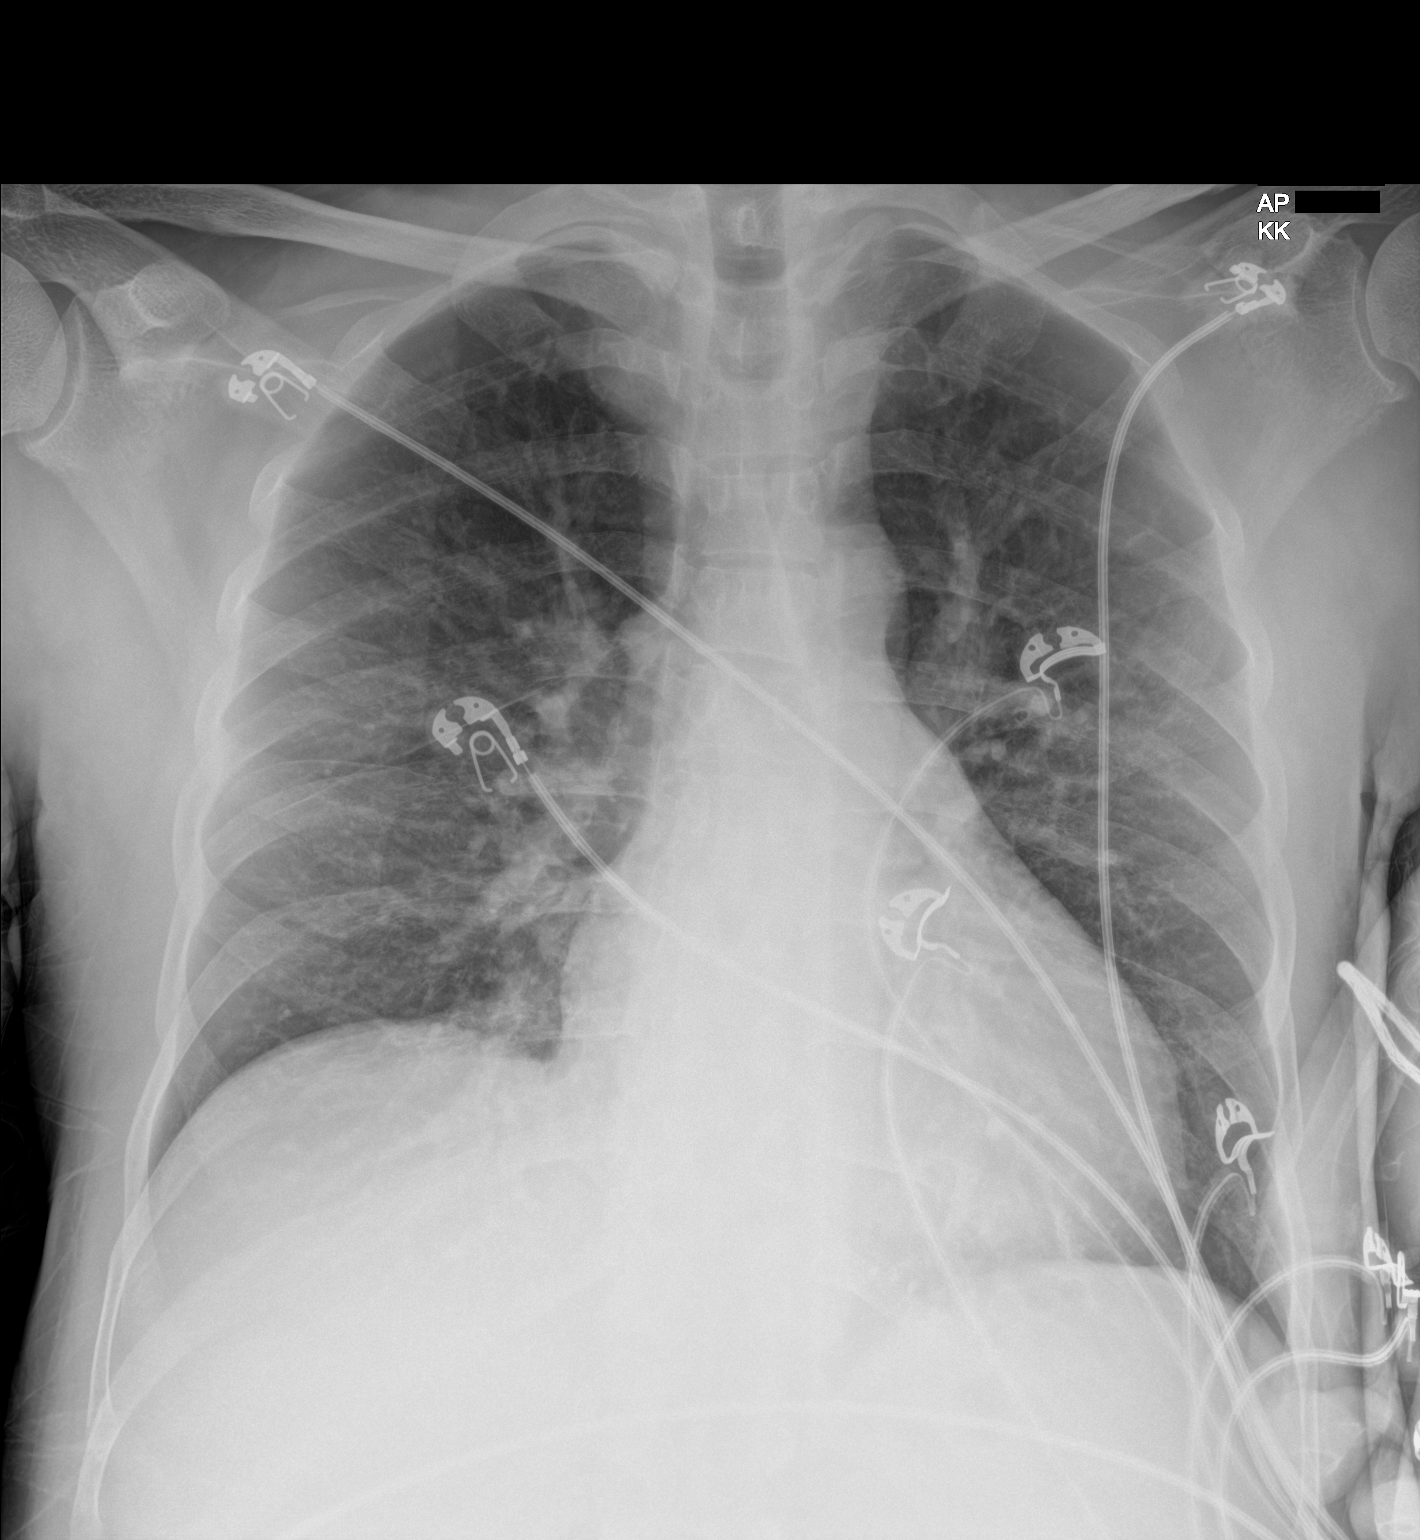

[1 of 1 positions shown; findings below may reference images not displayed]

FINDINGS: The heart size and mediastinal contours are within normal limits.
Both lungs are clear. The visualized skeletal structures are
unremarkable.
IMPRESSION: No active disease.

## 2023-06-09 ENCOUNTER — Encounter: Payer: Self-pay | Admitting: Emergency Medicine

## 2023-06-09 ENCOUNTER — Emergency Department
Admission: EM | Admit: 2023-06-09 | Discharge: 2023-06-09 | Discharge status: Against Medical Advice | Payer: Medicare HMO | Attending: Emergency Medicine | Admitting: Emergency Medicine

## 2023-06-09 ENCOUNTER — Other Ambulatory Visit: Payer: Self-pay

## 2023-06-09 DIAGNOSIS — Z992 Dependence on renal dialysis: Secondary | ICD-10-CM | POA: Insufficient documentation

## 2023-06-09 DIAGNOSIS — R252 Cramp and spasm: Secondary | ICD-10-CM | POA: Diagnosis present

## 2023-06-09 DIAGNOSIS — Z5321 Procedure and treatment not carried out due to patient leaving prior to being seen by health care provider: Secondary | ICD-10-CM | POA: Diagnosis not present

## 2023-06-09 LAB — CBC
HCT: 41.7 % (ref 39.0–52.0)
Hemoglobin: 13.8 g/dL (ref 13.0–17.0)
MCH: 31.6 pg (ref 26.0–34.0)
MCHC: 33.1 g/dL (ref 30.0–36.0)
MCV: 95.4 fL (ref 80.0–100.0)
Platelets: 200 10*3/uL (ref 150–400)
RBC: 4.37 MIL/uL (ref 4.22–5.81)
RDW: 15.2 % (ref 11.5–15.5)
WBC: 8 10*3/uL (ref 4.0–10.5)
nRBC: 0 % (ref 0.0–0.2)

## 2023-06-09 LAB — BASIC METABOLIC PANEL
Anion gap: 15 (ref 5–15)
BUN: 59 mg/dL — ABNORMAL HIGH (ref 6–20)
CO2: 25 mmol/L (ref 22–32)
Calcium: 9.3 mg/dL (ref 8.9–10.3)
Chloride: 94 mmol/L — ABNORMAL LOW (ref 98–111)
Creatinine, Ser: 10.01 mg/dL — ABNORMAL HIGH (ref 0.61–1.24)
GFR, Estimated: 6 mL/min — ABNORMAL LOW (ref >60)
Glucose, Bld: 270 mg/dL — ABNORMAL HIGH (ref 70–99)
Potassium: 5.2 mmol/L — ABNORMAL HIGH (ref 3.5–5.1)
Sodium: 134 mmol/L — ABNORMAL LOW (ref 135–145)

## 2023-06-09 LAB — MAGNESIUM: Magnesium: 2.7 mg/dL — ABNORMAL HIGH (ref 1.7–2.4)

## 2023-06-09 NOTE — ED Notes (Signed)
Patient not seen in lobby. Called X1

## 2023-06-09 NOTE — ED Triage Notes (Signed)
Pt to ED from home c/o left leg spasms x3 days that are the worst they've been.  Dialysis pt, Tuesday/Thursday/Saturday and hasn't missed any.  States hx of potassium being high.

## 2023-06-09 NOTE — ED Notes (Signed)
Patient called X3 with no answer. Searched lobby and checked outside. patient not found

## 2024-01-30 NOTE — Care Plan (Signed)
 [facility]  Care Management   Patient is a 38 y.o. admitted on 01/29/2024 for PAD (peripheral artery disease) (CMS-HCC) [I73.9]. Per review of the medical record and discussion with the treating team, the patient does not meet indicators for a full assessment at this time. CM will continue to assess for discharge needs and follow up, as indicated.  Remi A Akinkuotu, RN January 30, 2024 12:26 PM   Food Insecurity: No Food Insecurity (01/30/2024)   Hunger Vital Sign   . Worried About Programme researcher, broadcasting/film/video in the Last Year: Never true   . Ran Out of Food in the Last Year: Never true    Financial Resource Strain: Low Risk  (01/30/2024)   Overall Financial Resource Strain (CARDIA)   . Difficulty of Paying Living Expenses: Not very hard   Housing: Low Risk  (01/30/2024)   Transportation Needs: No Transportation Needs (01/30/2024)   PRAPARE - Transportation   . Lack of Transportation (Medical): No   . Lack of Transportation (Non-Medical): No

## 2024-01-30 NOTE — Discharge Summary (Signed)
 ------------------------------------------------------------------------------- Attestation signed by Ave Bosworth, MD at 01/31/24 1209 I saw and evaluated the patient, participating in the key portions of the service.  I reviewed the resident's note.  I agree with the resident's findings and plan.   Bosworth Ave, MD Professor of Surgery Division of Vascular Surgery Pager : [phone]  -------------------------------------------------------------------------------   Discharge Summary  Admit date: 01/29/2024  Discharge date and time: 01/30/24  Discharge to:  Home  Discharge Service: Surg Vascular (SRV)  Discharge Attending Physician: Bosworth Ave, MD  Discharge Diagnoses: Raymond Lamb is a 38 y.o. male with prior fem-PT bypass in 2017 now with bypass occlusion and LLE wounds presents for diagnostic angiogram.   Secondary Diagnosis: Active Problems:   * No active hospital problems. *   OR Procedures:   * No procedures listed * Date 01/29/2024 -------------------   Ancillary Procedures: HD  Discharge Day Services: The patient was seen and examined by the  Surgery team on the day of discharge. Vital signs and laboratory values were appropriate. Surgical wounds were inspected and found to be consistent with the performed procedure. Discharge plan was discussed, Instructions were given, and all questions answered.  Subjective No acute events overnight. Pain Controlled. No fever or chills.  Objective  Patient Vitals for the past 8 hrs:  BP Temp Temp src Pulse Resp  01/30/24 1100 164/89 -- -- 84 18  01/30/24 1030 141/89 -- -- 84 16  01/30/24 1000 193/101 -- -- 87 --  01/30/24 0930 155/94 -- -- 84 18  01/30/24 0911 151/83 -- -- 81 --  01/30/24 0900 151/83 -- -- 81 16  01/30/24 0847 152/91 -- -- 79 18  01/30/24 0835 157/91 35.5 C (95.9 F) Temporal 81 18  01/30/24 0745 -- -- -- 79 --   No intake/output data recorded.  Physical Exam  Physical  Exam: HEENT: Normocephalic, atraumatic.  Cardiovascular: Regular rate and rhythm. Respiratory:  Nonlabored respirations. Abdomen: Nondistended. Nontender. No rebound or guarding. GU: does not make urine. Groin incision hemostatic     HPI: Raymond Lamb is a 38 y.o. male with a PMHx of LUE DVT, T1DM, HTN, anoxic brain injury 05/2015, and NSTEMI,  ESRD on HD s/p LUE radiocephalic AVF, BLE PAD s/p RSFA stenting 07/2015, s/p L fem-PTA bypass with vein 12/2015.    Per EMR, patient fell from a seated position in a shower chair on 01/16/24 and went to urgent care in [place] where he was found to have left tibia and fibula fractures. The leg was splinted. He was seen by family medicine on 01/18/24 and there was plans for patient to follow with [facility] today. Patient presented to the ED with increased pain in his LLE. Per the patient, he believes the pain is likely secondary to his fracture as it is around his shin and dissimilar to the pain he has had in the past from decreased blood flow. Wounds appear unchanged. A LLE arterial duplex was obtained by the ED and demonstrated to the L fem-PTA bypass is occluded. On 12/19/23, the bypass was patent.    Patient uses a wheelchair for mobility and has not been ambulatory for several years. He has a LLE contraction and is unable to straighten his leg past 90 degrees. On exam, he has a monophasic LLE DP and venous LLE PT. He does not have sensation to the dorsum of his left foot; however he states this is unchanged from his baseline. Endorses sensation to the left calf and shin. He is not  able to move his left toes. He can move his left ankle slightly but has no motor strength. LLE compartments are soft.    Brief history of recent events include the following: Last saw Dr. Ave in clinic on 12/06/23 with plan to RTC 01/16/24 with BLE arterial duplex, a referral to wound clinic, and a CTA. He presented to ED 12/19/23 with increased pain to  the soles of his feet bilaterally and no change in his BLE wounds. Patient was discharged and plan was for patient to undergo an angiogram outpatient.     Hospital Course: Raymond Lamb is a 38 y.o. male with history of ESRD on HD,  LUE DVT, T1DM, HTN, anoxic brain injury 05/2015, and NSTEMI,  that was admitted to the hospital on 01/29/2024 and was taken to the OR on  for a diagnostic angiogram.  Intraoperative findings include possible bypass target in mid PTA .  He did well postoperatively. His potassium level was found to be at 7 in post op. EKG without arhythmia. He was promptly taken to dialysis and his K was corrected to 4.7.  His diet was slowly advanced and at the time of discharge was tolerating a regular diet.    Pain was controlled with P.O. pain medication.  Endocrine was consulted given the history of poorly controlled DM1. They also provided outpatient insulin  plan for the patient. At the time of discharge the patient returned to prior activity level. He is being discharged on 01/30/24 (POD 1) to home in stable condition with planned outpatient follow-up.  He will resume outpatient dialysis plan.      Condition at Discharge: Improved Discharge Medications:    Your Medication List     STOP taking these medications    diphenhydrAMINE  50 mg capsule Commonly known as: BENADRYL    diphenhydrAMINE  50 MG tablet Commonly known as: BENADRYL        START taking these medications    oxyCODONE 5 MG immediate release tablet Commonly known as: ROXICODONE Take 1 tablet (5 mg total) by mouth every four (4) hours as needed for up to 5 doses.       CHANGE how you take these medications    aspirin  81 MG tablet Commonly known as: ECOTRIN TAKE ONE TABLET BY MOUTH ONCE DAILY What changed: Another medication with the same name was added. Make sure you understand how and when to take each.   aspirin  81 MG chewable tablet Chew 1 tablet (81 mg total) daily. What changed: You  were already taking a medication with the same name, and this prescription was added. Make sure you understand how and when to take each.   predniSONE 50 MG tablet Commonly known as: DELTASONE Take 1 tablet (50 mg total) by mouth Take as directed. Give 13 hours, 7 hours and 1 hour before contrast procedure. What changed: Another medication with the same name was removed. Continue taking this medication, and follow the directions you see here.       CONTINUE taking these medications    alcohol swabs Padm Apply 1 each topically Four (4) times a day.   aluminum hydrox-magnesium carb 160-105 mg Chew Commonly known as: GAVISCON EX Chew 2 tablets Four (4) times a day with a meal and nightly.   atorvastatin  80 MG tablet Commonly known as: LIPITOR  Take 1 tablet (80 mg total) by mouth daily.   blood sugar diagnostic Strp Dispense 100 blood glucose test strips, ok to sub any brand preferred by insurance/patient to match with meter,  use 5x/day   blood-glucose meter kit Disp. blood glucose meter kit preferred by patient's insurance. Check blood sugars as directed by provider. Dx: Diabetes, E11.9   blood-glucose,receiver,cont Misc Commonly known as: DEXCOM G6 RECEIVER Dispense 1 Dexcom G6 receiver   DEXCOM G7 RECEIVER Misc Generic drug: blood-glucose,receiver,cont Use as instructed with G7 sensors   cinacalcet  90 MG tablet Commonly known as: SENSIPAR  Take 1 tablet (90 mg total) by mouth daily.   clopidogrel  75 mg tablet Commonly known as: PLAVIX  Take 1 tablet (75 mg total) by mouth daily.   DEXCOM G6 SENSOR Devi Generic drug: blood-glucose sensor Dispense 1 Dexcom G6 sensor every 10 days   DEXCOM G6 TRANSMITTER Devi Generic drug: blood-glucose transmitter Dispense 1 Dexcom G6 transmitter every 90 days   diclofenac sodium 1 % gel Commonly known as: VOLTAREN Apply 4 g topically four (4) times a day as needed for arthritis (knee pain).   ezetimibe  10 mg tablet Commonly known  as: ZETIA  Take 1 tablet (10 mg total) by mouth daily.   ferric citrate  210 mg iron  Tab tablet Commonly known as: AURYXIA  Take two tablets by mouth with meals, three times a day. Swallow whole.  Do not chew or crush medication.   gabapentin  300 MG capsule Commonly known as: NEURONTIN  Take 2 capsules (600 mg total) by mouth nightly.   glucagon spray 3 mg/actuation Spry Use 1 spray in 1 nostril for severe hypoglycemia, as per package instructions   HYDROcodone-acetaminophen  5-325 mg per tablet Commonly known as: NORCO   insulin  lispro 100 unit/mL injection Commonly known as: HumaLOG Vials to be used in the pump to allow up to 50 units per day.   insulin  syringe-needle U-100 0.5 mL 31 gauge x 5/16 (8 mm) Syrg Use with insulin  Vials, 4 times per day before each meal or as needed to correct sugar.   isosorbide  mononitrate 30 MG 24 hr tablet Commonly known as: IMDUR  Take 1 tablet (30 mg total) by mouth daily.   lancets Misc use to test blood sugar Four (4) times a day with a meal and nightly.   lancets Misc Use to check blood sugar, up to 5 times a day   metoPROLOL  succinate 100 MG 24 hr tablet Commonly known as: TOPROL  XL Take 1 tablet (100 mg total) by mouth daily.   mirtazapine  30 MG tablet Commonly known as: REMERON  Take 1 tablet (30 mg total) by mouth nightly.   mupirocin 2 % ointment Commonly known as: BACTROBAN Apply topically two (2) times a day. Apply on open areas on the legs until they heal in   OMNIPOD 5 G6-G7 INTRO KT(GEN5) Crtg Generic drug: insulin  pmp cart,aut,G6/7,cntr INTRO KIT CONTAIN CONTROLLER AND PODS, TO ALLOW POD CHANGE EVERY 2 DAYS   OMNIPOD 5 G6-G7 PODS (GEN 5) Crtg Generic drug: insulin  pump cart,auto,BT,G6/7 Change pod every 2 days. Pod must be compatible with Dexcom G 6 and G 7.   OMNIPOD CLASSIC PODS (GEN 3) Crtg Generic drug: insulin  pump cart,cont inf,RF Use 1 Pod every 3 days   pantoprazole  40 MG tablet Commonly known as:  Protonix  Take 1 tablet (40 mg total) by mouth daily before breakfast.   pen needle, diabetic 32 gauge x 5/32 (4 mm) Ndle Commonly known as: UNIFINE PENTIPS Use to give insulin  5 times a day   sertraline  100 MG tablet Commonly known as: ZOLOFT  Take 1.5 tablets (150 mg total) by mouth daily.   sucralfate 1 gram tablet Commonly known as: CARAFATE Take 1 tablet (1 g total)  by mouth four (4) times a day as needed (reflux).   tizanidine 2 MG tablet Commonly known as: ZANAFLEX Take 1 tablet (2 mg total) by mouth every six (6) hours as needed.   tizanidine 2 MG tablet Commonly known as: ZANAFLEX TAKE 2 TABLETS BY MOUTH TWICE DAILY AS NEEDED FOR MUSCLE SPASM   triamcinolone 0.1 % ointment Commonly known as: KENALOG Apply topically two (2) times a day. Put on the scabby areas on the legs   VELTASSA  8.4 gram powder packet Generic drug: patiromer  calcium  sorbitex Take 1 packet daily on non-dialysis days only       ASK your doctor about these medications    MIRCERA INJ 30 mcg. Ask about: Should I take this medication?        Discharge Instructions: Other Instructions     Discharge instructions     Activity Instructions   Post arteriogram instructions:  Rest for the next 24 hours: Take stairs leading with the unaffected leg. Avoid strenuous activity and heavy lifting (greater than 10 pounds) for 1 week. Do not submerge site until healed. No tub baths, swimming pools, etc.. until puncture site healed (typically within 5 days).  Showers ok.  If site bleeds, lay flat and hold firm pressure at site until bleeding stops.  For rapid, uncontrolled bleeding, hold firm pressure and call 911.   Continue to hold pressure until help arrives.    Dressing stays intact for 2 days. After two days, remove dressing and gently wash site.  Dry and apply new clean dressing if needed.  Wash site and change dressing daily until healed.  You may return to your usual diet unless  otherwise directed.   Please call [facility] at [phone] to speak to a nurse for any problems or questions following discharge including:  Bleeding that saturates bandage Pain at site not controlled by regular pain medicines Signs of infection at site: redness, swelling, pus draining, fever above 101.33F Any blue or numb fingers or toes.   Calls received during normal business hours (Monday through Friday from 8:00 am to 4:30 pm) will be returned within one business day. Calls received after normal business hours OR on weekends will be returned during the next business day.   If this is an after-hours urgent matter, you can call the Hospital front desk at [phone] and ask to speak with the Vascular Surgery doctor on call.  If you are experiencing a medical emergency, please call 911 or go to your nearest emergency room.       Labs and Other Follow-ups after Discharge: Follow Up instructions and Outpatient Referrals    Discharge instructions       Future Appointments: Appointments which have been scheduled for you    Jan 31, 2024 12:30 PM (Arrive by 12:15 PM) RETURN VASCULAR with Lawayne Patience, MD [facility] HEART VASCULAR CTR SURGERY [place] [facility] REG[place]) [address] [place] [address] [phone]     Feb 04, 2024 1:20 PM (Arrive by 1:05 PM) NEW WOUND with Brian Zaguirre Rayala, MD [facility] HEART VASCULAR CENTER WOUND [place] [facility] REGION) [address] [place] [address] [phone]     Feb 05, 2024 8:20 AM (Arrive by 8:05 AM) NEW  FOOT AND ANKLE with Alan Agnes [name], PA [facility] [place]) [address] [place]  [address] [phone]     Feb 19, 2024 10:00 AM (Arrive by 9:45 AM) DIABETES EDUCATION with Rosario Na [facility] DIABETES AND ENDOCRINOLOGY [place] [facility] REGION) [address] [facility] [address] [place] [address] [phone]     Feb 21, 2024 2:00 PM (Arrive by 1:45 PM) VASCULAR ULTRASOUND ARTERIAL DUPLEX LOWER EXTREMITY BILATERAL with MM PVL RM 1 IMG PVL [place] [facility] [place]) [address] [place] [address] [phone]     Feb 21, 2024 3:00 PM (Arrive by 2:45 PM) RETURN VASCULAR with Lawayne Patience, MD [facility] HEART VASCULAR CTR SURGERY [place] [facility] REGION) [address] [place] [address] [phone]     Feb 28, 2024 1:00 PM (Arrive by 12:45 PM) VASCULAR ULTRASOUND HEMODIALYSIS GRAFT FISTULA DUPLEX with MM PVL RM 2 IMG PVL [place] ([facility] [place]) [address] [place] [address] [phone]     Feb 28, 2024 2:00 PM (Arrive by 1:45 PM) VASCULAR ULTRASOUND ARTERIAL DUPLEX LOWER EXTREMITY BILATERAL with MM PVL RM 2 IMG PVL [place] ([facility] [place]) [address] [place] [address] [phone]     Feb 28, 2024 3:15 PM (Arrive by 3:00 PM) RETURN VASCULAR with Lawayne Patience, MD [facility] HEART VASCULAR CTR SURGERY [place] [facility] REGION) [address] [place] [address] [phone]     May 07, 2024 11:00 AM (Arrive by 10:45 AM) TOXIN ADULT with Norleen Ozell Punch, MD [facility] PHYSICAL MEDICINE [facility] [address] [place] [facility] REGION) [phone] [place] [address] [phone]     May 21, 2024 2:50 PM (Arrive by 2:35 PM) RETURN DIABETES with Georgia Jury, MD [facility] DIABETES AND ENDOCRINOLOGY [place] [facility] REGION) [address] [facility] [address] [place]  [address] [phone]

## 2024-02-04 NOTE — Progress Notes (Signed)
 [facility] FAMILY MEDICINE [place] POPULATION HEALTH Care Management Progress Note  Date: 02/04/2024 Outcome:  1st outreach attempt, left voicemail. CM will make further attempts at outreach.   Purpose of contact:  CCM       Attempted to reach patient. Unable to reach patient, left a VM.   Planned to discuss: -ED visit/ check in  Additional Information/Plan: Patient provided my direct contact information and encouraged to contact me should additional needs arise.  Time Spent Per Day: Chart review was completed prior to outreach attempt.  02/04/2024: 8  AMANI PETWAY, CMA Population Health [facility] FAMILY MEDICINE [place]

## 2024-02-05 NOTE — Progress Notes (Signed)
 [facility]opaedic [facility]le [facility] Encounter Provider: Alan VEAR [name], PA Date of Service: 02/05/2024   Primary Care Provider: [name], DO Referring Provider: [name]   ICD-10-CM  1. Closed nondisplaced spiral fracture of shaft of left tibia, initial encounter  S82.245A   Orthopaedic notes: No specialty comments available. PROMIS Physical Function CAT:    PROMIS Pain Interference CAT:     History: No chief complaint on file. (Data reviewed and verified by encounter provider.) HPI: 38 y.o. male with complex vascular history presents for evaluation of a left distal tibia fracture sustained 01/15/24 after he fell out of a shower chair.  He was admitted to the ER on 01/21/24 primarily for vascular work up. He has a prior fem-PT bypass in 2017 now with bypass occlusion and LLE wounds.  He is scheduled for further PVL studies in 3 days and has a follow up with vascular surgery on 02/21/24.  The tentative plan per vasculars consult note is for AKA outpatient.  He is complaining of left lower leg pain and spasms.  He is currently immobilized in a short leg plaster splint.      Medical History Past Medical History[1]  Surgical History Past Surgical History[2]  Allergies Baclofen, Camellia oleifera seed oil, Iodinated contrast media, Lisinopril, and Oxybenzone(benzophenone 3)  Medications He has a current medication list which includes the following prescription(s): alcohol swabs, aluminum hydrox-magnesium carb, aspirin , aspirin , atorvastatin , blood sugar diagnostic, blood-glucose meter, blood-glucose,receiver,cont, dexcom g7 receiver, dexcom g6 sensor, dexcom g6 transmitter, cinacalcet , clopidogrel , diclofenac sodium, ezetimibe , ferric citrate , gabapentin , glucagon spray, hydrocodone-acetaminophen , insulin  lispro, omnipod 5 g6-g7 intro kt(gen5), omnipod 5 g6-g7 pods (gen 5), insulin  syringe-needle u-100, isosorbide  mononitrate, lancets, lancets, metoprolol  succinate,  mirtazapine , mupirocin, omnipod classic pods (gen 3), oxycodone, pantoprazole , patiromer  calcium  sorbitex, pen needle, diabetic, prednisone, sertraline , sucralfate, tizanidine, tizanidine, and triamcinolone.  Family History His family history includes COPD in his paternal grandfather; Cancer in his paternal uncle; Diabetes in his maternal grandmother and paternal grandmother; Diabetes type I in an other family member; Heart disease in his paternal grandfather; Hypertension in his maternal grandmother, paternal grandmother, and another family member; Kidney disease in an other family member; Lung cancer in an other family member.  Social History He reports that he has been smoking cigars and cigarettes. He started smoking about 6 years ago. He has been exposed to tobacco smoke. He has never used smokeless tobacco. He reports current drug use. Drug: Marijuana. He reports that he does not drink alcohol.Home [address] [place] [address] Occupation:      Occupational History  . Not on file   Social History   Social History Narrative  . Not on file       Exam: The encounter diagnosis was Closed nondisplaced spiral fracture of shaft of left tibia, initial encounter.  Estimated body mass index is 21.56 kg/m as calculated from the following:   Height as of 01/29/24: 175.3 cm (5' 9).   Weight as of 01/29/24: 66.2 kg (146 lb).   Musculoskeletal  Left lower leg  Image in chart of his LLE wounds.  Tenderness at the tibia fracture site.  Patient unwilling to move his toes        Pulses Unable to palpate    Swelling mild swelling   Neurologic Sensation to light touch distally diminished  Skin Image in chart    Physical Exam     Test Results The encounter diagnosis was Closed nondisplaced spiral fracture of shaft  of left tibia, initial encounter. Lab Results  Component Value Date   A1C 7.6 (H) 12/11/2023     No results found for: VITD   Imaging No orders of the  defined types were placed in this encounter.  Previous imaging was personally reviewed and interpreted by the encounter provider today..    Reviewed imaging with Dr. Sharlie Homer Haldon Lamb is a 38 y.o. male  ASSESSMENT  Presents for evaluation of left tibia shaft fracture sustained 01/15/24 with complex vascular co morbidities.     ICD-10-CM  1. Closed nondisplaced spiral fracture of shaft of left tibia, initial encounter  S82.245A   Orthopaedic notes: No specialty comments available. PROMIS Physical Function CAT:    PROMIS Pain Interference CAT:    Raymond Lamb?pid=[phone]     PLAN:     Discussed the case with Dr. Tisa.  Recommendation is for immobilization in a CAM boot and knee immobilizer.  He is scheduled for PVL studies this Thursday and requires the immobilization to be removed for these studies.  Vascular consult while he was in the hospital had recommended AKA in the outpatient setting.  He plans to follow up with them at the end of the month.  Pending vasculars recommendation, we will follow up with him on Sept. 2nd with repeat x-ray of the left tibia at that time. If vascular plans to do another bypass and not amputate his leg then we will continue conservative treatment for the tibia fracture with continued immobilization.  He was prescribed flexeril  for the muscle spasms and instructed to discontinue taking the tizanadine.    Requested Prescriptions    No prescriptions requested or ordered in this encounter    No orders of the defined types were placed in this encounter.   MDM (Two highest determine E&M code)  Problems 1 or more chronic illnesses with exacerbation, progression, or side effects of treatment (00795, 99214)  Data [Use LOS Wand to tabulate number of outside records reviewed, ordering tests, reviewing tests; use of independent historian; discussion with other provider]  Risk Moderate risk of morbidity from  additional diagnostic testing or treatment (99204/99214)        [1] Past Medical History: Diagnosis Date  . Cardiac arrest    05/2015  . Cardiomyopathy      . Cardiovascular disease   . Congenital heart disease   . DKA (diabetic ketoacidoses) 04/20/2016  . DVT (deep venous thrombosis)      . ESRD (end stage renal disease) on dialysis       T Th Sat  . GERD (gastroesophageal reflux disease) 08/03/2021  . Hemiplegia and hemiparesis following cerebral infarction affecting left non-dominant side    08/08/2022  . HIV disease      . Hypertension   . Jaundice   . Myocardial infarction      . Peripheral arterial occlusive disease   . Pyogenic inflammation of bone    02/17/2016  . Sleep apnea, obstructive   . Stroke    05/2015   left sided weakness, gets around with a wheelchair since stroke  . Type 1 diabetes       Dignosed age 69. 2 episodes of DKA in past  . Zoster ophthalmicus 04/13/2020  [2] Past Surgical History: Procedure Laterality Date  . AV FISTULA REPAIR  01/06/2019   Ballon angioplasty of stenotic basillic vein  . NASAL FRACTURE SURGERY     as teenager  . PR AMPUTATION FOOT,TRANSMETATARSAL Left 03/23/2016   Procedure: AMPUTATION, FOOT; TRANSMETATARSAL;  Surgeon: Selinda Bernardino Baston, MD;  Location: MAIN OR [facility];  Service: Vascular  . PR AMPUTATION METATARSAL+TOE,SINGLE Left 01/14/2016   Procedure: AMPUTATION, METATARSAL, WITH TOE SINGLE. L 4th toe;  Surgeon: Lawayne Patience, MD;  Location: MAIN OR [facility];  Service: Vascular  . PR AV ANAST,FOREARM VEIN TRANSPOSITION Left 05/08/2016   Procedure: Left Radial Cephalic fisutal vs Brachial Cephalic Fistula;  Surgeon: Lawayne Patience, MD;  Location: MAIN [facility];  Service: Vascular  . PR CATH PLACE/CORON ANGIO, IMG SUPER/INTERP,W LEFT HEART VENTRICULOGRAPHY N/A 12/21/2015   Procedure: Left Heart Catheterization;  Surgeon: Suella Finders, MD;  Location: [facility] CATH;  Service: Cardiology  . PR CATH PLACE/CORON ANGIO, IMG SUPER/INTERP,W  LEFT HEART VENTRICULOGRAPHY N/A 11/18/2020   Procedure: Left Heart Catheterization;  Surgeon: Norleen Deward Grumbling, MD;  Location: [facility] CATH;  Service: Cardiology  . PR DEBRIDEMENT BONE 1ST 20 SQ CM/< Left 03/23/2016   Procedure: DEBRIDEMENT; SKIN, SUBCUTANEOUS TISSUE, MUSCLE, & BONE FOOT;  Surgeon: Selinda Bernardino Baston, MD;  Location: MAIN OR [facility];  Service: Vascular  . PR VEIN BYPASS GRAFT,FEM-TIBIAL Left 01/03/2016   Procedure: Fem-Peroneal Bypass with Ipsilateral GSV;  Surgeon: Lawayne Patience, MD;  Location: MAIN OR [facility];  Service: Vascular  . RSFA stent

## 2024-02-07 NOTE — Progress Notes (Signed)
 Reason for Clinic Visit: Follow up PAD, ESRD  Raymond Lamb is a 38 y.o. male with history of LUE DVT, T1DM, HTN, anoxic brain injury 05/2015, and NSTEMI, who returns to clinic for ESRD on HD s/p LUE radiocephalic AVF creation 04/2016 and BLE PAD s/p RSFA stenting 07/2015, s/p L fem-PTA bypass with vein 12/2015.   Patient has LLE angio 01/29/24. Today, he reports pain to LLE. He has fracture and is casted. He reports he fell in the bathtub causing the fracture. He has leg bent at the knee to help reduce pain, when he extends the leg, pain is exacerbated.   WIfI Classification    Wound:  Grade 1 - Small, shallow ulcer(s) on distal leg or foot; no exposed bone, unless limited to distal phalanx; No gangrene   Ischemia:  Grade 3 - ABI =0.39, ankle systolic pressure <50 mmHg, TP/TcPO2 <30  Foot Infection:  Grade 0 - No symptoms or signs of infection  Interventions to date: - Angioplasty and stenting of RSFA for BLE CLI for rest pain with Dr. 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 Vavalle 11/18/21  Past Medical History:  Diagnosis Date  . Cardiac arrest     05/2015  . Cardiomyopathy       . Cardiovascular disease   . Congenital heart disease (HHS-HCC)   . DKA (diabetic ketoacidoses) 04/20/2016  . DVT (deep venous thrombosis)       . ESRD (end stage renal disease) on dialysis        T Th Sat  . GERD (gastroesophageal reflux disease) 08/03/2021  . Hemiplegia and hemiparesis following cerebral infarction affecting left non-dominant side     08/08/2022  . HIV disease       . Hypertension   . Jaundice   . Myocardial infarction       . Peripheral arterial occlusive disease   . Pyogenic inflammation of bone     02/17/2016  . Sleep apnea, obstructive   . Stroke     05/2015   left sided  weakness, gets around with a wheelchair since stroke  . Type 1 diabetes        Dignosed age 68. 2 episodes of DKA in past  . Zoster ophthalmicus 04/13/2020   P.E. Vitals:   01/31/24 1437  BP: 159/79  Pulse: 97  Temp: 36.7 C (98.1 F)    38 y.o. in NAD Psych : A,A Ox 3 Neuro: Grossly Intact Neck: Supple, no evidence of JVD , no evidence of thyromegaly Chest : CTAB Heart: RRR, No M/G/R Abdomen: soft, NT,ND BS+ Upper extremities : warm and well perfused, 2+ radial artery pulses. Palpable thrill to AVF.  Lower Extremities : warm, well perfused. Multiple well circumscribed, excoriated lesions to BLE and toes. Pulses: dopplerable signals bilaterally. Peroneal and DP on the R and PT on the L.   LLE contracture with cast to lower leg.    Assessment: 38 y.o. male returns to clinic for follow-up of PAD. LLE angio was performed 01/29/24, there is a potent bypass target in the mid PTA, as the CFA to PTA bypass is occluded with long-segment occlusion of SFA. We discussed options for treatment to include bypass, conservative management, and possible BKA. After discussion with mother and patient, they would like to try to save the leg with  bypass. Will plan for vein mapping next week to decide what conduit would be most suitable for surgery. He has left distal tibia fracture, currently his leg is contracted at the knee and has a cast in placed. Will need him to be able to straighten leg for surgery. He hs appointment with ortho next week for possible case removal.   Plan: RTC next week with vessel mapping, will call with results.   Time Spent: 45 minutes spent face-to-face counseling and coordination of care and greater than 50% of the visit was spent face-to-face.   I saw and evaluated the patient, participating in the key portions of the service.  I reviewed the note.  I agree with the findings and plan.   Lawayne Patience, MD Professor of Surgery Division of Vascular Surgery Pager :  [phone]

## 2024-02-20 ENCOUNTER — Emergency Department

## 2024-02-20 ENCOUNTER — Inpatient Hospital Stay
Admission: EM | Admit: 2024-02-20 | Discharge: 2024-02-21 | DRG: 252 | Disposition: A | Discharge status: Home / Self Care | Attending: Obstetrics and Gynecology | Admitting: Obstetrics and Gynecology

## 2024-02-20 ENCOUNTER — Encounter
Admission: EM | Disposition: A | Discharge status: Home / Self Care | Payer: Self-pay | Source: Home / Self Care | Attending: Hospitalist

## 2024-02-20 ENCOUNTER — Other Ambulatory Visit: Payer: Self-pay

## 2024-02-20 DIAGNOSIS — Z91041 Radiographic dye allergy status: Secondary | ICD-10-CM | POA: Diagnosis not present

## 2024-02-20 DIAGNOSIS — R739 Hyperglycemia, unspecified: Secondary | ICD-10-CM | POA: Diagnosis present

## 2024-02-20 DIAGNOSIS — Z7982 Long term (current) use of aspirin: Secondary | ICD-10-CM

## 2024-02-20 DIAGNOSIS — Z8673 Personal history of transient ischemic attack (TIA), and cerebral infarction without residual deficits: Secondary | ICD-10-CM

## 2024-02-20 DIAGNOSIS — Z86718 Personal history of other venous thrombosis and embolism: Secondary | ICD-10-CM | POA: Diagnosis not present

## 2024-02-20 DIAGNOSIS — Z833 Family history of diabetes mellitus: Secondary | ICD-10-CM

## 2024-02-20 DIAGNOSIS — S82832K Other fracture of upper and lower end of left fibula, subsequent encounter for closed fracture with nonunion: Secondary | ICD-10-CM

## 2024-02-20 DIAGNOSIS — D631 Anemia in chronic kidney disease: Secondary | ICD-10-CM | POA: Diagnosis present

## 2024-02-20 DIAGNOSIS — E1065 Type 1 diabetes mellitus with hyperglycemia: Secondary | ICD-10-CM | POA: Diagnosis present

## 2024-02-20 DIAGNOSIS — E782 Mixed hyperlipidemia: Secondary | ICD-10-CM | POA: Diagnosis present

## 2024-02-20 DIAGNOSIS — Z888 Allergy status to other drugs, medicaments and biological substances status: Secondary | ICD-10-CM | POA: Diagnosis not present

## 2024-02-20 DIAGNOSIS — E1022 Type 1 diabetes mellitus with diabetic chronic kidney disease: Secondary | ICD-10-CM | POA: Diagnosis present

## 2024-02-20 DIAGNOSIS — I70222 Atherosclerosis of native arteries of extremities with rest pain, left leg: Secondary | ICD-10-CM | POA: Diagnosis not present

## 2024-02-20 DIAGNOSIS — W19XXXD Unspecified fall, subsequent encounter: Secondary | ICD-10-CM | POA: Diagnosis present

## 2024-02-20 DIAGNOSIS — T82858A Stenosis of vascular prosthetic devices, implants and grafts, initial encounter: Principal | ICD-10-CM | POA: Diagnosis present

## 2024-02-20 DIAGNOSIS — D72829 Elevated white blood cell count, unspecified: Secondary | ICD-10-CM | POA: Diagnosis present

## 2024-02-20 DIAGNOSIS — S82432G Displaced oblique fracture of shaft of left fibula, subsequent encounter for closed fracture with delayed healing: Secondary | ICD-10-CM

## 2024-02-20 DIAGNOSIS — N2581 Secondary hyperparathyroidism of renal origin: Secondary | ICD-10-CM | POA: Diagnosis present

## 2024-02-20 DIAGNOSIS — L97929 Non-pressure chronic ulcer of unspecified part of left lower leg with unspecified severity: Secondary | ICD-10-CM | POA: Diagnosis present

## 2024-02-20 DIAGNOSIS — I70223 Atherosclerosis of native arteries of extremities with rest pain, bilateral legs: Secondary | ICD-10-CM | POA: Diagnosis present

## 2024-02-20 DIAGNOSIS — I700 Atherosclerosis of aorta: Secondary | ICD-10-CM

## 2024-02-20 DIAGNOSIS — I2510 Atherosclerotic heart disease of native coronary artery without angina pectoris: Secondary | ICD-10-CM | POA: Diagnosis present

## 2024-02-20 DIAGNOSIS — I70248 Atherosclerosis of native arteries of left leg with ulceration of other part of lower left leg: Secondary | ICD-10-CM | POA: Diagnosis not present

## 2024-02-20 DIAGNOSIS — E1051 Type 1 diabetes mellitus with diabetic peripheral angiopathy without gangrene: Secondary | ICD-10-CM | POA: Diagnosis present

## 2024-02-20 DIAGNOSIS — Z79899 Other long term (current) drug therapy: Secondary | ICD-10-CM | POA: Diagnosis not present

## 2024-02-20 DIAGNOSIS — I739 Peripheral vascular disease, unspecified: Secondary | ICD-10-CM | POA: Diagnosis present

## 2024-02-20 DIAGNOSIS — Z993 Dependence on wheelchair: Secondary | ICD-10-CM

## 2024-02-20 DIAGNOSIS — S82492G Other fracture of shaft of left fibula, subsequent encounter for closed fracture with delayed healing: Secondary | ICD-10-CM

## 2024-02-20 DIAGNOSIS — Z95828 Presence of other vascular implants and grafts: Secondary | ICD-10-CM | POA: Diagnosis not present

## 2024-02-20 DIAGNOSIS — Z9889 Other specified postprocedural states: Secondary | ICD-10-CM

## 2024-02-20 DIAGNOSIS — I998 Other disorder of circulatory system: Principal | ICD-10-CM | POA: Diagnosis present

## 2024-02-20 DIAGNOSIS — Z89422 Acquired absence of other left toe(s): Secondary | ICD-10-CM

## 2024-02-20 DIAGNOSIS — S82242G Displaced spiral fracture of shaft of left tibia, subsequent encounter for closed fracture with delayed healing: Secondary | ICD-10-CM | POA: Diagnosis not present

## 2024-02-20 DIAGNOSIS — N186 End stage renal disease: Secondary | ICD-10-CM | POA: Diagnosis present

## 2024-02-20 DIAGNOSIS — I120 Hypertensive chronic kidney disease with stage 5 chronic kidney disease or end stage renal disease: Secondary | ICD-10-CM | POA: Diagnosis present

## 2024-02-20 DIAGNOSIS — Z7902 Long term (current) use of antithrombotics/antiplatelets: Secondary | ICD-10-CM

## 2024-02-20 DIAGNOSIS — F17210 Nicotine dependence, cigarettes, uncomplicated: Secondary | ICD-10-CM | POA: Diagnosis present

## 2024-02-20 DIAGNOSIS — I10 Essential (primary) hypertension: Secondary | ICD-10-CM | POA: Diagnosis present

## 2024-02-20 DIAGNOSIS — E10649 Type 1 diabetes mellitus with hypoglycemia without coma: Secondary | ICD-10-CM | POA: Diagnosis present

## 2024-02-20 DIAGNOSIS — Z794 Long term (current) use of insulin: Secondary | ICD-10-CM | POA: Diagnosis not present

## 2024-02-20 DIAGNOSIS — Z9641 Presence of insulin pump (external) (internal): Secondary | ICD-10-CM | POA: Diagnosis present

## 2024-02-20 DIAGNOSIS — Z992 Dependence on renal dialysis: Secondary | ICD-10-CM | POA: Diagnosis not present

## 2024-02-20 DIAGNOSIS — Y832 Surgical operation with anastomosis, bypass or graft as the cause of abnormal reaction of the patient, or of later complication, without mention of misadventure at the time of the procedure: Secondary | ICD-10-CM | POA: Diagnosis present

## 2024-02-20 DIAGNOSIS — S82302G Unspecified fracture of lower end of left tibia, subsequent encounter for closed fracture with delayed healing: Secondary | ICD-10-CM

## 2024-02-20 HISTORY — PX: LOWER EXTREMITY ANGIOGRAPHY: CATH118251

## 2024-02-20 LAB — TYPE AND SCREEN
ABO/RH(D): A POS
Antibody Screen: NEGATIVE

## 2024-02-20 LAB — CBC WITH DIFFERENTIAL/PLATELET
Abs Immature Granulocytes: 0.05 K/uL (ref 0.00–0.07)
Basophils Absolute: 0.1 K/uL (ref 0.0–0.1)
Basophils Relative: 1 %
Eosinophils Absolute: 0.7 K/uL — ABNORMAL HIGH (ref 0.0–0.5)
Eosinophils Relative: 6 %
HCT: 30.6 % — ABNORMAL LOW (ref 39.0–52.0)
Hemoglobin: 9.5 g/dL — ABNORMAL LOW (ref 13.0–17.0)
Immature Granulocytes: 0 %
Lymphocytes Relative: 20 %
Lymphs Abs: 2.4 K/uL (ref 0.7–4.0)
MCH: 29.6 pg (ref 26.0–34.0)
MCHC: 31 g/dL (ref 30.0–36.0)
MCV: 95.3 fL (ref 80.0–100.0)
Monocytes Absolute: 1 K/uL (ref 0.1–1.0)
Monocytes Relative: 9 %
Neutro Abs: 7.4 K/uL (ref 1.7–7.7)
Neutrophils Relative %: 64 %
Platelets: 273 K/uL (ref 150–400)
RBC: 3.21 MIL/uL — ABNORMAL LOW (ref 4.22–5.81)
RDW: 15.7 % — ABNORMAL HIGH (ref 11.5–15.5)
WBC: 11.7 K/uL — ABNORMAL HIGH (ref 4.0–10.5)
nRBC: 0 % (ref 0.0–0.2)

## 2024-02-20 LAB — COMPREHENSIVE METABOLIC PANEL WITH GFR
ALT: 13 U/L (ref 0–44)
AST: 15 U/L (ref 15–41)
Albumin: 3.4 g/dL — ABNORMAL LOW (ref 3.5–5.0)
Alkaline Phosphatase: 81 U/L (ref 38–126)
Anion gap: 17 — ABNORMAL HIGH (ref 5–15)
BUN: 49 mg/dL — ABNORMAL HIGH (ref 6–20)
CO2: 27 mmol/L (ref 22–32)
Calcium: 9.3 mg/dL (ref 8.9–10.3)
Chloride: 93 mmol/L — ABNORMAL LOW (ref 98–111)
Creatinine, Ser: 8.9 mg/dL — ABNORMAL HIGH (ref 0.61–1.24)
GFR, Estimated: 7 mL/min — ABNORMAL LOW (ref >60)
Glucose, Bld: 437 mg/dL — ABNORMAL HIGH (ref 70–99)
Potassium: 4.2 mmol/L (ref 3.5–5.1)
Sodium: 137 mmol/L (ref 135–145)
Total Bilirubin: 0.3 mg/dL (ref 0.0–1.2)
Total Protein: 7.6 g/dL (ref 6.5–8.1)

## 2024-02-20 LAB — CBG MONITORING, ED
Glucose-Capillary: 401 mg/dL — ABNORMAL HIGH (ref 70–99)
Glucose-Capillary: 276 mg/dL — ABNORMAL HIGH (ref 70–99)
Glucose-Capillary: 214 mg/dL — ABNORMAL HIGH (ref 70–99)

## 2024-02-20 LAB — GLUCOSE, CAPILLARY
Glucose-Capillary: 281 mg/dL — ABNORMAL HIGH (ref 70–99)
Glucose-Capillary: 396 mg/dL — ABNORMAL HIGH (ref 70–99)
Glucose-Capillary: 319 mg/dL — ABNORMAL HIGH (ref 70–99)
Glucose-Capillary: 291 mg/dL — ABNORMAL HIGH (ref 70–99)
Glucose-Capillary: 259 mg/dL — ABNORMAL HIGH (ref 70–99)

## 2024-02-20 LAB — PROTIME-INR
INR: 1 (ref 0.8–1.2)
Prothrombin Time: 14.2 s (ref 11.4–15.2)

## 2024-02-20 LAB — HEPARIN LEVEL (UNFRACTIONATED): Heparin Unfractionated: 0.42 [IU]/mL (ref 0.30–0.70)

## 2024-02-20 LAB — MAGNESIUM: Magnesium: 1.7 mg/dL (ref 1.7–2.4)

## 2024-02-20 LAB — APTT: aPTT: 51 s — ABNORMAL HIGH (ref 24–36)

## 2024-02-20 LAB — HIV ANTIBODY (ROUTINE TESTING W REFLEX): HIV Screen 4th Generation wRfx: NONREACTIVE

## 2024-02-20 SURGERY — LOWER EXTREMITY ANGIOGRAPHY
Anesthesia: Moderate Sedation | Laterality: Left

## 2024-02-20 MED ORDER — DIPHENHYDRAMINE HCL 50 MG/ML IJ SOLN
INTRAMUSCULAR | Status: AC
  Administered 2024-02-20: 50 mg via INTRAVENOUS
  Filled 2024-02-20: qty 1

## 2024-02-20 MED ORDER — SERTRALINE HCL 50 MG PO TABS
100.0000 mg | ORAL_TABLET | Freq: Every morning | ORAL | Status: DC
  Administered 2024-02-21: 100 mg via ORAL
  Filled 2024-02-20: qty 2

## 2024-02-20 MED ORDER — ONDANSETRON HCL 4 MG/2ML IJ SOLN
4.0000 mg | Freq: Once | INTRAMUSCULAR | Status: AC
  Administered 2024-02-20: 4 mg via INTRAVENOUS
  Filled 2024-02-20: qty 2

## 2024-02-20 MED ORDER — METOPROLOL SUCCINATE ER 50 MG PO TB24
100.0000 mg | ORAL_TABLET | Freq: Every day | ORAL | Status: DC
Start: 2024-02-20 — End: 2024-02-21
  Administered 2024-02-20 – 2024-02-21 (×2): 100 mg via ORAL
  Filled 2024-02-20 (×2): qty 2

## 2024-02-20 MED ORDER — DIPHENHYDRAMINE HCL 50 MG/ML IJ SOLN: 50.0000 mg | Freq: Once | INTRAMUSCULAR | Status: AC

## 2024-02-20 MED ORDER — FENTANYL CITRATE PF 50 MCG/ML IJ SOSY
PREFILLED_SYRINGE | INTRAMUSCULAR | Status: AC
  Filled 2024-02-20: qty 1

## 2024-02-20 MED ORDER — INSULIN ASPART 100 UNIT/ML IJ SOLN
INTRAMUSCULAR | Status: AC
  Filled 2024-02-20: qty 1

## 2024-02-20 MED ORDER — CEFAZOLIN SODIUM-DEXTROSE 2-4 GM/100ML-% IV SOLN
2.0000 g | INTRAVENOUS | Status: DC
  Filled 2024-02-20: qty 100

## 2024-02-20 MED ORDER — IOHEXOL 350 MG/ML SOLN
100.0000 mL | Freq: Once | INTRAVENOUS | Status: AC | PRN
  Administered 2024-02-20: 100 mL via INTRAVENOUS

## 2024-02-20 MED ORDER — MIDAZOLAM HCL 2 MG/2ML IJ SOLN
INTRAMUSCULAR | Status: AC
  Filled 2024-02-20: qty 2

## 2024-02-20 MED ORDER — CYCLOBENZAPRINE HCL 10 MG PO TABS: 10.0000 mg | ORAL_TABLET | ORAL | Status: DC | PRN

## 2024-02-20 MED ORDER — METHYLPREDNISOLONE SODIUM SUCC 125 MG IJ SOLR
INTRAMUSCULAR | Status: AC
  Administered 2024-02-20: 125 mg via INTRAVENOUS
  Filled 2024-02-20: qty 2

## 2024-02-20 MED ORDER — MORPHINE SULFATE (PF) 2 MG/ML IV SOLN
2.0000 mg | INTRAVENOUS | Status: DC | PRN
  Administered 2024-02-20 (×2): 2 mg via INTRAVENOUS

## 2024-02-20 MED ORDER — ONDANSETRON HCL 4 MG PO TABS: 4.0000 mg | ORAL_TABLET | Freq: Four times a day (QID) | ORAL | Status: DC | PRN

## 2024-02-20 MED ORDER — ONDANSETRON HCL 4 MG/2ML IJ SOLN
4.0000 mg | Freq: Four times a day (QID) | INTRAMUSCULAR | Status: DC | PRN
Start: 2024-02-20 — End: 2024-02-21

## 2024-02-20 MED ORDER — METHYLPREDNISOLONE SODIUM SUCC 125 MG IJ SOLR: 125.0000 mg | Freq: Once | INTRAMUSCULAR | Status: AC | PRN

## 2024-02-20 MED ORDER — INSULIN ASPART 100 UNIT/ML IJ SOLN
3.0000 [IU] | Freq: Three times a day (TID) | INTRAMUSCULAR | Status: DC
  Filled 2024-02-20: qty 1

## 2024-02-20 MED ORDER — METHOCARBAMOL 1000 MG/10ML IJ SOLN
500.0000 mg | Freq: Once | INTRAMUSCULAR | Status: AC
  Administered 2024-02-20: 500 mg via INTRAVENOUS
  Filled 2024-02-20: qty 5

## 2024-02-20 MED ORDER — HEPARIN SODIUM (PORCINE) 1000 UNIT/ML IJ SOLN
INTRAMUSCULAR | Status: AC
  Filled 2024-02-20: qty 10

## 2024-02-20 MED ORDER — INSULIN ASPART 100 UNIT/ML IJ SOLN
5.0000 [IU] | Freq: Once | INTRAMUSCULAR | Status: AC
  Administered 2024-02-20: 5 [IU] via SUBCUTANEOUS
  Filled 2024-02-20: qty 5

## 2024-02-20 MED ORDER — INSULIN ASPART 100 UNIT/ML IJ SOLN
10.0000 [IU] | Freq: Once | INTRAMUSCULAR | Status: AC
  Administered 2024-02-20: 10 [IU] via SUBCUTANEOUS

## 2024-02-20 MED ORDER — HEPARIN (PORCINE) 25000 UT/250ML-% IV SOLN
1150.0000 [IU]/h | INTRAVENOUS | Status: DC
  Administered 2024-02-20: 1150 [IU]/h via INTRAVENOUS
  Filled 2024-02-20: qty 250

## 2024-02-20 MED ORDER — LIDOCAINE HCL (PF) 1 % IJ SOLN
INTRAMUSCULAR | Status: DC | PRN
  Administered 2024-02-20: 10 mL

## 2024-02-20 MED ORDER — INSULIN ASPART 100 UNIT/ML IJ SOLN
8.0000 [IU] | Freq: Once | INTRAMUSCULAR | Status: AC
  Administered 2024-02-20: 8 [IU] via SUBCUTANEOUS

## 2024-02-20 MED ORDER — MORPHINE SULFATE (PF) 4 MG/ML IV SOLN
4.0000 mg | Freq: Once | INTRAVENOUS | Status: AC
  Administered 2024-02-20: 4 mg via INTRAVENOUS
  Filled 2024-02-20: qty 1

## 2024-02-20 MED ORDER — ISOSORBIDE MONONITRATE ER 30 MG PO TB24
30.0000 mg | ORAL_TABLET | Freq: Every day | ORAL | Status: DC
  Administered 2024-02-20 – 2024-02-21 (×2): 30 mg via ORAL
  Filled 2024-02-20 (×2): qty 1

## 2024-02-20 MED ORDER — MIDAZOLAM HCL 2 MG/2ML IJ SOLN
INTRAMUSCULAR | Status: DC | PRN
  Administered 2024-02-20 (×4): 1 mg via INTRAVENOUS

## 2024-02-20 MED ORDER — INSULIN GLARGINE 100 UNITS/ML SOLOSTAR PEN: 7.0000 [IU] | PEN_INJECTOR | Freq: Every day | SUBCUTANEOUS | Status: DC

## 2024-02-20 MED ORDER — HYDROMORPHONE HCL 1 MG/ML IJ SOLN
INTRAMUSCULAR | Status: DC | PRN
  Administered 2024-02-20: .5 mg via INTRAVENOUS

## 2024-02-20 MED ORDER — HYDRALAZINE HCL 20 MG/ML IJ SOLN
INTRAMUSCULAR | Status: AC
Start: 2024-02-20 — End: 2024-02-20
  Filled 2024-02-20: qty 1

## 2024-02-20 MED ORDER — PANTOPRAZOLE SODIUM 40 MG PO TBEC
40.0000 mg | DELAYED_RELEASE_TABLET | Freq: Every day | ORAL | Status: DC
  Administered 2024-02-20 – 2024-02-21 (×2): 40 mg via ORAL
  Filled 2024-02-20 (×2): qty 1

## 2024-02-20 MED ORDER — CEFAZOLIN SODIUM-DEXTROSE 2-4 GM/100ML-% IV SOLN: 2.0000 g | Freq: Once | INTRAVENOUS | Status: DC

## 2024-02-20 MED ORDER — FAMOTIDINE 20 MG PO TABS: 40.0000 mg | ORAL_TABLET | Freq: Once | ORAL | Status: AC | PRN

## 2024-02-20 MED ORDER — DIPHENHYDRAMINE HCL 25 MG PO CAPS: 50.0000 mg | ORAL_CAPSULE | Freq: Once | ORAL | Status: AC

## 2024-02-20 MED ORDER — METHYLPREDNISOLONE SODIUM SUCC 40 MG IJ SOLR
40.0000 mg | Freq: Once | INTRAMUSCULAR | Status: AC
  Administered 2024-02-20: 40 mg via INTRAVENOUS
  Filled 2024-02-20: qty 1

## 2024-02-20 MED ORDER — INSULIN ASPART 100 UNIT/ML IJ SOLN
0.0000 [IU] | Freq: Three times a day (TID) | INTRAMUSCULAR | Status: DC
  Administered 2024-02-20: 2 [IU] via SUBCUTANEOUS
  Administered 2024-02-21: 6 [IU] via SUBCUTANEOUS
  Filled 2024-02-20 (×2): qty 1
  Filled 2024-02-20: qty 2

## 2024-02-20 MED ORDER — MIRTAZAPINE 15 MG PO TABS
30.0000 mg | ORAL_TABLET | Freq: Every day | ORAL | Status: DC
  Administered 2024-02-20: 30 mg via ORAL
  Filled 2024-02-20: qty 2

## 2024-02-20 MED ORDER — ACETAMINOPHEN 650 MG RE SUPP
650.0000 mg | Freq: Four times a day (QID) | RECTAL | Status: DC | PRN
Start: 2024-02-20 — End: 2024-02-21

## 2024-02-20 MED ORDER — SODIUM CHLORIDE 0.9 % IV SOLN: INTRAVENOUS | Status: DC

## 2024-02-20 MED ORDER — HEPARIN BOLUS VIA INFUSION
5000.0000 [IU] | Freq: Once | INTRAVENOUS | Status: AC
  Administered 2024-02-20: 5000 [IU] via INTRAVENOUS
  Filled 2024-02-20: qty 5000

## 2024-02-20 MED ORDER — HEPARIN (PORCINE) 25000 UT/250ML-% IV SOLN
1300.0000 [IU]/h | INTRAVENOUS | Status: DC
  Administered 2024-02-20: 1150 [IU]/h via INTRAVENOUS
  Filled 2024-02-20: qty 250

## 2024-02-20 MED ORDER — CEFAZOLIN SODIUM-DEXTROSE 1-4 GM/50ML-% IV SOLN
INTRAVENOUS | Status: AC
Start: 2024-02-20 — End: 2024-02-20
  Filled 2024-02-20: qty 50

## 2024-02-20 MED ORDER — HYDROMORPHONE HCL 1 MG/ML IJ SOLN
INTRAMUSCULAR | Status: AC
  Filled 2024-02-20: qty 1

## 2024-02-20 MED ORDER — EZETIMIBE 10 MG PO TABS
10.0000 mg | ORAL_TABLET | Freq: Every day | ORAL | Status: DC
  Administered 2024-02-21: 10 mg via ORAL
  Filled 2024-02-20 (×2): qty 1

## 2024-02-20 MED ORDER — HYDRALAZINE HCL 50 MG PO TABS
50.0000 mg | ORAL_TABLET | Freq: Three times a day (TID) | ORAL | Status: DC
  Administered 2024-02-20 – 2024-02-21 (×3): 50 mg via ORAL
  Filled 2024-02-20 (×3): qty 1

## 2024-02-20 MED ORDER — HEPARIN (PORCINE) IN NACL 1000-0.9 UT/500ML-% IV SOLN
INTRAVENOUS | Status: DC | PRN
  Administered 2024-02-20: 1000 mL

## 2024-02-20 MED ORDER — POLYETHYLENE GLYCOL 3350 17 G PO PACK: 17.0000 g | PACK | Freq: Every day | ORAL | Status: DC | PRN

## 2024-02-20 MED ORDER — FENTANYL CITRATE (PF) 100 MCG/2ML IJ SOLN
INTRAMUSCULAR | Status: DC | PRN
  Administered 2024-02-20 (×4): 25 ug via INTRAVENOUS

## 2024-02-20 MED ORDER — HEPARIN SODIUM (PORCINE) 1000 UNIT/ML IJ SOLN
INTRAMUSCULAR | Status: DC | PRN
  Administered 2024-02-20: 4000 [IU] via INTRAVENOUS

## 2024-02-20 MED ORDER — FAMOTIDINE 20 MG PO TABS
ORAL_TABLET | ORAL | Status: AC
  Administered 2024-02-20: 40 mg via ORAL
  Filled 2024-02-20: qty 2

## 2024-02-20 MED ORDER — CEFAZOLIN SODIUM-DEXTROSE 1-4 GM/50ML-% IV SOLN
1.0000 g | Freq: Once | INTRAVENOUS | Status: AC
  Administered 2024-02-20: 1 g via INTRAVENOUS
  Filled 2024-02-20: qty 50

## 2024-02-20 MED ORDER — INSULIN ASPART 100 UNIT/ML IJ SOLN
0.0000 [IU] | Freq: Every day | INTRAMUSCULAR | Status: DC
  Administered 2024-02-20: 3 [IU] via SUBCUTANEOUS
  Filled 2024-02-20: qty 1

## 2024-02-20 MED ORDER — MORPHINE SULFATE (PF) 2 MG/ML IV SOLN
INTRAVENOUS | Status: AC
Start: 2024-02-20 — End: 2024-02-21
  Filled 2024-02-20: qty 1

## 2024-02-20 MED ORDER — FENTANYL CITRATE PF 50 MCG/ML IJ SOSY
PREFILLED_SYRINGE | INTRAMUSCULAR | Status: AC
Start: 2024-02-20 — End: 2024-02-20
  Filled 2024-02-20: qty 1

## 2024-02-20 MED ORDER — HYDRALAZINE HCL 20 MG/ML IJ SOLN
INTRAMUSCULAR | Status: DC | PRN
  Administered 2024-02-20: 10 mg via INTRAVENOUS

## 2024-02-20 MED ORDER — ACETAMINOPHEN 325 MG PO TABS: 650.0000 mg | ORAL_TABLET | Freq: Four times a day (QID) | ORAL | Status: DC | PRN

## 2024-02-20 MED ORDER — MORPHINE SULFATE (PF) 2 MG/ML IV SOLN
INTRAVENOUS | Status: AC
  Filled 2024-02-20: qty 1

## 2024-02-20 MED ORDER — DIPHENHYDRAMINE HCL 50 MG/ML IJ SOLN
50.0000 mg | Freq: Once | INTRAMUSCULAR | Status: AC
  Administered 2024-02-20: 50 mg via INTRAVENOUS
  Filled 2024-02-20: qty 1

## 2024-02-20 MED ORDER — INSULIN GLARGINE 100 UNIT/ML ~~LOC~~ SOLN
7.0000 [IU] | Freq: Every day | SUBCUTANEOUS | Status: DC
  Administered 2024-02-20: 7 [IU] via SUBCUTANEOUS
  Filled 2024-02-20: qty 0.07

## 2024-02-20 SURGICAL SUPPLY — 25 items
BALLOON LUTONIX DCB 4X40X130 (BALLOONS) IMPLANT
BALLOON LUTONIX DCB 5X60X130 (BALLOONS) IMPLANT
BALLOON LUTONIX DCB 6X80X130 (BALLOONS) IMPLANT
CATH ANGIO 5F PIGTAIL 65CM (CATHETERS) IMPLANT
CATH BEACON 5 .035 65 KMP TIP (CATHETERS) IMPLANT
CATH BEACON 5 .035 65 RIM TIP (CATHETERS) IMPLANT
CATH BEACON TIP VERT 5FR 125 (CATHETERS) IMPLANT
CATH CXI SUPP ANG 4FR 135 (CATHETERS) IMPLANT
CATH SEEKER .035X135CM (CATHETERS) IMPLANT
COVER PROBE ULTRASOUND 5X96 (MISCELLANEOUS) IMPLANT
DEVICE PRESTO INFLATION (MISCELLANEOUS) IMPLANT
DEVICE STARCLOSE SE CLOSURE (Vascular Products) IMPLANT
DEVICE TORQUE (MISCELLANEOUS) IMPLANT
GLIDEWIRE ADV .035X260CM (WIRE) IMPLANT
GLIDEWIRE ANGLED SS 035X260CM (WIRE) IMPLANT
GOWN STRL REUS W/ TWL LRG LVL3 (GOWN DISPOSABLE) ×1 IMPLANT
NDL ENTRY 21GA 7CM ECHOTIP (NEEDLE) IMPLANT
NEEDLE ENTRY 21GA 7CM ECHOTIP (NEEDLE) ×1 IMPLANT
PACK ANGIOGRAPHY (CUSTOM PROCEDURE TRAY) ×1 IMPLANT
SET INTRO CAPELLA COAXIAL (SET/KITS/TRAYS/PACK) IMPLANT
SHEATH ANL2 6FRX45 HC (SHEATH) IMPLANT
SHEATH BRITE TIP 5FRX11 (SHEATH) IMPLANT
SYR MEDRAD MARK 7 150ML (SYRINGE) IMPLANT
TUBING CONTRAST HIGH PRESS 72 (TUBING) IMPLANT
WIRE J 3MM .035X145CM (WIRE) IMPLANT

## 2024-02-20 NOTE — OR Nursing (Signed)
 Fsbs 319 at 2017 Then repeated at [id number].

## 2024-02-20 NOTE — ED Notes (Signed)
 This RN found pt sitting on the edge of the bed. Pt states to this RN it just feels better on my leg when it is daggling. This RN placed recliner in patients room and assisted pt to recliner. Pt very appreciative of being in recliner. Pt tolerated well. Dr. Neomi made aware that pt is in recliner and reports that she is okay with this.

## 2024-02-20 NOTE — Op Note (Signed)
 [facility]  Percutaneous Study/Intervention Procedural Note   Date of Surgery: 02/20/2024  Surgeon:  Cordella JUDITHANN Shawl, MD.  Pre-operative Diagnosis: Atherosclerotic occlusive disease bilateral lower extremities with rest pain of the left lower extremity associated with nonhealing wound  Post-operative diagnosis:  Same  Procedure(s) Performed:             1.  Introduction catheter into left lower extremity 3rd order catheter placement              2.    Contrast injection left lower extremity for distal runoff             3.  Percutaneous transluminal angioplasty left profunda femoris artery.             4.  Star close closure right common femoral arteriotomy  Anesthesia: Conscious sedation was administered under my direct supervision by the interventional radiology RN. IV Versed  plus fentanyl  were utilized. Continuous ECG, pulse oximetry and blood pressure was monitored throughout the entire procedure.  Conscious sedation was for a total of 65 minutes and 20 seconds.  Sheath: 6 [place] Ansell right common femoral retrograde  Contrast: 85 cc  Fluoroscopy Time: 19.2 minutes  Indications:  Raymond Lamb presents with worsening ischemic symptoms of his right lower extremity.  He has also been struggling with a nonhealing fracture.  The patient has known atherosclerotic occlusive disease and has undergone numerous angiograms and surgeries. Noninvasive studies as well as physical examination demonstrate significant atherosclerotic changes. This places the patient at high risk for limb loss. Angiography with the hope for intervention for limb salvage is recommended.  The risks and benefits are reviewed all questions answered patient agrees to proceed.  Procedure:  Raymond Lamb is a 38 y.o. y.o. male who was identified and appropriate procedural time out was performed.  The patient was then placed supine on the table and prepped and draped in the usual sterile  fashion.    Ultrasound was placed in the sterile sleeve and the right groin was evaluated the right common femoral artery was echolucent and pulsatile indicating patency.  Image was recorded for the permanent record and under real-time visualization a microneedle was inserted into the common femoral artery microwire followed by a micro-sheath.  A J-wire was then advanced through the micro-sheath and a  5 [place] sheath was then inserted over a J-wire. J-wire was then advanced and a 5 French pigtail catheter was positioned at the level of T12. AP projection of the aorta was then obtained. Pigtail catheter was repositioned to above the bifurcation and a RAO view of the pelvis was obtained.  Subsequently a pigtail catheter with the stiff angle Glidewire was used to cross the aortic bifurcation the catheter wire were advanced down into the left distal external iliac artery. Oblique view of the femoral bifurcation was then obtained and subsequently the wire was reintroduced and the pigtail catheter   4000 units of heparin  was then given and allowed to circulate and a 6 [place] Ansell sheath was advanced up and over the bifurcation and positioned in the femoral artery  KMP  catheter and stiff angle Glidewire were then negotiated down into the distal vein bypass.  I negotiated into the occluded bypass representing third order catheter placement subsequently I was able to get a CXI catheter into the posterior tibial artery. Distal runoff of the posterior tibial artery was then performed.  It was clear from this imaging that the posterior tibial artery is diffusely diseased  quite small less than 1 mm and abruptly occludes at the level of the medial malleolus.  There is no visible reconstitution of the medial or lateral plantar arteries.  At this point I did not see that attempting to perform thrombolysis would sustain the bypass given the poor outflow.  The wire and catheter were then pulled back the sheath was  repositioned and I then negotiated a Kumpe catheter and stiff angled Glidewire into the distal profunda femoris across 2 subtotal occlusions.  The advantage wire was then reintroduced and a 4 mm x 40 mm Lutonix drug-eluting balloon was used to angioplasty the distal portion of the profunda femoris specifically the more distal subtotal occlusion.  Follow-up imaging demonstrated marked improvement but there was still 50% residual stenosis and a 5 mm x 80 mm Lutonix drug-eluting balloon was advanced across this lesion extending more proximally and inflated to 10 atm for 1 minute.  Follow-up imaging demonstrated less than 20% residual stenosis distally and I elected to use a 6 mm x 80 mm Lutonix drug-eluting balloon positioned more proximally specifically addressing the more proximal subtotal occlusion as well as the moderate to severe common femoral artery disease. Inflations were to 8 to 10 atm atmospheres for 1 minute. Follow-up imaging demonstrated patency with less than 10% residual stenosis.  There is a marked improvement in the filling of the profunda femoris and numerous additional collaterals are now showing up.  After review of these images the sheath is pulled into the right external iliac oblique of the common femoral is obtained and a Star close device deployed. There no immediate complications.   Findings:  The abdominal aorta is opacified with a bolus injection contrast. Renal arteries are poorly visualized consistent with his end-stage renal disease. The aorta itself has diffuse disease but no hemodynamically significant lesions. The common and external iliac arteries are widely patent bilaterally.  The left common femoral is diseased with approximately 65% stenosis in its distal half there are 2 subtotal occlusions located in the profunda femoris.  There is no stump representing the SFA.  There is nonvisualization of the SFA throughout its entirety as well as the above-knee popliteal.  There is a  short segment of at knee popliteal that reconstitutes but then it occludes.  From injections at the level of the common femoral there is nonvisualization of the tibial arteries.  There is the stump of the vein bypass noted.  I was able to select the vein bypass and advance first a Kumpe catheter down to the anastomosis and ultimately cross the anastomosis into the posterior tibial artery with a stiff angled Glidewire and a CXI catheter.  Hand-injection of contrast in the posterior tibial artery demonstrates severe disease at the level of the anastomosis.  More importantly, the posterior tibial artery from the anastomosis distally is diffusely diseased approximately 1 mm in diameter and demonstrates an abrupt occlusion at the level of the medial malleolus.  There is no reconstitution of the plantar arteries and no filling of the pedal arch.  This artery would not sustain patency of a bypass and therefore I did not feel that thrombolysis was indicated.  I then turned my attention back to the profunda femoris.  Following angioplasty of the profunda femoris, it is now patent with marked improvement in the number of collaterals.     Summary: Successful recanalization of the profunda femoris of the left lower extremity.  My hope is that this improved flow will alleviate his rest pain.  I am  uncertain as to whether this would be enough to allow for healing of his fracture.  I do not see a feasible way to reconstruct him distally.  His posterior tibial will not support patency of a bypass.                       Disposition: Patient was taken to the recovery room in stable condition having tolerated the procedure well.  Raymond Lamb, Cordella MATSU 02/20/2024,9:10 PM

## 2024-02-20 NOTE — Consult Note (Signed)
 PHARMACY - ANTICOAGULATION CONSULT NOTE  Pharmacy Consult for Heparin  Infusion Indication: limb ischemia  Allergies  Allergen Reactions   Baclofen Itching and Other (See Comments)    Became disoriented, emesis   Iodinated Contrast Media Hives   Lisinopril Swelling   Other Swelling   Oxybenzone     Other reaction(s): Unknown    Patient Measurements: Height: 5' 9 (175.3 cm) Weight: 66.2 kg (146 lb) IBW/kg (Calculated) : 70.7 HEPARIN  DW (KG): 66.2  Vital Signs: Temp: 98.2 F (36.8 C) (08/27 0419) Temp Source: Oral (08/27 0419) BP: 178/101 (08/27 0419) Pulse Rate: 95 (08/27 0419)  Labs: Recent Labs    02/20/24 0429  HGB 9.5*  HCT 30.6*  PLT 273  LABPROT 14.2  INR 1.0  CREATININE 8.90*    Estimated Creatinine Clearance: 10.3 mL/min (A) (by C-G formula based on SCr of 8.9 mg/dL (H)).   Medical History: Past Medical History:  Diagnosis Date   Anemia of chronic disease    CAD (coronary artery disease)    a. 11/2015 Cath: LM nl, LAD 30p, LCX nl, OM2 70-80, RCA 50-67m; b. 08/2020 MV: EF 56%. No isch/infarct; c. 10/2020 NSTEMI in setting of DKA-->Cath: LM nl, LAD nl, D2 70, LCX nl, OM2 70, OM3 70 inf branch, RCA 80p/m-->Med rx.   ESRD (end stage renal disease) (HCC)    Essential hypertension    History of echocardiogram    a. 10/2020 Echo: EF >55%, nl RV fxn.   Mixed hyperlipidemia    PVD (peripheral vascular disease) (HCC)    a. 07/2015 PTA/Stenting RSFA; b. 12/2015 LLE Fem->PTA bypass; c. 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 04/2020 Duplex: patent LLE bypass. Mild prox RSFA stenosis. Stable ABIs.   Stroke [facility])    Tobacco abuse    Type 1 diabetes (HCC)     Medications:  No history of chronic anticoagulation PTA  Assessment: 38yo male with history of coronary artery disease, hypertension, hyperlipidemia, type 1 diabetes, end-stage renal disease on hemodialysis Monday/Wednesday/Friday, peripheral vascular disease status post left lower extremity bypass  presents to ED with complaints of hyperglycemia and leg spasms that began with the last few days. Patient also reports he broke his leg 2 months ago. Patient follows with [facility] vascular. Due to concern for possible ischemic limb, pharmacy has been consulted to initiate and dose continuous heparin  infusion.  Baseline labs: aPTT 51 sec, INR 1.0, Plts 273, Hgb 9.5  Goal of Therapy:  Heparin  level 0.3-0.7 units/ml Monitor platelets by anticoagulation protocol: Yes   Plan:  Give 5000 units bolus x 1 Start heparin  infusion at 1150 units/hr Check anti-Xa level in 8 hours and daily while on heparin  Continue to monitor H&H and platelets  Raylei Losurdo A [employer] P[employer] 02/20/2024,5:41 AM

## 2024-02-20 NOTE — Consult Note (Signed)
 PHARMACY - ANTICOAGULATION CONSULT NOTE  Pharmacy Consult for Heparin  Infusion Indication: limb ischemia  Allergies  Allergen Reactions   Baclofen Itching and Other (See Comments)    Became disoriented, emesis   Camellia Swelling   Iodinated Contrast Media Hives   Lisinopril Swelling   Other Swelling   Oxybenzone     Other reaction(s): Unknown    Patient Measurements: Height: 5' 9 (175.3 cm) Weight: 66.2 kg (145 lb 15.1 oz) IBW/kg (Calculated) : 70.7 HEPARIN  DW (KG): 66.2  Vital Signs: Temp: 97.5 F (36.4 C) (08/27 2118) Temp Source: Temporal (08/27 2101) BP: 160/92 (08/27 2118) Pulse Rate: 85 (08/27 2118)  Labs: Recent Labs    02/20/24 0429 02/20/24 1426  HGB 9.5*  --   HCT 30.6*  --   PLT 273  --   APTT 51*  --   LABPROT 14.2  --   INR 1.0  --   HEPARINUNFRC  --  0.42  CREATININE 8.90*  --     Estimated Creatinine Clearance: 10.3 mL/min (A) (by C-G formula based on SCr of 8.9 mg/dL (H)).   Medical History: Past Medical History:  Diagnosis Date   Anemia of chronic disease    CAD (coronary artery disease)    a. 11/2015 Cath: LM nl, LAD 30p, LCX nl, OM2 70-80, RCA 50-51m; b. 08/2020 MV: EF 56%. No isch/infarct; c. 10/2020 NSTEMI in setting of DKA-->Cath: LM nl, LAD nl, D2 70, LCX nl, OM2 70, OM3 70 inf branch, RCA 80p/m-->Med rx.   ESRD (end stage renal disease) (HCC)    Essential hypertension    History of echocardiogram    a. 10/2020 Echo: EF >55%, nl RV fxn.   Mixed hyperlipidemia    PVD (peripheral vascular disease) (HCC)    a. 07/2015 PTA/Stenting RSFA; b. 12/2015 LLE Fem->PTA bypass; c. 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 04/2020 Duplex: patent LLE bypass. Mild prox RSFA stenosis. Stable ABIs.   Stroke [facility])    Tobacco abuse    Type 1 diabetes (HCC)     Medications:  No history of chronic anticoagulation PTA  Assessment: 38yo male with history of coronary artery disease, hypertension, hyperlipidemia, type 1 diabetes, end-stage renal  disease on hemodialysis Monday/Wednesday/Friday, peripheral vascular disease status post left lower extremity bypass presents to ED with complaints of hyperglycemia and leg spasms that began with the last few days. Patient also reports he broke his leg 2 months ago. Patient follows with [facility] vascular. Due to concern for possible ischemic limb, pharmacy has been consulted to initiate and dose continuous heparin  infusion.  Baseline labs: aPTT 51 sec, INR 1.0, Plts 273, Hgb 9.5  8/27 1426 HL 0.42,  therapeutic x 1 8/27 1907 heparin  drip stopped for procedure, to be resumed at 2130 per vasc consult order  Goal of Therapy:  Heparin  level 0.3-0.7 units/ml Monitor platelets by anticoagulation protocol: Yes   Plan:  Resuming heparin  drip after procedure, no initial bolus, resume at previous rate resume heparin  infusion at 1150 units/hr Check  anti-Xa level in 8 hours and daily while on heparin  Continue to monitor H&H and platelets  Allean Haas PharmD Clinical Pharmacist 02/20/2024

## 2024-02-20 NOTE — Interval H&P Note (Signed)
 History and Physical Interval Note:  02/20/2024 7:14 PM  Raymond Lamb  has presented today for surgery, with the diagnosis of atherosclerosis with rest pain and nonhealing wound.  The various methods of treatment have been discussed with the patient and family. After consideration of risks, benefits and other options for treatment, the patient has consented to  Procedure(s): Lower Extremity Angiography (Left) as a surgical intervention.  The patient's history has been reviewed, patient examined, no change in status, stable for surgery.  I have reviewed the patient's chart and labs.  Questions were answered to the patient's satisfaction.     Cordella Shawl

## 2024-02-20 NOTE — Progress Notes (Signed)
 Dr. Jama in at bedside, speaking with pt. And his mother Tori re: preop vascular case. Both verbalized understanding of conversation with MD.

## 2024-02-20 NOTE — OR Nursing (Signed)
Fsbs 368

## 2024-02-20 NOTE — ED Notes (Signed)
 Lab contacted to add on APTT to existing blue top sending down to lab prior.

## 2024-02-20 NOTE — ED Notes (Signed)
 Ancef  sent up with pt.

## 2024-02-20 NOTE — Inpatient Diabetes Management (Addendum)
 Inpatient Diabetes Program Recommendations  AACE/ADA: New Consensus Statement on Inpatient Glycemic Control   Target Ranges:  Prepandial:   less than 140 mg/dL      Peak postprandial:   less than 180 mg/dL (1-2 hours)      Critically ill patients:  140 - 180 mg/dL    Latest Reference Range & Units 02/20/24 10:46  Glucose-Capillary 70 - 99 mg/dL 785 (H)    Latest Reference Range & Units 02/20/24 04:16 02/20/24 04:59 02/20/24 06:04  Glucose-Capillary 70 - 99 mg/dL 598 (H)   Novolog  5 units  Solumedrol 40 mg 276 (H)    Review of Glycemic Control  Diabetes history: DM1 Outpatient Diabetes medications: OmniPod 5 (basal 0.35 units/hr (8.4 units per 24 hours); I:CR 1:1, I:SF 1:90 mg/dl); off pump plan Lantus  8 units at bedtime, insulin  lispro 3 units TID with meals plus 1:50 mg/dl >859 mg/dl for correction  Current orders for Inpatient glycemic control: None  Inpatient Diabetes Program Recommendations:    Insulin : Please consider ordering insulin  glargine 7 units Q24H (based on 66.2 kg x 0.1 units), CBGs AC&HS, Novolog  0-6 units AC&HS, and Novolog  2 units TID with meals for meal coverage if patient eats at least 50% of meals.  NOTE: Patient in ED for leg spasms and hyperglycemia. Patient has hx of Type 1 DM, uses an insulin  pump outpatient, and has ESRD w/HD.  In reviewing chart noted patient was inpatient at [facility] 01/29/24-01/30/24 and per Endocrinology note by B. Alona, PA patient uses OmniPod 5 insulin  pump with insulin  lispro and Dexcom G7 CGM; pump settings noted to be:  Basal 0.35 units/hr (total basal of 8.4 units per 24 hours) ICR 1 (does not carb count. Estimates units needed per meal)  ISF 90 Target 140 Correct above 140 mg/dl  Per note today by K. Macklin patient removed his insulin  pump around 6:02 am. Tried to call patient on his cell phone but no answer. Sent chat message to Leeroy, RN to make sure patient does not have his insulin  pump on currently. RN reports that patient does  not have OmniPod on but does still have Dexcom G7 CGM sensor on.  Asked RN to ask attending provider to review recommendations so SQ insulin  could be ordered for patient.  Addendum 02/20/24-Spoke with patient at bedside regarding DM control. Patient reports that he uses OmniPod 5 pump with Novolog  insulin  and Dexcom G7 CGM for DM control. Patient confirms that he removed his insulin  pump this morning around 6am. Patient uses OmniPod app on his cell phone as his controller for his insulin  pump. Patient's cell phone battery is dead currently. Assisted patient to plug in charger so his phone can be charged. Discussed insulin  pump settings noted from [facility] Endocrinology note on 01/30/24 and patient states that he believes these settings are correct. Patient states he does not count carbs but puts in 1 or 2 for carbs with meals (pump set for 1 unit per 1 gram entered).  Patient confirms that he still has on his Dexcom G7 CGM sensor. Discussed that SQ insulin  would be used while inpatient. Patient reports he does not think he needs any prescriptions for DM medications or supplies at discharge.  Patient appreciative of visit and has no questions at this time.  Thanks, Earnie Gainer, RN, MSN, CDCES Diabetes Coordinator Inpatient Diabetes Program [phone] (Team Pager from 8am to 5pm)

## 2024-02-20 NOTE — OR Nursing (Signed)
 Glucometer info not flowing over to chart. Rechecked fsbs with different glucometer, 341.

## 2024-02-20 NOTE — ED Notes (Signed)
 Pt removed his home insulin  pump at this time per verbal order from Dr. Neomi.

## 2024-02-20 NOTE — ED Provider Notes (Signed)
 [facility] Provider Note    None    (approximate)   History   Hyperglycemia and Spasms   HPI  Raymond Lamb is a 38 y.o. male with history of coronary artery disease, hypertension, hyperlipidemia, type 1 diabetes, end-stage renal disease on hemodialysis Monday/Wednesday/Friday, peripheral vascular disease status post left lower extremity bypass followed by Dr. Ave at [facility] (last seen in office 01/31/24) who presents to the emergency department with complaints of left leg pain.  States he has had left leg pain for the past 2 months since he had a fall in the bathroom and has tibia and fibula fracture.  He is in a boot.  He does not ambulate at baseline and uses a wheelchair.  He denies any new injury but states tonight about 2 to 3 hours prior to arrival he started having increased pain in his leg that he describes as a cramping.  He does have calf tenderness.  He has had previous left upper extremity DVT but no lower extremity DVT.  He states that he gets short of breath when the pain becomes severe but no chest pain.  No fever.  Does have numbness in the foot but states some of this is chronic.  He has not missed any dialysis.  He is due to dialysis today.    Patient's blood sugars were also elevated and reading high.  He has a Dexcom and insulin  pump.  Blood sugar in the 400s with EMS.  He denies any abdominal pain, vomiting.  History provided by patient, EMS.    Past Medical History:  Diagnosis Date   Anemia of chronic disease    CAD (coronary artery disease)    a. 11/2015 Cath: LM nl, LAD 30p, LCX nl, OM2 70-80, RCA 50-15m; b. 08/2020 MV: EF 56%. No isch/infarct; c. 10/2020 NSTEMI in setting of DKA-->Cath: LM nl, LAD nl, D2 70, LCX nl, OM2 70, OM3 70 inf branch, RCA 80p/m-->Med rx.   ESRD (end stage renal disease) (HCC)    Essential hypertension    History of echocardiogram    a. 10/2020 Echo: EF >55%, nl RV fxn.   Mixed hyperlipidemia    PVD  (peripheral vascular disease) (HCC)    a. 07/2015 PTA/Stenting RSFA; b. 12/2015 LLE Fem->PTA bypass; c. 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 04/2020 Duplex: patent LLE bypass. Mild prox RSFA stenosis. Stable ABIs.   Stroke [facility])    Tobacco abuse    Type 1 diabetes (HCC)     Past Surgical History:  Procedure Laterality Date   AVF Left     MEDICATIONS:  Prior to Admission medications   Medication Sig Start Date End Date Taking? Authorizing Provider  amLODipine  (NORVASC ) 10 MG tablet Take 10 mg by mouth daily. 10/14/21   [provider]  aspirin  81 MG chewable tablet Chew 81 mg by mouth daily. 11/25/20   [provider]  atorvastatin  (LIPITOR ) 80 MG tablet Take 80 mg by mouth every evening. 09/29/20   [provider]  cinacalcet  (SENSIPAR ) 60 MG tablet Take 120 mg by mouth daily. 11/08/20   [provider]  clopidogrel  (PLAVIX ) 75 MG tablet Take 75 mg by mouth daily. 08/30/20   [provider]  ezetimibe  (ZETIA ) 10 MG tablet Take 10 mg by mouth daily. 02/10/21 02/10/22  [provider]  gabapentin  (NEURONTIN ) 300 MG capsule Take 600 mg by mouth at bedtime. 09/28/20   [provider]  hydrALAZINE  (APRESOLINE ) 50 MG tablet Take 50  mg by mouth 3 (three) times daily. 10/14/21   [provider]  isosorbide  mononitrate (IMDUR ) 30 MG 24 hr tablet Take 30 mg by mouth daily. 10/14/21   [provider]  LANTUS  SOLOSTAR 100 UNIT/ML Solostar Pen Inject 13 Units into the skin at bedtime. 12/17/20   Jens Durand, MD  metoprolol  succinate (TOPROL -XL) 100 MG 24 hr tablet Take 100 mg by mouth daily. 10/14/21   [provider]  nitroGLYCERIN  (NITROSTAT ) 0.4 MG SL tablet Place 1 tablet under the tongue daily as needed. 11/20/20   [provider]  NOVOLOG  FLEXPEN 100 UNIT/ML FlexPen Inject 5 Units into the skin 3 (three) times daily. Use per sliding scale no more then 50 units a day Patient taking differently: Inject 2-6  Units into the skin 3 (three) times daily. - Take Novolog  2 units with meals. This is ONLY if you eat. In addition to this, you should add: - 1 unit if your pre-meal blood sugar is 150-250 - 2 units if your pre-meal blood sugar is 251-350 - 3 units if your pre-meal blood sugar is 351-450 - 4 units if your pre-meal blood sugar is 451+ 12/02/20   Dickie Begun, MD  omeprazole (PRILOSEC) 40 MG capsule Take 40 mg by mouth daily with breakfast. 08/05/21   [provider]  sertraline  (ZOLOFT ) 100 MG tablet Take 150 mg by mouth in the morning. 11/25/20   [provider]    Physical Exam   Triage Vital Signs: ED Triage Vitals  Encounter Vitals Group     BP 02/20/24 0419 (!) 178/101     Girls Systolic BP Percentile --      Girls Diastolic BP Percentile --      Boys Systolic BP Percentile --      Boys Diastolic BP Percentile --      Pulse Rate 02/20/24 0417 95     Resp 02/20/24 0419 20     Temp 02/20/24 0419 98.2 F (36.8 C)     Temp Source 02/20/24 0419 Oral     SpO2 02/20/24 0417 98 %     Weight --      Height --      Head Circumference --      Peak Flow --      Pain Score --      Pain Loc --      Pain Education --      Exclude from Growth Chart --     Most recent vital signs: Vitals:   02/20/24 0615 02/20/24 0630  BP:  (!) 160/99  Pulse: 83 80  Resp: 13 14  Temp:    SpO2: 99% 91%    CONSTITUTIONAL: Alert, responds appropriately to questions.  Appears uncomfortable, chronically ill-appearing HEAD: Normocephalic, atraumatic EYES: Conjunctivae clear, pupils appear equal, sclera nonicteric ENT: normal nose; moist mucous membranes NECK: Supple, normal ROM CARD: RRR; S1 and S2 appreciated RESP: Normal chest excursion without splinting or tachypnea; breath sounds clear and equal bilaterally; no wheezes, no rhonchi, no rales, no hypoxia or respiratory distress, speaking full sentences ABD/GI: Non-distended; soft, non-tender, no rebound, no guarding, no peritoneal  signs BACK: The back appears normal EXT: AV fistula in the left upper extremity with good thrill and bruit.  Patient has tenderness throughout the left leg from knee to foot.  Decree sensation in the left foot which is somewhat chronic.  He has a monophasic dopplerable pulse in the left popliteal area but I am not able to palpate or Doppler a  DP or PT pulse on the left but can Doppler monophasic DP and PT pulses on the right.  He has multiple chronic appearing superficial wounds to the distal left lower extremity without surrounding redness, increased warmth or drainage.  He is tender in his left calf without significant swelling.  He keeps his left hip and knee flexed and cannot straighten it out due to pain.  Diminished capillary refill noted to both distal lower extremities. SKIN: Normal color for age and race; warm; no rash on exposed skin NEURO: Moves all extremities equally, normal speech PSYCH: The patient's mood and manner are appropriate.   ED Results / Procedures / Treatments   LABS: (all labs ordered are listed, but only abnormal results are displayed) Labs Reviewed  CBC WITH DIFFERENTIAL/PLATELET - Abnormal; Notable for the following components:      Result Value   WBC 11.7 (*)    RBC 3.21 (*)    Hemoglobin 9.5 (*)    HCT 30.6 (*)    RDW 15.7 (*)    Eosinophils Absolute 0.7 (*)    All other components within normal limits  COMPREHENSIVE METABOLIC PANEL WITH GFR - Abnormal; Notable for the following components:   Chloride 93 (*)    Glucose, Bld 437 (*)    BUN 49 (*)    Creatinine, Ser 8.90 (*)    Albumin 3.4 (*)    GFR, Estimated 7 (*)    Anion gap 17 (*)    All other components within normal limits  APTT - Abnormal; Notable for the following components:   aPTT 51 (*)    All other components within normal limits  CBG MONITORING, ED - Abnormal; Notable for the following components:   Glucose-Capillary 401 (*)    All other components within normal limits  CBG MONITORING,  ED - Abnormal; Notable for the following components:   Glucose-Capillary 276 (*)    All other components within normal limits  MAGNESIUM  PROTIME-INR  HEPARIN  LEVEL (UNFRACTIONATED)  TYPE AND SCREEN     EKG:  EKG Interpretation Date/Time:  Wednesday February 20 2024 04:28:44 EDT Ventricular Rate:  94 PR Interval:  142 QRS Duration:  84 QT Interval:  355 QTC Calculation: 444 R Axis:   47  Text Interpretation: Sinus rhythm Baseline wander in lead(s) V1 V2 Confirmed by Neomi Neptune [id number]) on 02/20/2024 5:05:42 AM         RADIOLOGY: My personal review and interpretation of imaging: No DVT.  Patient has nonhealing fractures of the distal tibia, proximal fibula.  I have personally reviewed all radiology reports.   US  Venous Img Lower Unilateral Left Result Date: 02/20/2024 EXAM: ULTRASOUND DUPLEX OF THE LEFT LOWER EXTREMITY VEINS TECHNIQUE: Duplex ultrasound using B-mode/gray scaled imaging and Doppler spectral analysis and color flow was obtained of the deep venous structures of the left lower extremity. COMPARISON: None. CLINICAL HISTORY: Calf pain. FINDINGS: The visualized veins of the lower extremity are patent and free of echogenic thrombus. The veins demonstrate good compressibility with normal color flow study and spectral analysis. IMPRESSION: 1. No evidence of DVT. Electronically signed by: Waddell Calk MD 02/20/2024 06:57 AM EDT RP Workstation: [id number]   DG Tibia/Fibula Left Result Date: 02/20/2024 EXAM: 3 VIEW(S) XRAY OF THE LEFT TIBIA AND FIBULA 02/20/2024 05:20:00 AM COMPARISON: None available. CLINICAL HISTORY: Pain, h/o fx. The patient presents with spasms in left tib fib, h/o fracture x 2 months ago. Patient unable to straighten leg. Difficulty finding pulse in leg per RN. FINDINGS: BONES AND  JOINTS: Minimally displaced spiral fracture of distal tibial diaphysis and minimally displaced oblique fracture of proximal fibular diaphysis. SOFT TISSUES: Diffuse subcutaneous  edema. Vascular calcification and surgical clips noted. IMPRESSION: 1. Minimally displaced spiral fracture of distal tibial diaphysis and minimally displaced oblique fracture of proximal fibular diaphysis. 2. Diffuse subcutaneous edema. Electronically signed by: Waddell Calk MD 02/20/2024 05:24 AM EDT RP Workstation: [id number]     PROCEDURES:  Critical Care performed: Yes, see critical care procedure note(s)   CRITICAL CARE Performed by: Josette Sink   Total critical care time: 40 minutes  Critical care time was exclusive of separately billable procedures and treating other patients.  Critical care was necessary to treat or prevent imminent or life-threatening deterioration.  Critical care was time spent personally by me on the following activities: development of treatment plan with patient and/or surrogate as well as nursing, discussions with consultants, evaluation of patient's response to treatment, examination of patient, obtaining history from patient or surrogate, ordering and performing treatments and interventions, ordering and review of laboratory studies, ordering and review of radiographic studies, pulse oximetry and re-evaluation of patient's condition.   SABRA1-3 Lead EKG Interpretation  Performed by: Joselle Deeds, Josette SAILOR, DO Authorized by: Glorie Dowlen, Josette SAILOR, DO     Interpretation: normal     ECG rate:  80   ECG rate assessment: normal     Rhythm: sinus rhythm     Ectopy: none     Conduction: normal       IMPRESSION / MDM / ASSESSMENT AND PLAN / ED COURSE  I reviewed the triage vital signs and the nursing notes.    Patient here with increased left lower extremity pain over the past 2 to 3 hours prior to arrival.  History of peripheral arterial disease, prior upper extremity DVT, recent fall and fractures to this left leg.  The patient is on the cardiac monitor to evaluate for evidence of arrhythmia and/or significant heart rate changes.   DIFFERENTIAL DIAGNOSIS  (includes but not limited to):   Ischemic limb, claudication, DVT, pain from nonhealing fractures, electrolyte derangement, no sign of compartment syndrome or cellulitis, no sign of septic arthritis or gout   Patient's presentation is most consistent with acute presentation with potential threat to life or bodily function.   PLAN: I am concerned for acute ischemia given I am not able to Doppler a DP or PT pulse on the left and he is having increased pain from baseline.  He states that it is always difficult to find a pulse in this lower extremity and he has multiple notes from [facility] vascular surgery.  It is unclear what his exam is like chronically.  Discussed with patient that acute ischemia could be causing his symptoms but also he is at risk for DVT given his prior history and that he is immobilized in a boot.  Will obtain CT runoff study, venous Doppler.  Will also obtain x-rays of the left leg given he has fractures that are known and nonhealing.  Will keep patient n.p.o., give pain medication, start heparin .  Will reach out to vascular surgery at [facility] as he would like to transfer they are given he is well-known to their service and was last seen in the beginning of August by Dr. Ave.   MEDICATIONS GIVEN IN ED: Medications  heparin  ADULT infusion 100 units/mL (25000 units/250mL) (1,150 Units/hr Intravenous New Bag/Given 02/20/24 0556)  methylPREDNISolone  sodium succinate  (SOLU-MEDROL ) 40 mg/mL injection 40 mg (40 mg Intravenous Given 02/20/24 0456)  diphenhydrAMINE  (  BENADRYL ) capsule 50 mg ( Oral See Alternative 02/20/24 0723)    Or  diphenhydrAMINE  (BENADRYL ) injection 50 mg (50 mg Intravenous Given 02/20/24 0723)  morphine  (PF) 4 MG/ML injection 4 mg (4 mg Intravenous Given 02/20/24 0457)  ondansetron  (ZOFRAN ) injection 4 mg (4 mg Intravenous Given 02/20/24 0455)  insulin  aspart (novoLOG ) injection 5 Units (5 Units Subcutaneous Given 02/20/24 0459)  methocarbamol  (ROBAXIN ) injection 500 mg  (500 mg Intravenous Given 02/20/24 0543)  heparin  bolus via infusion 5,000 Units (5,000 Units Intravenous Bolus from Bag 02/20/24 0556)  morphine  (PF) 4 MG/ML injection 4 mg (4 mg Intravenous Given 02/20/24 0722)     ED COURSE: Blood sugar here 401.  Improved to 276 after subcutaneous insulin .  We have removed his insulin  pump.  He is NPO.  Leukocytosis of 11.7 which may be reactive.  Chronic anemia with hemoglobin of 9.5.  Normal potassium, magnesium.  Normal bicarb.  Elevated anion gap which may be secondary to uremia, less likely DKA.  X-ray of the left lower extremity, venous Doppler reviewed and interpreted by myself and the radiologist.  He has nonhealing distal tibia and proximal fibular fractures.  No DVT appreciated.   CONSULTS:  6:20 AM  Spoke with Dr. Leanor with vascular surgery at [facility]  He will review patient's records.  Recommends keeping n.p.o., continue the heparin  and pain medication as needed.  He also asked that we contact our vascular surgeon here to see if this is something they would manage here.  Patient is requesting transfer to [facility] however where he is well-known to Dr. Ave.  6:30 AM  Spoke with Dr. Jama with vascular surgery here who is happy to see the patient in consult for surgical intervention today.  Will continue to keep patient NPO and continue IV heparin .  7:20 AM  Awaiting call back from [facility].  Signed out to oncoming ED physician.  Patient is aware of plan and he is also aware that he is high risk for AKA.  He states he has been told this previously.  Will continue to keep him n.p.o., continue IV heparin .  States pain is increasing again.  Will give a second dose of morphine .   OUTSIDE RECORDS REVIEWED: Reviewed multiple recent [facility] notes.       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   Jhalil Silvera, Josette SAILOR, DO 02/20/24 [phone]

## 2024-02-20 NOTE — ED Notes (Signed)
 This RN walked into pts room and noted that pt was itching his left foot. Pt reports that he was trying to scratch off dead skin. Pt noted to have small areas of minimal bleeding. This RN placed nonstick guaze, and wrapped area with roll guaze and secured with paper tape. Pt tolerated well. Pt educated on the importance of not touching or scratching his foot into open sores. Dr. Neomi made aware.

## 2024-02-20 NOTE — ED Notes (Signed)
 Pt returns from CT, they were unable to perform CT as pt was not able to stretch out leg and keep it straight for procedure.  CT tech informed EDP.  Pt helped back in recliner.  Call bell in reach, per pt dexcom his blood sugar is 192 at this time.

## 2024-02-20 NOTE — Consult Note (Signed)
 [facility] Vascular Consult Note  MRN : [id number]  Raymond Lamb is a 38 y.o. (03/25/1986) male who presents with chief complaint of  Chief Complaint  Patient presents with   Hyperglycemia   Spasms  .   Consulting Physician: Lamar Price, MD Reason for consult: Critical limb ischemia History of Present Illness:   Raymond Lamb is a 38 y.o. male with history of coronary artery disease, hypertension, hyperlipidemia, type 1 diabetes, end-stage renal disease on hemodialysis.  He has previously had multiple vascular interventions done by [facility] and he is followed by Dr. Ave.  He was last seen in their office on 01/31/2024 at which point there was discussion about a redo left lower extremity fem distal bypass.  It was also discussed that a redo bypass may not be possible and they may need to consider an above-knee amputation.  His bypass occlusion has been complicated by the fact that he had a fall 2 months ago and had a tibia and fibula fracture.  Since that time he has not been able to heal.  He presents to [facility] after having suddenly worsening pain and discomfort in his left leg that he describes as cramping.  His leg feels better when he is able to dangle it off of the bed.  During his time here he has underwent a left lower extremity venous duplex to rule out DVT.  Current Facility-Administered Medications  Medication Dose Route Frequency Provider Last Rate Last Admin   acetaminophen  (TYLENOL ) tablet 650 mg  650 mg Oral Q6H PRN Paudel, Keshab, MD       Or   acetaminophen  (TYLENOL ) suppository 650 mg  650 mg Rectal Q6H PRN Roann Gouty, MD       ceFAZolin  (ANCEF ) IVPB 2g/100 mL premix  2 g Intravenous On Call to OR Schnier, Cordella MATSU, MD   Held at 02/20/24 1015   heparin  ADULT infusion 100 units/mL (25000 units/250mL)  1,150 Units/hr Intravenous Continuous Nazari, Walid A, RPH 11.5 mL/hr at 02/20/24 0556 1,150 Units/hr at 02/20/24 0556   insulin  aspart  (novoLOG ) injection 0-5 Units  0-5 Units Subcutaneous QHS Paudel, Keshab, MD       insulin  aspart (novoLOG ) injection 0-6 Units  0-6 Units Subcutaneous TID WC Paudel, Keshab, MD   2 Units at 02/20/24 1100   morphine  (PF) 2 MG/ML injection 2 mg  2 mg Intravenous Q2H PRN Roann Gouty, MD       ondansetron  (ZOFRAN ) tablet 4 mg  4 mg Oral Q6H PRN Roann Gouty, MD       Or   ondansetron  (ZOFRAN ) injection 4 mg  4 mg Intravenous Q6H PRN Paudel, Keshab, MD       polyethylene glycol (MIRALAX  / GLYCOLAX ) packet 17 g  17 g Oral Daily PRN Paudel, Keshab, MD        Past Medical History:  Diagnosis Date   Anemia of chronic disease    CAD (coronary artery disease)    a. 11/2015 Cath: LM nl, LAD 30p, LCX nl, OM2 70-80, RCA 50-77m; b. 08/2020 MV: EF 56%. No isch/infarct; c. 10/2020 NSTEMI in setting of DKA-->Cath: LM nl, LAD nl, D2 70, LCX nl, OM2 70, OM3 70 inf branch, RCA 80p/m-->Med rx.   ESRD (end stage renal disease) (HCC)    Essential hypertension    History of echocardiogram    a. 10/2020 Echo: EF >55%, nl RV fxn.   Mixed hyperlipidemia    PVD (peripheral vascular disease) (HCC)    a. 07/2015  PTA/Stenting RSFA; b. 12/2015 LLE Fem->PTA bypass; c. 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 04/2020 Duplex: patent LLE bypass. Mild prox RSFA stenosis. Stable ABIs.   Stroke [facility])    Tobacco abuse    Type 1 diabetes (HCC)     Past Surgical History:  Procedure Laterality Date   AVF Left     Social History Social History   Tobacco Use   Smoking status: Every Day    Current packs/day: 0.30    Average packs/day: 0.3 packs/day for 20.0 years (6.0 ttl pk-yrs)    Types: Cigarettes   Smokeless tobacco: Never   Tobacco comments:    Currently smoking about six cigarettes/day.  Vaping Use   Vaping status: Never Used  Substance Use Topics   Alcohol use: Never   Drug use: Yes    Types: Marijuana    Comment: rarely    Family History Family History  Problem Relation Age of Onset   Diabetes  type I Other    Clotting disorder Neg Hx     Allergies  Allergen Reactions   Baclofen Itching and Other (See Comments)    Became disoriented, emesis   Iodinated Contrast Media Hives   Lisinopril Swelling   Other Swelling   Oxybenzone     Other reaction(s): Unknown     REVIEW OF SYSTEMS (Negative unless checked)  Constitutional: [] Weight loss  [] Fever  [] Chills Cardiac: [] Chest pain   [] Chest pressure   [] Palpitations   [] Shortness of breath when laying flat   [] Shortness of breath at rest   [] Shortness of breath with exertion. Vascular:  [] Pain in legs with walking   [] Pain in legs at rest   [] Pain in legs when laying flat   [] Claudication   [] Pain in feet when walking  [] Pain in feet at rest  [x] Pain in feet when laying flat   [] History of DVT   [] Phlebitis   [] Swelling in legs   [] Varicose veins   [] Non-healing ulcers Pulmonary:   [] Uses home oxygen   [] Productive cough   [] Hemoptysis   [] Wheeze  [] COPD   [] Asthma Neurologic:  [] Dizziness  [] Blackouts   [] Seizures   [] History of stroke   [] History of TIA  [] Aphasia   [] Temporary blindness   [] Dysphagia   [] Weakness or numbness in arms   [] Weakness or numbness in legs Musculoskeletal:  [] Arthritis   [] Joint swelling   [] Joint pain   [] Low back pain Hematologic:  [] Easy bruising  [] Easy bleeding   [] Hypercoagulable state   [] Anemic  [] Hepatitis Gastrointestinal:  [] Blood in stool   [] Vomiting blood  [] Gastroesophageal reflux/heartburn   [] Difficulty swallowing. Genitourinary:  [] Chronic kidney disease   [] Difficult urination  [] Frequent urination  [] Burning with urination   [] Blood in urine Skin:  [] Rashes   [] Ulcers   [x] Wounds Psychological:  [] History of anxiety   []  History of major depression.  Physical Examination  Vitals:   02/20/24 0819 02/20/24 0900 02/20/24 0930 02/20/24 1100  BP:  (!) 147/99 (!) 150/98 (!) 170/85  Pulse:  72 72 82  Resp:  (!) 9 (!) 9 18  Temp: 97.8 F (36.6 C)   98.9 F (37.2 C)  TempSrc: Oral    Oral  SpO2:  95% 96% 100%  Weight:      Height:       Body mass index is 21.56 kg/m. Gen:  WD/WN, NAD Head: [place]/AT, No temporalis wasting. Prominent temp pulse not noted. Ear/Nose/Throat: Hearing grossly intact, nares w/o erythema or drainage, oropharynx w/o Erythema/Exudate  Eyes: Sclera non-icteric, conjunctiva clear Neck: Trachea midline.  No JVD.  Pulmonary:  Good air movement, respirations not labored, equal bilaterally.  Cardiac: RRR, normal S1, S2. Vascular:  Vessel Right Left  PT Non Palpable Non Palpable  DP Non Palpable Non Palpable   Gastrointestinal: soft, non-tender/non-distended. No guarding/reflex.  Musculoskeletal: M/S 5/5 throughout.  Extremities without ischemic changes.  No deformity or atrophy. No edema. Neurologic: Sensation grossly intact in extremities.  Symmetrical.  Speech is fluent. Motor exam as listed above. Psychiatric: Judgment intact, Mood & affect appropriate for pt's clinical situation. Dermatologic: No rashes or ulcers noted.  No cellulitis or open wounds. Lymph : No Cervical, Axillary, or Inguinal lymphadenopathy.    CBC Lab Results  Component Value Date   WBC 11.7 (H) 02/20/2024   HGB 9.5 (L) 02/20/2024   HCT 30.6 (L) 02/20/2024   MCV 95.3 02/20/2024   PLT 273 02/20/2024    BMET    Component Value Date/Time   NA 137 02/20/2024 0429   K 4.2 02/20/2024 0429   CL 93 (L) 02/20/2024 0429   CO2 27 02/20/2024 0429   GLUCOSE 437 (H) 02/20/2024 0429   BUN 49 (H) 02/20/2024 0429   CREATININE 8.90 (H) 02/20/2024 0429   CALCIUM  9.3 02/20/2024 0429   GFRNONAA 7 (L) 02/20/2024 0429   Estimated Creatinine Clearance: 10.3 mL/min (A) (by C-G formula based on SCr of 8.9 mg/dL (H)).  COAG Lab Results  Component Value Date   INR 1.0 02/20/2024   INR 1.1 10/08/2021    Radiology US  Venous Img Lower Unilateral Left Result Date: 02/20/2024 EXAM: ULTRASOUND DUPLEX OF THE LEFT LOWER EXTREMITY VEINS TECHNIQUE: Duplex ultrasound using B-mode/gray  scaled imaging and Doppler spectral analysis and color flow was obtained of the deep venous structures of the left lower extremity. COMPARISON: None. CLINICAL HISTORY: Calf pain. FINDINGS: The visualized veins of the lower extremity are patent and free of echogenic thrombus. The veins demonstrate good compressibility with normal color flow study and spectral analysis. IMPRESSION: 1. No evidence of DVT. Electronically signed by: Waddell Calk MD 02/20/2024 06:57 AM EDT RP Workstation: [id number]   DG Tibia/Fibula Left Result Date: 02/20/2024 EXAM: 3 VIEW(S) XRAY OF THE LEFT TIBIA AND FIBULA 02/20/2024 05:20:00 AM COMPARISON: None available. CLINICAL HISTORY: Pain, h/o fx. The patient presents with spasms in left tib fib, h/o fracture x 2 months ago. Patient unable to straighten leg. Difficulty finding pulse in leg per RN. FINDINGS: BONES AND JOINTS: Minimally displaced spiral fracture of distal tibial diaphysis and minimally displaced oblique fracture of proximal fibular diaphysis. SOFT TISSUES: Diffuse subcutaneous edema. Vascular calcification and surgical clips noted. IMPRESSION: 1. Minimally displaced spiral fracture of distal tibial diaphysis and minimally displaced oblique fracture of proximal fibular diaphysis. 2. Diffuse subcutaneous edema. Electronically signed by: Waddell Calk MD 02/20/2024 05:24 AM EDT RP Workstation: [id number]      Assessment/Plan 1. Critical Limb Ischemia   Will have the patient undergo angiogram in order to evaluate the perfusion in the left lower extremity.  I discussed with the patient that while he had a recent angiogram given his worsening pain and symptoms that are may have been new occlusions or findings that may make the previous plan untenable.  Will plan on having the patient undergo left lower extremity angiogram.  I discussed with patient that post angiogram we will discuss the next steps whether it is feasible for a redo bypass or if amputation may be the next  step.  Following discussion of risk, benefit and  alternatives the patient is agreeable to proceed.  2. Type 1 Diabetes Continue hypoglycemic medications as already ordered, these medications have been reviewed and there are no changes at this time.  Hgb A1C to be monitored as already arranged by primary service    Plan of care discussed with Dr. Jama  and he is in agreement with plan noted above.   Family Communication:  Total Time:75 minutes I spent 75 minutes in this encounter including personally reviewing extensive medical records, personally reviewing imaging studies and compared to prior scans, counseling the patient, placing orders, coordinating care and performing appropriate documentation  Thank you for allowing us  to participate in the care of this patient.   Murrel Freet E Khloee Garza, NP [facility] [phone] (Office Phone) [phone] (Office Fax) [phone] (Pager)  02/20/2024 1:12 PM  Staff may message me via secure chat in Epic  but this may not receive immediate response,  please page for urgent matters!  Dictation software was used to generate the above note. Typos may occur and escape review, as with typed/written notes. Any error is purely unintentional.  Please contact me directly for clarity if needed.

## 2024-02-20 NOTE — H&P (View-Only) (Signed)
 [facility] Vascular Consult Note  MRN : [id number]  Raymond Lamb is a 38 y.o. (03/25/1986) male who presents with chief complaint of  Chief Complaint  Patient presents with   Hyperglycemia   Spasms  .   Consulting Physician: Lamar Price, MD Reason for consult: Critical limb ischemia History of Present Illness:   [name] is a 38 y.o. male with history of coronary artery disease, hypertension, hyperlipidemia, type 1 diabetes, end-stage renal disease on hemodialysis.  He has previously had multiple vascular interventions done by [facility] and he is followed by Dr. Ave.  He was last seen in their office on 01/31/2024 at which point there was discussion about a redo left lower extremity fem distal bypass.  It was also discussed that a redo bypass may not be possible and they may need to consider an above-knee amputation.  His bypass occlusion has been complicated by the fact that he had a fall 2 months ago and had a tibia and fibula fracture.  Since that time he has not been able to heal.  He presents to [facility] after having suddenly worsening pain and discomfort in his left leg that he describes as cramping.  His leg feels better when he is able to dangle it off of the bed.  During his time here he has underwent a left lower extremity venous duplex to rule out DVT.  Current Facility-Administered Medications  Medication Dose Route Frequency Provider Last Rate Last Admin   acetaminophen  (TYLENOL ) tablet 650 mg  650 mg Oral Q6H PRN Paudel, Keshab, MD       Or   acetaminophen  (TYLENOL ) suppository 650 mg  650 mg Rectal Q6H PRN Roann Gouty, MD       ceFAZolin  (ANCEF ) IVPB 2g/100 mL premix  2 g Intravenous On Call to OR Schnier, Cordella MATSU, MD   Held at 02/20/24 1015   heparin  ADULT infusion 100 units/mL (25000 units/250mL)  1,150 Units/hr Intravenous Continuous Nazari, Walid A, RPH 11.5 mL/hr at 02/20/24 0556 1,150 Units/hr at 02/20/24 0556   insulin  aspart  (novoLOG ) injection 0-5 Units  0-5 Units Subcutaneous QHS Paudel, Keshab, MD       insulin  aspart (novoLOG ) injection 0-6 Units  0-6 Units Subcutaneous TID WC Paudel, Keshab, MD   2 Units at 02/20/24 1100   morphine  (PF) 2 MG/ML injection 2 mg  2 mg Intravenous Q2H PRN Roann Gouty, MD       ondansetron  (ZOFRAN ) tablet 4 mg  4 mg Oral Q6H PRN Roann Gouty, MD       Or   ondansetron  (ZOFRAN ) injection 4 mg  4 mg Intravenous Q6H PRN Paudel, Keshab, MD       polyethylene glycol (MIRALAX  / GLYCOLAX ) packet 17 g  17 g Oral Daily PRN Paudel, Keshab, MD        Past Medical History:  Diagnosis Date   Anemia of chronic disease    CAD (coronary artery disease)    a. 11/2015 Cath: LM nl, LAD 30p, LCX nl, OM2 70-80, RCA 50-77m; b. 08/2020 MV: EF 56%. No isch/infarct; c. 10/2020 NSTEMI in setting of DKA-->Cath: LM nl, LAD nl, D2 70, LCX nl, OM2 70, OM3 70 inf branch, RCA 80p/m-->Med rx.   ESRD (end stage renal disease) (HCC)    Essential hypertension    History of echocardiogram    a. 10/2020 Echo: EF >55%, nl RV fxn.   Mixed hyperlipidemia    PVD (peripheral vascular disease) (HCC)    a. 07/2015  PTA/Stenting RSFA; b. 12/2015 LLE Fem->PTA bypass; c. 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 04/2020 Duplex: patent LLE bypass. Mild prox RSFA stenosis. Stable ABIs.   Stroke [facility])    Tobacco abuse    Type 1 diabetes (HCC)     Past Surgical History:  Procedure Laterality Date   AVF Left     Social History Social History   Tobacco Use   Smoking status: Every Day    Current packs/day: 0.30    Average packs/day: 0.3 packs/day for 20.0 years (6.0 ttl pk-yrs)    Types: Cigarettes   Smokeless tobacco: Never   Tobacco comments:    Currently smoking about six cigarettes/day.  Vaping Use   Vaping status: Never Used  Substance Use Topics   Alcohol use: Never   Drug use: Yes    Types: Marijuana    Comment: rarely    Family History Family History  Problem Relation Age of Onset   Diabetes  type I Other    Clotting disorder Neg Hx     Allergies  Allergen Reactions   Baclofen Itching and Other (See Comments)    Became disoriented, emesis   Iodinated Contrast Media Hives   Lisinopril Swelling   Other Swelling   Oxybenzone     Other reaction(s): Unknown     REVIEW OF SYSTEMS (Negative unless checked)  Constitutional: [] Weight loss  [] Fever  [] Chills Cardiac: [] Chest pain   [] Chest pressure   [] Palpitations   [] Shortness of breath when laying flat   [] Shortness of breath at rest   [] Shortness of breath with exertion. Vascular:  [] Pain in legs with walking   [] Pain in legs at rest   [] Pain in legs when laying flat   [] Claudication   [] Pain in feet when walking  [] Pain in feet at rest  [x] Pain in feet when laying flat   [] History of DVT   [] Phlebitis   [] Swelling in legs   [] Varicose veins   [] Non-healing ulcers Pulmonary:   [] Uses home oxygen   [] Productive cough   [] Hemoptysis   [] Wheeze  [] COPD   [] Asthma Neurologic:  [] Dizziness  [] Blackouts   [] Seizures   [] History of stroke   [] History of TIA  [] Aphasia   [] Temporary blindness   [] Dysphagia   [] Weakness or numbness in arms   [] Weakness or numbness in legs Musculoskeletal:  [] Arthritis   [] Joint swelling   [] Joint pain   [] Low back pain Hematologic:  [] Easy bruising  [] Easy bleeding   [] Hypercoagulable state   [] Anemic  [] Hepatitis Gastrointestinal:  [] Blood in stool   [] Vomiting blood  [] Gastroesophageal reflux/heartburn   [] Difficulty swallowing. Genitourinary:  [] Chronic kidney disease   [] Difficult urination  [] Frequent urination  [] Burning with urination   [] Blood in urine Skin:  [] Rashes   [] Ulcers   [x] Wounds Psychological:  [] History of anxiety   []  History of major depression.  Physical Examination  Vitals:   02/20/24 0819 02/20/24 0900 02/20/24 0930 02/20/24 1100  BP:  (!) 147/99 (!) 150/98 (!) 170/85  Pulse:  72 72 82  Resp:  (!) 9 (!) 9 18  Temp: 97.8 F (36.6 C)   98.9 F (37.2 C)  TempSrc: Oral    Oral  SpO2:  95% 96% 100%  Weight:      Height:       Body mass index is 21.56 kg/m. Gen:  WD/WN, NAD Head: [place]/AT, No temporalis wasting. Prominent temp pulse not noted. Ear/Nose/Throat: Hearing grossly intact, nares w/o erythema or drainage, oropharynx w/o Erythema/Exudate  Eyes: Sclera non-icteric, conjunctiva clear Neck: Trachea midline.  No JVD.  Pulmonary:  Good air movement, respirations not labored, equal bilaterally.  Cardiac: RRR, normal S1, S2. Vascular:  Vessel Right Left  PT Non Palpable Non Palpable  DP Non Palpable Non Palpable   Gastrointestinal: soft, non-tender/non-distended. No guarding/reflex.  Musculoskeletal: M/S 5/5 throughout.  Extremities without ischemic changes.  No deformity or atrophy. No edema. Neurologic: Sensation grossly intact in extremities.  Symmetrical.  Speech is fluent. Motor exam as listed above. Psychiatric: Judgment intact, Mood & affect appropriate for pt's clinical situation. Dermatologic: No rashes or ulcers noted.  No cellulitis or open wounds. Lymph : No Cervical, Axillary, or Inguinal lymphadenopathy.    CBC Lab Results  Component Value Date   WBC 11.7 (H) 02/20/2024   HGB 9.5 (L) 02/20/2024   HCT 30.6 (L) 02/20/2024   MCV 95.3 02/20/2024   PLT 273 02/20/2024    BMET    Component Value Date/Time   NA 137 02/20/2024 0429   K 4.2 02/20/2024 0429   CL 93 (L) 02/20/2024 0429   CO2 27 02/20/2024 0429   GLUCOSE 437 (H) 02/20/2024 0429   BUN 49 (H) 02/20/2024 0429   CREATININE 8.90 (H) 02/20/2024 0429   CALCIUM  9.3 02/20/2024 0429   GFRNONAA 7 (L) 02/20/2024 0429   Estimated Creatinine Clearance: 10.3 mL/min (A) (by C-G formula based on SCr of 8.9 mg/dL (H)).  COAG Lab Results  Component Value Date   INR 1.0 02/20/2024   INR 1.1 10/08/2021    Radiology US  Venous Img Lower Unilateral Left Result Date: 02/20/2024 EXAM: ULTRASOUND DUPLEX OF THE LEFT LOWER EXTREMITY VEINS TECHNIQUE: Duplex ultrasound using B-mode/gray  scaled imaging and Doppler spectral analysis and color flow was obtained of the deep venous structures of the left lower extremity. COMPARISON: None. CLINICAL HISTORY: Calf pain. FINDINGS: The visualized veins of the lower extremity are patent and free of echogenic thrombus. The veins demonstrate good compressibility with normal color flow study and spectral analysis. IMPRESSION: 1. No evidence of DVT. Electronically signed by: Waddell Calk MD 02/20/2024 06:57 AM EDT RP Workstation: [id number]   DG Tibia/Fibula Left Result Date: 02/20/2024 EXAM: 3 VIEW(S) XRAY OF THE LEFT TIBIA AND FIBULA 02/20/2024 05:20:00 AM COMPARISON: None available. CLINICAL HISTORY: Pain, h/o fx. The patient presents with spasms in left tib fib, h/o fracture x 2 months ago. Patient unable to straighten leg. Difficulty finding pulse in leg per RN. FINDINGS: BONES AND JOINTS: Minimally displaced spiral fracture of distal tibial diaphysis and minimally displaced oblique fracture of proximal fibular diaphysis. SOFT TISSUES: Diffuse subcutaneous edema. Vascular calcification and surgical clips noted. IMPRESSION: 1. Minimally displaced spiral fracture of distal tibial diaphysis and minimally displaced oblique fracture of proximal fibular diaphysis. 2. Diffuse subcutaneous edema. Electronically signed by: Waddell Calk MD 02/20/2024 05:24 AM EDT RP Workstation: [id number]      Assessment/Plan 1. Critical Limb Ischemia   Will have the patient undergo angiogram in order to evaluate the perfusion in the left lower extremity.  I discussed with the patient that while he had a recent angiogram given his worsening pain and symptoms that are may have been new occlusions or findings that may make the previous plan untenable.  Will plan on having the patient undergo left lower extremity angiogram.  I discussed with patient that post angiogram we will discuss the next steps whether it is feasible for a redo bypass or if amputation may be the next  step.  Following discussion of risk, benefit and  alternatives the patient is agreeable to proceed.  2. Type 1 Diabetes Continue hypoglycemic medications as already ordered, these medications have been reviewed and there are no changes at this time.  Hgb A1C to be monitored as already arranged by primary service    Plan of care discussed with Dr. Jama  and he is in agreement with plan noted above.   Family Communication:  Total Time:75 minutes I spent 75 minutes in this encounter including personally reviewing extensive medical records, personally reviewing imaging studies and compared to prior scans, counseling the patient, placing orders, coordinating care and performing appropriate documentation  Thank you for allowing us  to participate in the care of this patient.   Murrel Freet E Khloee Garza, NP [facility] [phone] (Office Phone) [phone] (Office Fax) [phone] (Pager)  02/20/2024 1:12 PM  Staff may message me via secure chat in Epic  but this may not receive immediate response,  please page for urgent matters!  Dictation software was used to generate the above note. Typos may occur and escape review, as with typed/written notes. Any error is purely unintentional.  Please contact me directly for clarity if needed.

## 2024-02-20 NOTE — Progress Notes (Signed)
 Glucometer results not flowing into epic - results as follows  @1624  CBG 396 @1747  CBG 383 @1846  CBG 368

## 2024-02-20 NOTE — OR Nursing (Signed)
 Dr Jama called for insulin  coverage for elevated fsbs x 2. 8 units initially given then 10 units (FSBS dropped to 383 from 396)

## 2024-02-20 NOTE — ED Triage Notes (Signed)
 Chief Complaint  Patient presents with   Hyperglycemia   Spasms   Pt arrives to ED with c/o hyperglycemia. Reports he has a dexcom and it was reading HI. EMS reports their FSBS was 489. Pt also c/o L leg spasms that began within the last few days. Pt reports that he broke his leg 2 months ago. Pt reports that he is also a M,W, F dialysis pt.   Past Medical History:  Diagnosis Date   Anemia of chronic disease    CAD (coronary artery disease)    a. 11/2015 Cath: LM nl, LAD 30p, LCX nl, OM2 70-80, RCA 50-39m; b. 08/2020 MV: EF 56%. No isch/infarct; c. 10/2020 NSTEMI in setting of DKA-->Cath: LM nl, LAD nl, D2 70, LCX nl, OM2 70, OM3 70 inf branch, RCA 80p/m-->Med rx.   ESRD (end stage renal disease) (HCC)    Essential hypertension    History of echocardiogram    a. 10/2020 Echo: EF >55%, nl RV fxn.   Mixed hyperlipidemia    PVD (peripheral vascular disease) (HCC)    a. 07/2015 PTA/Stenting RSFA; b. 12/2015 LLE Fem->PTA bypass; c. 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 04/2020 Duplex: patent LLE bypass. Mild prox RSFA stenosis. Stable ABIs.   Stroke [facility])    Tobacco abuse    Type 1 diabetes (HCC)

## 2024-02-20 NOTE — H&P (Signed)
 History and Physical    Raymond Lamb FMW:[id number] DOB: 1985-07-04 DOA: 02/20/2024  DOS: the patient was seen and examined on 02/20/2024  PCP: Ivar Bump, MD   Patient coming from: Home  I have personally briefly reviewed patient's old medical records in [facility]  Chief Complaint: Left lower extremity pain  HPI: Raymond Lamb is a pleasant 38 y.o. male with medical history significant for CAD, HTN, HLD, type I DM, ESRD on hemodialysis Monday Wednesday Friday, anemia, PVD, history of stroke, tobacco abuse presented to ED at [facility] with complaints of left leg pain.  Patient had a bypass of left lower extremity by Dr.Pascarella at [facility] (last seen in office 01/31/24).  Patient is stated that he has left leg pain for the last 2 months since he had a fall in his bathroom had tibia and fibula fracture.  He is in a boot.  He does not ambulate at baseline and uses a wheelchair.  He denies any new injury but stated that 2 to 3 hours prior to coming to ED last night he had worsening pain on his left leg.  Pain he described as cramping constant not getting better.  He had a history of left upper extremity DVT.  He denied any fever, chills, chest pain, palpitations.  He also said that he has not missed dialysis.  He is due today.  ED Course: Upon arrival to the ED, patient is found to be hypertensive at 178/101 left upper extremity AV fistula good ultrasound of left lower extremity showed no DVT, x-ray of the left lower extremity showed minimally displaced spiral fracture of the distal tibial diaphysis and minimally displaced oblique fracture of proximal Peavler diaphysis.  ED tried to send the patient to [facility] where he had been followed up but they were not able to take him and vascular surgery team at [facility] was consulted.  Vascular team is willing to work with him for further treatment planning.  Hospitalist service was consulted for evaluation for admission.  Review of Systems:  ROS  All other  systems negative except as noted in the HPI.  Past Medical History:  Diagnosis Date   Anemia of chronic disease    CAD (coronary artery disease)    a. 11/2015 Cath: LM nl, LAD 30p, LCX nl, OM2 70-80, RCA 50-76m; b. 08/2020 MV: EF 56%. No isch/infarct; c. 10/2020 NSTEMI in setting of DKA-->Cath: LM nl, LAD nl, D2 70, LCX nl, OM2 70, OM3 70 inf branch, RCA 80p/m-->Med rx.   ESRD (end stage renal disease) (HCC)    Essential hypertension    History of echocardiogram    a. 10/2020 Echo: EF >55%, nl RV fxn.   Mixed hyperlipidemia    PVD (peripheral vascular disease) (HCC)    a. 07/2015 PTA/Stenting RSFA; b. 12/2015 LLE Fem->PTA bypass; c. 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 04/2020 Duplex: patent LLE bypass. Mild prox RSFA stenosis. Stable ABIs.   Stroke [facility])    Tobacco abuse    Type 1 diabetes (HCC)     Past Surgical History:  Procedure Laterality Date   AVF Left      reports that he has been smoking cigarettes. He has a 6 pack-year smoking history. He has never used smokeless tobacco. He reports current drug use. Drug: Marijuana. He reports that he does not drink alcohol.  Allergies  Allergen Reactions   Baclofen Itching and Other (See Comments)    Became disoriented, emesis   Iodinated Contrast Media Hives  Lisinopril Swelling   Other Swelling   Oxybenzone     Other reaction(s): Unknown    Family History  Problem Relation Age of Onset   Diabetes type I Other    Clotting disorder Neg Hx     Prior to Admission medications   Medication Sig Start Date End Date Taking? Authorizing Provider  amLODipine  (NORVASC ) 10 MG tablet Take 10 mg by mouth daily. 10/14/21   [provider]  aspirin  81 MG chewable tablet Chew 81 mg by mouth daily. 11/25/20   [provider]  atorvastatin  (LIPITOR ) 80 MG tablet Take 80 mg by mouth every evening. 09/29/20   [provider]  cinacalcet  (SENSIPAR ) 60 MG tablet Take 120 mg by mouth daily. 11/08/20   [provider]  clopidogrel  (PLAVIX ) 75 MG tablet Take 75 mg by mouth daily. 08/30/20   [provider]  ezetimibe  (ZETIA ) 10 MG tablet Take 10 mg by mouth daily. 02/10/21 02/10/22  [provider]  gabapentin  (NEURONTIN ) 300 MG capsule Take 600 mg by mouth at bedtime. 09/28/20   [provider]  hydrALAZINE  (APRESOLINE ) 50 MG tablet Take 50 mg by mouth 3 (three) times daily. 10/14/21   [provider]  isosorbide  mononitrate (IMDUR ) 30 MG 24 hr tablet Take 30 mg by mouth daily. 10/14/21   [provider]  LANTUS  SOLOSTAR 100 UNIT/ML Solostar Pen Inject 13 Units into the skin at bedtime. 12/17/20   Jens Durand, MD  metoprolol  succinate (TOPROL -XL) 100 MG 24 hr tablet Take 100 mg by mouth daily. 10/14/21   [provider]  nitroGLYCERIN  (NITROSTAT ) 0.4 MG SL tablet Place 1 tablet under the tongue daily as needed. 11/20/20   [provider]  NOVOLOG  FLEXPEN 100 UNIT/ML FlexPen Inject 5 Units into the skin 3 (three) times daily. Use per sliding scale no more then 50 units a day Patient taking differently: Inject 2-6 Units into the skin 3 (three) times daily. - Take Novolog  2 units with meals. This is ONLY if you eat. In addition to this, you should add: - 1 unit if your pre-meal blood sugar is 150-250 - 2 units if your pre-meal blood sugar is 251-350 - 3 units if your pre-meal blood sugar is 351-450 - 4 units if your pre-meal blood sugar is 451+ 12/02/20   Dickie Begun, MD  omeprazole (PRILOSEC) 40 MG capsule Take 40 mg by mouth daily with breakfast. 08/05/21   [provider]  sertraline  (ZOLOFT ) 100 MG tablet Take 150 mg by mouth in the morning. 11/25/20   [provider]    Physical Exam: Vitals:   02/20/24 0930 02/20/24 1100 02/20/24 1454 02/20/24 1501  BP: (!) 150/98 (!) 170/85 (!) 164/92 (!) 172/91  Pulse: 72 82 91   Resp: (!) 9 18 20 12   Temp:  98.9 F (37.2 C) 97.8 F (36.6 C)   TempSrc:  Oral Tympanic   SpO2: 96%  100% 95%   Weight:   66.2 kg   Height:   5' 9 (1.753 m)     Physical Exam   Constitutional: Alert, awake, calm, comfortable HEENT: Neck supple Respiratory: Clear to auscultation B/L, no wheezing, no rales.  Cardiovascular: Regular rate and rhythm, no murmurs / rubs / gallops. No extremity edema. 2+ pedal pulses. No carotid bruits.  Abdomen: Soft, no tenderness, Bowel sounds positive.  Musculoskeletal: Left lower extremity is slightly contracted, tender, no external injuries noted Neurologic: CN 2-12 grossly intact. Sensation intact, No focal deficit identified Psychiatric: Alert and  oriented x 3. Normal mood.    Labs on Admission: I have personally reviewed following labs and imaging studies  CBC: Recent Labs  Lab 02/20/24 0429  WBC 11.7*  NEUTROABS 7.4  HGB 9.5*  HCT 30.6*  MCV 95.3  PLT 273   Basic Metabolic Panel: Recent Labs  Lab 02/20/24 0429  NA 137  K 4.2  CL 93*  CO2 27  GLUCOSE 437*  BUN 49*  CREATININE 8.90*  CALCIUM  9.3  MG 1.7   GFR: Estimated Creatinine Clearance: 10.3 mL/min (A) (by C-G formula based on SCr of 8.9 mg/dL (H)). Liver Function Tests: Recent Labs  Lab 02/20/24 0429  AST 15  ALT 13  ALKPHOS 81  BILITOT 0.3  PROT 7.6  ALBUMIN 3.4*   No results for input(s): LIPASE, AMYLASE in the last 168 hours. No results for input(s): AMMONIA in the last 168 hours. Coagulation Profile: Recent Labs  Lab 02/20/24 0429  INR 1.0   Cardiac Enzymes: No results for input(s): CKTOTAL, CKMB, CKMBINDEX, TROPONINI, TROPONINIHS in the last 168 hours. BNP (last 3 results) No results for input(s): BNP in the last 8760 hours. HbA1C: No results for input(s): HGBA1C in the last 72 hours. CBG: Recent Labs  Lab 02/20/24 0416 02/20/24 0604 02/20/24 1046  GLUCAP 401* 276* 214*   Lipid Profile: No results for input(s): CHOL, HDL, LDLCALC, TRIG, CHOLHDL, LDLDIRECT in the last 72 hours. Thyroid Function Tests: No  results for input(s): TSH, T4TOTAL, FREET4, T3FREE, THYROIDAB in the last 72 hours. Anemia Panel: No results for input(s): VITAMINB12, FOLATE, FERRITIN, TIBC, IRON , RETICCTPCT in the last 72 hours. Urine analysis: No results found for: COLORURINE, APPEARANCEUR, LABSPEC, PHURINE, GLUCOSEU, HGBUR, BILIRUBINUR, KETONESUR, PROTEINUR, UROBILINOGEN, NITRITE, LEUKOCYTESUR  Radiological Exams on Admission: I have personally reviewed images US  Venous Img Lower Unilateral Left Result Date: 02/20/2024 EXAM: ULTRASOUND DUPLEX OF THE LEFT LOWER EXTREMITY VEINS TECHNIQUE: Duplex ultrasound using B-mode/gray scaled imaging and Doppler spectral analysis and color flow was obtained of the deep venous structures of the left lower extremity. COMPARISON: None. CLINICAL HISTORY: Calf pain. FINDINGS: The visualized veins of the lower extremity are patent and free of echogenic thrombus. The veins demonstrate good compressibility with normal color flow study and spectral analysis. IMPRESSION: 1. No evidence of DVT. Electronically signed by: Waddell Calk MD 02/20/2024 06:57 AM EDT RP Workstation: [id number]   DG Tibia/Fibula Left Result Date: 02/20/2024 EXAM: 3 VIEW(S) XRAY OF THE LEFT TIBIA AND FIBULA 02/20/2024 05:20:00 AM COMPARISON: None available. CLINICAL HISTORY: Pain, h/o fx. The patient presents with spasms in left tib fib, h/o fracture x 2 months ago. Patient unable to straighten leg. Difficulty finding pulse in leg per RN. FINDINGS: BONES AND JOINTS: Minimally displaced spiral fracture of distal tibial diaphysis and minimally displaced oblique fracture of proximal fibular diaphysis. SOFT TISSUES: Diffuse subcutaneous edema. Vascular calcification and surgical clips noted. IMPRESSION: 1. Minimally displaced spiral fracture of distal tibial diaphysis and minimally displaced oblique fracture of proximal fibular diaphysis. 2. Diffuse subcutaneous edema. Electronically signed  by: Waddell Calk MD 02/20/2024 05:24 AM EDT RP Workstation: [id number]    EKG: My personal interpretation of EKG shows: Sinus rhythm at 94 bpm, no ST elevation    Assessment/Plan Principal Problem:   Critical limb ischemia of left lower extremity with ulceration of lower leg (HCC) Active Problems:   Anemia in ESRD (end-stage renal disease) (HCC)   ESRD (end stage renal disease) (HCC)   History of CVA (cerebrovascular accident)   PVD (peripheral vascular disease) (HCC)  Type 1 diabetes mellitus with hypoglycemia without coma (HCC)   HTN (hypertension)    Assessment and Plan:  This is a 38 year old male W/PMH of CAD, ESRD on hemodialysis, HTN, HLD, PVD, stroke, type I DM on insulin  pump who came into ED for left lower extremity pain.  This could be critical left lower extremity ischemia.  Vascular team has evaluated the patient and plan for possible angiogram.  1.  Left lower extremity critical limb ischemia - He will be admitted to hospital as inpatient - Vascular team already seen the patient and planning on doing the angiogram. - Patient was not able to do the CT angio of the lower extremity due to he was not able to sit or lie down straight. - Patient has been on heparin  drip pain medication and follow-up for vascular team.  2.  Type 1 diabetes - Patient is to have insulin  pump - Will place him on insulin  sliding scale, Lantus  and short acting insulin  - Continue to monitor blood sugars  3.  HTN - Blood pressure is stable - Continue to monitor - Continue blood pressure medications  4.  ESRD on hemodialysis Monday Wednesday Friday - Nephrology will be consulted for dialysis  5.  Anemia in ESRD - Continue to monitor hemoglobin hematocrit  6.  HLD - Continue statin   DVT prophylaxis: Paren drip Code Status: Full Code Family Communication: None  Disposition Plan: Home vs rehab  Consults called: Vascular/Nephrology  Admission status: Inpatient, Telemetry  bed   Nena Rebel, MD [facility] 02/20/2024, 3:43 PM

## 2024-02-20 NOTE — ED Provider Notes (Signed)
 CT is unable to perform the angiography because he is unable to straighten his leg which would lead to half of the leg being outside of the field of view   Arlander Charleston, MD 02/20/24 [phone]

## 2024-02-20 NOTE — Progress Notes (Signed)
 Pt. Med. With Morphine  2 mg slow IVP for c/o left ankle and foot pain 7 on scale 1-10. Pt. Mother attempting to straigted leg /knee area out manually. Pt. Has contracted Left arm. States his left leg has been partially contracted since stroke in 2016.

## 2024-02-20 NOTE — Consult Note (Signed)
 PHARMACY - ANTICOAGULATION CONSULT NOTE  Pharmacy Consult for Heparin  Infusion Indication: limb ischemia  Allergies  Allergen Reactions   Baclofen Itching and Other (See Comments)    Became disoriented, emesis   Camellia Swelling   Iodinated Contrast Media Hives   Lisinopril Swelling   Other Swelling   Oxybenzone     Other reaction(s): Unknown    Patient Measurements: Height: 5' 9 (175.3 cm) Weight: 66.2 kg (145 lb 15.1 oz) IBW/kg (Calculated) : 70.7 HEPARIN  DW (KG): 66.2  Vital Signs: Temp: 97.8 F (36.6 C) (08/27 1454) Temp Source: Tympanic (08/27 1454) BP: 172/91 (08/27 1501) Pulse Rate: 91 (08/27 1454)  Labs: Recent Labs    02/20/24 0429 02/20/24 1426  HGB 9.5*  --   HCT 30.6*  --   PLT 273  --   APTT 51*  --   LABPROT 14.2  --   INR 1.0  --   HEPARINUNFRC  --  0.42  CREATININE 8.90*  --     Estimated Creatinine Clearance: 10.3 mL/min (A) (by C-G formula based on SCr of 8.9 mg/dL (H)).   Medical History: Past Medical History:  Diagnosis Date   Anemia of chronic disease    CAD (coronary artery disease)    a. 11/2015 Cath: LM nl, LAD 30p, LCX nl, OM2 70-80, RCA 50-41m; b. 08/2020 MV: EF 56%. No isch/infarct; c. 10/2020 NSTEMI in setting of DKA-->Cath: LM nl, LAD nl, D2 70, LCX nl, OM2 70, OM3 70 inf branch, RCA 80p/m-->Med rx.   ESRD (end stage renal disease) (HCC)    Essential hypertension    History of echocardiogram    a. 10/2020 Echo: EF >55%, nl RV fxn.   Mixed hyperlipidemia    PVD (peripheral vascular disease) (HCC)    a. 07/2015 PTA/Stenting RSFA; b. 12/2015 LLE Fem->PTA bypass; c. 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 04/2020 Duplex: patent LLE bypass. Mild prox RSFA stenosis. Stable ABIs.   Stroke [facility])    Tobacco abuse    Type 1 diabetes (HCC)     Medications:  No history of chronic anticoagulation PTA  Assessment: 38yo male with history of coronary artery disease, hypertension, hyperlipidemia, type 1 diabetes, end-stage renal  disease on hemodialysis Monday/Wednesday/Friday, peripheral vascular disease status post left lower extremity bypass presents to ED with complaints of hyperglycemia and leg spasms that began with the last few days. Patient also reports he broke his leg 2 months ago. Patient follows with [facility] vascular. Due to concern for possible ischemic limb, pharmacy has been consulted to initiate and dose continuous heparin  infusion.  Baseline labs: aPTT 51 sec, INR 1.0, Plts 273, Hgb 9.5  8/27 1426 HL 0.42,  therapeutic x 1  Goal of Therapy:  Heparin  level 0.3-0.7 units/ml Monitor platelets by anticoagulation protocol: Yes   Plan:  8/27 1426 HL 0.42,  therapeutic x 1 Continue heparin  infusion at 1150 units/hr Check confirmatory anti-Xa level in 8 hours and daily while on heparin  Continue to monitor H&H and platelets  Allean Haas PharmD Clinical Pharmacist 02/20/2024

## 2024-02-21 ENCOUNTER — Encounter: Payer: Self-pay | Admitting: Vascular Surgery

## 2024-02-21 DIAGNOSIS — I998 Other disorder of circulatory system: Secondary | ICD-10-CM

## 2024-02-21 DIAGNOSIS — I739 Peripheral vascular disease, unspecified: Secondary | ICD-10-CM

## 2024-02-21 DIAGNOSIS — I70248 Atherosclerosis of native arteries of left leg with ulceration of other part of lower left leg: Secondary | ICD-10-CM | POA: Diagnosis not present

## 2024-02-21 LAB — COMPREHENSIVE METABOLIC PANEL WITH GFR
ALT: 11 U/L (ref 0–44)
AST: 10 U/L — ABNORMAL LOW (ref 15–41)
Albumin: 2.9 g/dL — ABNORMAL LOW (ref 3.5–5.0)
Alkaline Phosphatase: 66 U/L (ref 38–126)
Anion gap: 16 — ABNORMAL HIGH (ref 5–15)
BUN: 75 mg/dL — ABNORMAL HIGH (ref 6–20)
CO2: 21 mmol/L — ABNORMAL LOW (ref 22–32)
Calcium: 9.1 mg/dL (ref 8.9–10.3)
Chloride: 97 mmol/L — ABNORMAL LOW (ref 98–111)
Creatinine, Ser: 10.41 mg/dL — ABNORMAL HIGH (ref 0.61–1.24)
GFR, Estimated: 6 mL/min — ABNORMAL LOW (ref >60)
Glucose, Bld: 351 mg/dL — ABNORMAL HIGH (ref 70–99)
Potassium: 4.8 mmol/L (ref 3.5–5.1)
Sodium: 134 mmol/L — ABNORMAL LOW (ref 135–145)
Total Bilirubin: 0.6 mg/dL (ref 0.0–1.2)
Total Protein: 6.8 g/dL (ref 6.5–8.1)

## 2024-02-21 LAB — CBC
HCT: 27.9 % — ABNORMAL LOW (ref 39.0–52.0)
Hemoglobin: 9 g/dL — ABNORMAL LOW (ref 13.0–17.0)
MCH: 30 pg (ref 26.0–34.0)
MCHC: 32.3 g/dL (ref 30.0–36.0)
MCV: 93 fL (ref 80.0–100.0)
Platelets: 277 K/uL (ref 150–400)
RBC: 3 MIL/uL — ABNORMAL LOW (ref 4.22–5.81)
RDW: 15.5 % (ref 11.5–15.5)
WBC: 11.6 K/uL — ABNORMAL HIGH (ref 4.0–10.5)
nRBC: 0 % (ref 0.0–0.2)

## 2024-02-21 LAB — PROTIME-INR
INR: 1.2 (ref 0.8–1.2)
Prothrombin Time: 15.8 s — ABNORMAL HIGH (ref 11.4–15.2)

## 2024-02-21 LAB — HEMOGLOBIN A1C
Hgb A1c MFr Bld: 9.4 % — ABNORMAL HIGH (ref 4.8–5.6)
Mean Plasma Glucose: 223 mg/dL

## 2024-02-21 LAB — GLUCOSE, CAPILLARY
Glucose-Capillary: 383 mg/dL — ABNORMAL HIGH (ref 70–99)
Glucose-Capillary: 341 mg/dL — ABNORMAL HIGH (ref 70–99)
Glucose-Capillary: 368 mg/dL — ABNORMAL HIGH (ref 70–99)
Glucose-Capillary: 376 mg/dL — ABNORMAL HIGH (ref 70–99)
Glucose-Capillary: 499 mg/dL — ABNORMAL HIGH (ref 70–99)

## 2024-02-21 LAB — HEPARIN LEVEL (UNFRACTIONATED): Heparin Unfractionated: 0.22 [IU]/mL — ABNORMAL LOW (ref 0.30–0.70)

## 2024-02-21 LAB — HEPATITIS B SURFACE ANTIGEN: Hepatitis B Surface Ag: NONREACTIVE

## 2024-02-21 MED ORDER — HEPARIN BOLUS VIA INFUSION
1000.0000 [IU] | Freq: Once | INTRAVENOUS | Status: AC
  Administered 2024-02-21: 1000 [IU] via INTRAVENOUS
  Filled 2024-02-21: qty 1000

## 2024-02-21 MED ORDER — CHLORHEXIDINE GLUCONATE CLOTH 2 % EX PADS: 6.0000 | MEDICATED_PAD | Freq: Every day | CUTANEOUS | Status: DC

## 2024-02-21 MED ORDER — INSULIN GLARGINE 100 UNIT/ML ~~LOC~~ SOLN
5.0000 [IU] | Freq: Every day | SUBCUTANEOUS | Status: DC
  Filled 2024-02-21: qty 0.05

## 2024-02-21 MED ORDER — IODIXANOL 320 MG/ML IV SOLN
INTRAVENOUS | Status: DC | PRN
  Administered 2024-02-20: 85 mL

## 2024-02-21 MED ORDER — INSULIN ASPART 100 UNIT/ML IJ SOLN
5.0000 [IU] | Freq: Three times a day (TID) | INTRAMUSCULAR | Status: DC
  Administered 2024-02-21: 5 [IU] via SUBCUTANEOUS
  Filled 2024-02-21: qty 1

## 2024-02-21 NOTE — Discharge Summary (Signed)
 Raymond Lamb FMW:[id number] DOB: 1986/01/06 DOA: 02/20/2024  PCP: Ivar Bump, MD  Admit date: 02/20/2024 Discharge date: 02/21/2024  Time spent: 35 minutes  Recommendations for Outpatient Follow-up:  Vascular surgery f/u tomorrow as scheduled Orthopedics f/u next week as scheduled     Discharge Diagnoses:  Principal Problem:   Critical limb ischemia of left lower extremity with ulceration of lower leg (HCC) Active Problems:   Anemia in ESRD (end-stage renal disease) (HCC)   ESRD (end stage renal disease) (HCC)   History of CVA (cerebrovascular accident)   PVD (peripheral vascular disease) (HCC)   Type 1 diabetes mellitus with hypoglycemia without coma (HCC)   CAD (coronary artery disease)   HTN (hypertension)   Discharge Condition: improved  Diet recommendation: heart healthy  Filed Weights   02/20/24 0528 02/20/24 1454  Weight: 66.2 kg 66.2 kg    History of present illness:  From admission h and p: Raymond Lamb is a pleasant 38 y.o. male with medical history significant for CAD, HTN, HLD, type I DM, ESRD on hemodialysis Monday Wednesday Friday, anemia, PVD, history of stroke, tobacco abuse presented to ED at [facility] with complaints of left leg pain.  Patient had a bypass of left lower extremity by Dr.Pascarella at [facility] (last seen in office 01/31/24).  Patient is stated that he has left leg pain for the last 2 months since he had a fall in his bathroom had tibia and fibula fracture.  He is in a boot.  He does not ambulate at baseline and uses a wheelchair.  He denies any new injury but stated that 2 to 3 hours prior to coming to ED last night he had worsening pain on his left leg.  Pain he described as cramping constant not getting better.  He had a history of left upper extremity DVT.  He denied any fever, chills, chest pain, palpitations.  He also said that he has not missed dialysis.  He is due today.   Hospital Course:   Hx PAD presents with constant pain in the left  lower extremity. Vascular consulted, diagnosed with rest claudication in the left leg, taken for angiogram where angioplasty of the left profunda femoris artery was performed. On hospital day 1 patient reports resolution of his pain. Stable for discharge from vascular perspective, has outpatient vascular surgery f/u tomorrow. For patient's left tib/fib fracture, vascular says ok to resume the knee immobilizer and cam boot, patient has ortho f/u scheduled next week. Patient is on mwf hemodialysis; nephrology saw here and advised resuming outpatient schedule. DM remains uncontrolled, will need outpt f/u with diabetes provider.   Procedures: Angiogram with angioplasty as above   Consultations: Vascular surgery  Discharge Exam: Vitals:   02/21/24 0450 02/21/24 0907  BP: (!) 142/76 (!) 177/84  Pulse: 76 85  Resp: 18 17  Temp: (!) 97.5 F (36.4 C) 97.7 F (36.5 C)  SpO2: 100% 100%    General: NAD Cardiovascular: RRR Respiratory: CTAB Ext: warm  Discharge Instructions   Discharge Instructions     Diet - low sodium heart healthy   Complete by: As directed    Increase activity slowly   Complete by: As directed       Allergies as of 02/21/2024       Reactions   Baclofen Itching, Other (See Comments)   Became disoriented, emesis   Camellia Swelling   Iodinated Contrast Media Hives   Lisinopril Swelling   Other Swelling   Oxybenzone    Other reaction(s): Unknown  Medication List     STOP taking these medications    HYDROcodone-acetaminophen  5-325 MG tablet Commonly known as: NORCO/VICODIN   oxyCODONE 5 MG immediate release tablet Commonly known as: Oxy IR/ROXICODONE       TAKE these medications    aspirin  81 MG chewable tablet Chew 81 mg by mouth daily.   atorvastatin  80 MG tablet Commonly known as: LIPITOR  Take 80 mg by mouth every evening.   Auryxia  1 GM 210 MG(Fe) tablet Generic drug: ferric citrate  Take 420 mg by mouth 3 (three) times daily  with meals.   Cholecalciferol 50 MCG (2000 UT) Tabs Take 1 tablet by mouth daily.   cinacalcet  90 MG tablet Commonly known as: SENSIPAR  Take 90 mg by mouth every morning.   clopidogrel  75 MG tablet Commonly known as: PLAVIX  Take 75 mg by mouth daily.   cyclobenzaprine  10 MG tablet Commonly known as: FLEXERIL  Take 10 mg by mouth as needed for muscle spasms.   ezetimibe  10 MG tablet Commonly known as: ZETIA  Take 10 mg by mouth daily.   gabapentin  300 MG capsule Commonly known as: NEURONTIN  Take 600 mg by mouth at bedtime.   insulin  lispro 100 UNIT/ML injection Commonly known as: HUMALOG Inject 50 Units into the skin daily. Uses in Pump   isosorbide  mononitrate 30 MG 24 hr tablet Commonly known as: IMDUR  Take 30 mg by mouth daily.   metoprolol  succinate 100 MG 24 hr tablet Commonly known as: TOPROL -XL Take 100 mg by mouth daily.   mirtazapine  30 MG tablet Commonly known as: REMERON  Take 30 mg by mouth at bedtime.   pantoprazole  40 MG tablet Commonly known as: PROTONIX  Take 40 mg by mouth daily.   sertraline  100 MG tablet Commonly known as: ZOLOFT  Take 100 mg by mouth in the morning.   tiZANidine 2 MG tablet Commonly known as: ZANAFLEX Take 2 mg by mouth 2 (two) times daily as needed for muscle spasms.   triamcinolone ointment 0.1 % Commonly known as: KENALOG Apply 1 Application topically 2 (two) times daily.   Velphoro 500 MG chewable tablet Generic drug: sucroferric oxyhydroxide Chew 500 mg by mouth 3 (three) times daily with meals.   Veltassa  16.8 g Pack Generic drug: Patiromer  Sorbitex Calcium  Take 1 packet by mouth daily.       Allergies  Allergen Reactions   Baclofen Itching and Other (See Comments)    Became disoriented, emesis   Camellia Swelling   Iodinated Contrast Media Hives   Lisinopril Swelling   Other Swelling   Oxybenzone     Other reaction(s): Unknown    Follow-up Information     your [facility] vascular surgery team Follow up.    Why: tomorrow as scheduled        your orthopedic surgeon Follow up.   Why: next week as scheduled                 The results of significant diagnostics from this hospitalization (including imaging, microbiology, ancillary and laboratory) are listed below for reference.    Significant Diagnostic Studies: PERIPHERAL VASCULAR CATHETERIZATION Result Date: 02/20/2024 See surgical note for result.  US  Venous Img Lower Unilateral Left Result Date: 02/20/2024 EXAM: ULTRASOUND DUPLEX OF THE LEFT LOWER EXTREMITY VEINS TECHNIQUE: Duplex ultrasound using B-mode/gray scaled imaging and Doppler spectral analysis and color flow was obtained of the deep venous structures of the left lower extremity. COMPARISON: None. CLINICAL HISTORY: Calf pain. FINDINGS: The visualized veins of the lower extremity are patent and free of echogenic  thrombus. The veins demonstrate good compressibility with normal color flow study and spectral analysis. IMPRESSION: 1. No evidence of DVT. Electronically signed by: Waddell Calk MD 02/20/2024 06:57 AM EDT RP Workstation: [id number]   DG Tibia/Fibula Left Result Date: 02/20/2024 EXAM: 3 VIEW(S) XRAY OF THE LEFT TIBIA AND FIBULA 02/20/2024 05:20:00 AM COMPARISON: None available. CLINICAL HISTORY: Pain, h/o fx. The patient presents with spasms in left tib fib, h/o fracture x 2 months ago. Patient unable to straighten leg. Difficulty finding pulse in leg per RN. FINDINGS: BONES AND JOINTS: Minimally displaced spiral fracture of distal tibial diaphysis and minimally displaced oblique fracture of proximal fibular diaphysis. SOFT TISSUES: Diffuse subcutaneous edema. Vascular calcification and surgical clips noted. IMPRESSION: 1. Minimally displaced spiral fracture of distal tibial diaphysis and minimally displaced oblique fracture of proximal fibular diaphysis. 2. Diffuse subcutaneous edema. Electronically signed by: Waddell Calk MD 02/20/2024 05:24 AM EDT RP Workstation:  [id number]    Microbiology: No results found for this or any previous visit (from the past 240 hours).   Labs: Basic Metabolic Panel: Recent Labs  Lab 02/20/24 0429 02/21/24 0446  NA 137 134*  K 4.2 4.8  CL 93* 97*  CO2 27 21*  GLUCOSE 437* 351*  BUN 49* 75*  CREATININE 8.90* 10.41*  CALCIUM  9.3 9.1  MG 1.7  --    Liver Function Tests: Recent Labs  Lab 02/20/24 0429 02/21/24 0446  AST 15 10*  ALT 13 11  ALKPHOS 81 66  BILITOT 0.3 0.6  PROT 7.6 6.8  ALBUMIN 3.4* 2.9*   No results for input(s): LIPASE, AMYLASE in the last 168 hours. No results for input(s): AMMONIA in the last 168 hours. CBC: Recent Labs  Lab 02/20/24 0429 02/21/24 0446  WBC 11.7* 11.6*  NEUTROABS 7.4  --   HGB 9.5* 9.0*  HCT 30.6* 27.9*  MCV 95.3 93.0  PLT 273 277   Cardiac Enzymes: No results for input(s): CKTOTAL, CKMB, CKMBINDEX, TROPONINI in the last 168 hours. BNP: BNP (last 3 results) No results for input(s): BNP in the last 8760 hours.  ProBNP (last 3 results) No results for input(s): PROBNP in the last 8760 hours.  CBG: Recent Labs  Lab 02/20/24 2017 02/20/24 2041 02/20/24 2137 02/21/24 0605 02/21/24 0826  GLUCAP 319* 291* 259* 376* 499*       Signed:  Devaughn KATHEE Ban MD.  [facility] 02/21/2024, 9:50 AM

## 2024-02-21 NOTE — Progress Notes (Deleted)
 Patient with poor functioning catheter. Activase  has been instilled into each hemodialysis catheter to improve function

## 2024-02-21 NOTE — Inpatient Diabetes Management (Signed)
 Inpatient Diabetes Program Recommendations  AACE/ADA: New Consensus Statement on Inpatient Glycemic Control   Target Ranges:  Prepandial:   less than 140 mg/dL      Peak postprandial:   less than 180 mg/dL (1-2 hours)      Critically ill patients:  140 - 180 mg/dL    Latest Reference Range & Units 02/20/24 06:04 02/20/24 10:46 02/20/24 14:52 02/20/24 16:24 02/20/24 20:17 02/20/24 20:41 02/20/24 21:37 02/21/24 06:05  Glucose-Capillary 70 - 99 mg/dL 723 (H) 785 (H) 718 (H) 396 (H) 319 (H) 291 (H) 259 (H) 376 (H)    Review of Glycemic Control  Diabetes history: DM1 Outpatient Diabetes medications: OmniPod 5 (basal 0.35 units/hr (8.4 units per 24 hours); I:CR 1:1, I:SF 1:90 mg/dl); off pump plan Lantus  8 units at bedtime, insulin  lispro 3 units TID with meals plus 1:50 mg/dl >859 mg/dl for correction  Current orders for Inpatient glycemic control: Lantus  7 units at bedtime,  Novolog  0-6 units TID, Novolog  0-5 units at bedtime, Novolog  3 units TID   Inpatient Diabetes Program Recommendations:    Insulin : Patient received Solumedrol 40 mg at 4:56 am and Solumedrol 125 mg/dl at 84:93 on 1/72/74. CBG 376 mg/dl this morning.  Please consider ordering one time Lantus  5 units x1 now and increasing meal coverage to Novolog  5 units TID with meals.   Thanks, Earnie Gainer, RN, MSN, CDCES Diabetes Coordinator Inpatient Diabetes Program [phone] (Team Pager from 8am to 5pm)

## 2024-02-21 NOTE — Progress Notes (Signed)
 Progress Note    02/21/2024 7:21 AM 1 Day Post-Op  Subjective:  Raymond Lamb is a 38 yo male now POD #1 from:   Procedure(s) Performed:             1.  Introduction catheter into left lower extremity 3rd order catheter placement              2.    Contrast injection left lower extremity for distal runoff             3.  Percutaneous transluminal angioplasty left profunda femoris artery.             4.  Star close closure right common femoral arteriotomy  Patient is resting comfortably in bed this morning.  He endorses that his left leg feels much better this morning.  He is not having resting pain that he had prior.  No complaints overnight.  Vitals are remained stable.   Vitals:   02/21/24 0050 02/21/24 0450  BP: 126/70 (!) 142/76  Pulse: 85 76  Resp: 18 18  Temp: 98.6 F (37 C) (!) 97.5 F (36.4 C)  SpO2: 98% 100%   Physical Exam: Cardiac:  RRR.  Normal S1 and S2, no murmurs. Lungs: Nonlabored breathing, clear on auscultation throughout, no rales rhonchi or wheezing noted. Incisions: Right groin puncture site with dressing clean dry and intact.  No hematoma seroma no. Extremities: All extremities warm to touch this morning.  Left lower extremity with very weak palpable pulses.  Right lower extremity with stronger palpable pulses.  No peripheral edema to note Abdomen: Positive bowel sounds throughout, soft, nontender nondistended. Neurologic: Alert and oriented x 3, answers all questions follows commands appropriately.  CBC    Component Value Date/Time   WBC 11.6 (H) 02/21/2024 0446   RBC 3.00 (L) 02/21/2024 0446   HGB 9.0 (L) 02/21/2024 0446   HCT 27.9 (L) 02/21/2024 0446   PLT 277 02/21/2024 0446   MCV 93.0 02/21/2024 0446   MCH 30.0 02/21/2024 0446   MCHC 32.3 02/21/2024 0446   RDW 15.5 02/21/2024 0446   LYMPHSABS 2.4 02/20/2024 0429   MONOABS 1.0 02/20/2024 0429   EOSABS 0.7 (H) 02/20/2024 0429   BASOSABS 0.1 02/20/2024 0429    BMET    Component  Value Date/Time   NA 134 (L) 02/21/2024 0446   K 4.8 02/21/2024 0446   CL 97 (L) 02/21/2024 0446   CO2 21 (L) 02/21/2024 0446   GLUCOSE 351 (H) 02/21/2024 0446   BUN 75 (H) 02/21/2024 0446   CREATININE 10.41 (H) 02/21/2024 0446   CALCIUM  9.1 02/21/2024 0446   GFRNONAA 6 (L) 02/21/2024 0446    INR    Component Value Date/Time   INR 1.2 02/21/2024 0446     Intake/Output Summary (Last 24 hours) at 02/21/2024 0721 Last data filed at 02/21/2024 0300 Gross per 24 hour  Intake 164.42 ml  Output --  Net 164.42 ml     Assessment/Plan:  38 y.o. male is s/p SEE ABOVE 1 Day Post-Op   Plan Patient is now postop day 1 from left lower extremity angiogram.  Patient endorses feeling much better today.  Patient endorses he has an appointment with vascular surgery at [facility] this afternoon at 2 PM.  He is to see a vascular surgeon about bypass surgery which was already in the works prior to him presenting to the emergency room yesterday.  He would like to make that appointment today.  Vascular surgery is okay with patient  discharge today to follow-up at [facility] for a scheduled appointment today at 2 PM with vascular surgery for possible bypass surgery.  Patient endorses feeling much better.  Patient will continue to follow-up with [facility] as they are his primary vascular surgery team.  We recommend the patient continue to be on aspirin  81 mg daily and Plavix  75 mg daily until follow-up with [facility].  DVT prophylaxis: None   Gwendlyn JONELLE Shank [facility] 02/21/2024 7:21 AM

## 2024-02-21 NOTE — Consult Note (Signed)
 PHARMACY - ANTICOAGULATION CONSULT NOTE  Pharmacy Consult for Heparin  Infusion Indication: limb ischemia  Allergies  Allergen Reactions   Baclofen Itching and Other (See Comments)    Became disoriented, emesis   Camellia Swelling   Iodinated Contrast Media Hives   Lisinopril Swelling   Other Swelling   Oxybenzone     Other reaction(s): Unknown    Patient Measurements: Height: 5' 9 (175.3 cm) Weight: 66.2 kg (145 lb 15.1 oz) IBW/kg (Calculated) : 70.7 HEPARIN  DW (KG): 66.2  Vital Signs: Temp: 97.5 F (36.4 C) (08/28 0450) Temp Source: Oral (08/28 0450) BP: 142/76 (08/28 0450) Pulse Rate: 76 (08/28 0450)  Labs: Recent Labs    02/20/24 0429 02/20/24 1426 02/21/24 0446  HGB 9.5*  --  9.0*  HCT 30.6*  --  27.9*  PLT 273  --  277  APTT 51*  --   --   LABPROT 14.2  --  15.8*  INR 1.0  --  1.2  HEPARINUNFRC  --  0.42 0.22*  CREATININE 8.90*  --   --     Estimated Creatinine Clearance: 10.3 mL/min (A) (by C-G formula based on SCr of 8.9 mg/dL (H)).   Medical History: Past Medical History:  Diagnosis Date   Anemia of chronic disease    CAD (coronary artery disease)    a. 11/2015 Cath: LM nl, LAD 30p, LCX nl, OM2 70-80, RCA 50-23m; b. 08/2020 MV: EF 56%. No isch/infarct; c. 10/2020 NSTEMI in setting of DKA-->Cath: LM nl, LAD nl, D2 70, LCX nl, OM2 70, OM3 70 inf branch, RCA 80p/m-->Med rx.   ESRD (end stage renal disease) (HCC)    Essential hypertension    History of echocardiogram    a. 10/2020 Echo: EF >55%, nl RV fxn.   Mixed hyperlipidemia    PVD (peripheral vascular disease) (HCC)    a. 07/2015 PTA/Stenting RSFA; b. 12/2015 LLE Fem->PTA bypass; c. 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 04/2020 Duplex: patent LLE bypass. Mild prox RSFA stenosis. Stable ABIs.   Stroke [facility])    Tobacco abuse    Type 1 diabetes (HCC)     Medications:  No history of chronic anticoagulation PTA  Assessment: 38yo male with history of coronary artery disease, hypertension,  hyperlipidemia, type 1 diabetes, end-stage renal disease on hemodialysis Monday/Wednesday/Friday, peripheral vascular disease status post left lower extremity bypass presents to ED with complaints of hyperglycemia and leg spasms that began with the last few days. Patient also reports he broke his leg 2 months ago. Patient follows with [facility] vascular. Due to concern for possible ischemic limb, pharmacy has been consulted to initiate and dose continuous heparin  infusion.  Baseline labs: aPTT 51 sec, INR 1.0, Plts 273, Hgb 9.5  8/27 1426 HL 0.42,  therapeutic x 1 8/27 1907 heparin  drip stopped for procedure, to be resumed at 2130 per vasc consult order 8/28 0605 HL 0.22, subtherapeutic  Goal of Therapy:  Heparin  level 0.3-0.7 units/ml Monitor platelets by anticoagulation protocol: Yes   Plan:  Bolus 1000 units x 1 Increase heparin  infusion to 1300 units/hr Check  anti-Xa level in 8 hours after rate change and daily while on heparin  Continue to monitor H&H and platelets  Raymond Lamb A Sha Amer, PharmD Clinical Pharmacist 02/21/2024 6:12 AM

## 2024-02-21 NOTE — Progress Notes (Signed)
 [facility]  ROUNDING NOTE   Subjective:   Raymond Lamb is a 38 y.o. male with a history of ESRD on hemodialysis with left upper extremity AV fistula, type 1 diabetes with prior history of DKA, peripheral vascular disease, prior stroke, and hypertension.  Patient reports to the emergency room complaining of hyperglycemia and left leg pain.  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  Last treatment completed on Monday.  Patient has been wheelchair-bound since having a fall that IN A TIB-FIB FRACTURE.  Patient states he has been having worsening pain over the past week.  Denies recent falls.Seen sitting up in bed, room air. No lower extremity edema.  Labs on ED arrival concerning for glucose 437, BUN 49, creatinine 8.90 with GFR 7, white count 11.7 and hemoglobin 9.8.  Hemoglobin A1c 9.4.  Left tib-fib imaging shows minimally displaced spiral fracture of the distal tibial diaphysis and minimally displaced oblique fracture of the proximal fibular diaphysis.  Ultrasound negative for DVT.  Vascular team was consulted and completed angiogram.   We have been consulted to manage dialysis needs.    Objective:  Vital signs in last 24 hours:  Temp:  [97.5 F (36.4 C)-98.6 F (37 C)] 97.7 F (36.5 C) (08/28 0907) Pulse Rate:  [0-98] 85 (08/28 0907) Resp:  [9-20] 17 (08/28 0907) BP: (126-195)/(70-110) 177/84 (08/28 0907) SpO2:  [93 %-100 %] 100 %  (08/28 0907) Weight:  [66.2 kg] 66.2 kg (08/27 1454)  Weight change: -0.025 kg Filed Weights   02/20/24 0528 02/20/24 1454  Weight: 66.2 kg 66.2 kg    Intake/Output: I/O last 3 completed shifts: In: 164.4 [I.V.:164.4] Out: -    Intake/Output this shift:  Total I/O In: 240 [P.O.:240] Out: -   Physical Exam: General: NAD  Head: Normocephalic, atraumatic. Moist oral mucosal membranes  Eyes: Anicteric  Lungs:  Clear to auscultation, room air  Heart: Regular rate and rhythm  Abdomen:  Soft, nontender  Extremities:  No peripheral edema.  Neurologic: Awake, alert, conversant  Skin: Warm,dry, no rash  Access: Lt AVF    Basic Metabolic Panel: Recent Labs  Lab 02/20/24 0429 02/21/24 0446  NA 137 134*  K 4.2 4.8  CL 93* 97*  CO2 27 21*  GLUCOSE 437* 351*  BUN 49* 75*  CREATININE 8.90* 10.41*  CALCIUM  9.3 9.1  MG 1.7  --     Liver Function Tests: Recent Labs  Lab 02/20/24 0429 02/21/24 0446  AST 15 10*  ALT 13 11  ALKPHOS 81 66  BILITOT 0.3 0.6  PROT 7.6 6.8  ALBUMIN 3.4* 2.9*   No results for input(s): LIPASE, AMYLASE in the last 168 hours. No results for input(s): AMMONIA in the last 168 hours.  CBC: Recent Labs  Lab 02/20/24 0429 02/21/24 0446  WBC 11.7* 11.6*  NEUTROABS 7.4  --   HGB 9.5* 9.0*  HCT 30.6* 27.9*  MCV 95.3 93.0  PLT 273 277    Cardiac Enzymes: No results for input(s): CKTOTAL, CKMB, CKMBINDEX, TROPONINI in the last 168 hours.  BNP: Invalid input(s):  POCBNP  CBG: Recent Labs  Lab 02/20/24 2017 02/20/24 2041 02/20/24 2137 02/21/24 0605 02/21/24 0826  GLUCAP 319* 291* 259* 376* 499*    Microbiology: Results for orders placed or performed during the hospital encounter of 11/13/21  MRSA Next Gen by PCR, Nasal     Status: None   Collection Time: 11/13/21  7:18 PM   Specimen: Nasal Mucosa; Nasal Swab  Result Value Ref Range Status   MRSA by PCR Next Gen NOT DETECTED NOT DETECTED Final    Comment:  (NOTE) The GeneXpert MRSA Assay (FDA approved for NASAL specimens only), is one component of a comprehensive MRSA colonization surveillance program. It is not intended to diagnose MRSA infection nor to guide or monitor treatment for MRSA infections. Test performance is not FDA approved in patients less than 8 years old. Performed at [facility], [address] [place], [place] [address]     Coagulation Studies: Recent Labs    02/20/24 0429 02/21/24 0446  LABPROT 14.2 15.8*  INR 1.0 1.2    Urinalysis: No results for input(s): COLORURINE, LABSPEC, PHURINE, GLUCOSEU, HGBUR, BILIRUBINUR, KETONESUR, PROTEINUR, UROBILINOGEN, NITRITE, LEUKOCYTESUR in the last 72 hours.  Invalid input(s): APPERANCEUR    Imaging: PERIPHERAL VASCULAR CATHETERIZATION Result Date: 02/20/2024 See surgical note for result.  US  Venous Img Lower Unilateral Left Result Date: 02/20/2024 EXAM: ULTRASOUND DUPLEX OF THE LEFT LOWER EXTREMITY VEINS TECHNIQUE: Duplex ultrasound using B-mode/gray scaled imaging and Doppler spectral analysis and color flow was obtained of the deep venous structures of the left lower extremity. COMPARISON: None. CLINICAL HISTORY: Calf pain. FINDINGS: The visualized veins of the lower extremity are patent and free of echogenic thrombus. The veins demonstrate good compressibility with normal color flow study and spectral analysis. IMPRESSION: 1. No evidence of DVT. Electronically signed by: Waddell Calk MD 02/20/2024 06:57 AM EDT RP Workstation: [id number]   DG Tibia/Fibula Left Result Date: 02/20/2024 EXAM: 3 VIEW(S) XRAY OF THE LEFT TIBIA AND FIBULA 02/20/2024 05:20:00 AM COMPARISON: None available. CLINICAL HISTORY: Pain, h/o fx. The patient presents with spasms in left tib fib, h/o fracture x 2 months ago. Patient unable to straighten leg. Difficulty finding pulse in leg per RN. FINDINGS: BONES AND JOINTS: Minimally displaced spiral fracture of distal  tibial diaphysis and minimally displaced oblique fracture of proximal fibular diaphysis. SOFT TISSUES: Diffuse subcutaneous edema. Vascular calcification and surgical clips noted. IMPRESSION: 1. Minimally displaced spiral fracture of distal tibial diaphysis and minimally displaced oblique fracture of proximal fibular diaphysis. 2. Diffuse subcutaneous edema. Electronically signed by: Waddell Calk MD 02/20/2024 05:24 AM EDT RP Workstation: [id number]     Medications:    sodium chloride      sodium chloride  10 mL/hr at 02/20/24 1509   heparin  1,300 Units/hr (02/21/24 0602)    Chlorhexidine  Gluconate Cloth  6 each Topical Q0600   ezetimibe   10 mg Oral Daily   hydrALAZINE   50 mg Oral TID   insulin  aspart  0-5 Units Subcutaneous QHS   insulin  aspart  0-6 Units Subcutaneous TID WC   insulin  aspart  5 Units Subcutaneous TID WC   insulin  glargine  5 Units Subcutaneous QHS   isosorbide  mononitrate  30 mg Oral Daily   metoprolol  succinate  100 mg Oral Daily   mirtazapine   30 mg Oral QHS   pantoprazole   40 mg Oral Daily   sertraline   100 mg Oral q AM   acetaminophen  **OR** acetaminophen , cyclobenzaprine , iodixanol , morphine  injection, ondansetron  **OR** ondansetron  (ZOFRAN ) IV, polyethylene glycol  Assessment/ Plan:  Mr.  Raymond Lamb is a 38 y.o.  male  with a history of ESRD on hemodialysis with left upper extremity AV fistula, type 1 diabetes with prior history of DKA, peripheral vascular disease, prior stroke, and hypertension.  End stage renal disease on hemodialysis. Last treatment completed on Monday. Was planning make up sesson today for missed Wednesday treatment however patient labs and clinical presentation stable. Patient agreeable to wait until scheduled treatment tomorrow. Primary considering discharge, patient cleared to discharge and resume outpatient treatments.   2. Ischemia, left lower extremity, Vascular surgery consulted and completed an angiogram on 8/27. Successful  recanalization to left lower femoris, uncertain if healing of fracture is possible based on limited circulation from remaining vascular damage. Will continue to follow up with dialysis.   3. .Anemia of chronic kidney disease Lab Results  Component Value Date   HGB 9.0 (L) 02/21/2024    Hgb borderline. Patient receives Mircera at outpatient clinic.   4. Secondary Hyperparathyroidism: with outpatient labs: PTH 458, phosphorus 7.6, calcium  8.8 on 02/13/24.   Lab Results  Component Value Date   CALCIUM  9.1 02/21/2024   PHOS 3.6 11/14/2021    Will continue to monitor bone minerals during this admission. Prescribed senispar and cholecalciferol outpatient.    LOS: 1 Jasyah Theurer 8/28/202511:45 AM

## 2024-02-25 ENCOUNTER — Emergency Department
Admission: EM | Admit: 2024-02-25 | Discharge: 2024-02-25 | Disposition: A | Discharge status: Home / Self Care | Attending: Emergency Medicine | Admitting: Emergency Medicine

## 2024-02-25 ENCOUNTER — Encounter: Payer: Self-pay | Admitting: Emergency Medicine

## 2024-02-25 ENCOUNTER — Emergency Department

## 2024-02-25 ENCOUNTER — Other Ambulatory Visit: Payer: Self-pay

## 2024-02-25 DIAGNOSIS — N186 End stage renal disease: Secondary | ICD-10-CM | POA: Diagnosis not present

## 2024-02-25 DIAGNOSIS — Z992 Dependence on renal dialysis: Secondary | ICD-10-CM | POA: Insufficient documentation

## 2024-02-25 DIAGNOSIS — I2510 Atherosclerotic heart disease of native coronary artery without angina pectoris: Secondary | ICD-10-CM | POA: Insufficient documentation

## 2024-02-25 DIAGNOSIS — I120 Hypertensive chronic kidney disease with stage 5 chronic kidney disease or end stage renal disease: Secondary | ICD-10-CM | POA: Diagnosis not present

## 2024-02-25 DIAGNOSIS — M79605 Pain in left leg: Secondary | ICD-10-CM | POA: Insufficient documentation

## 2024-02-25 DIAGNOSIS — E1022 Type 1 diabetes mellitus with diabetic chronic kidney disease: Secondary | ICD-10-CM | POA: Diagnosis not present

## 2024-02-25 LAB — CBC WITH DIFFERENTIAL/PLATELET
Abs Immature Granulocytes: 0.06 K/uL (ref 0.00–0.07)
Basophils Absolute: 0.1 K/uL (ref 0.0–0.1)
Basophils Relative: 1 %
Eosinophils Absolute: 0.9 K/uL — ABNORMAL HIGH (ref 0.0–0.5)
Eosinophils Relative: 8 %
HCT: 26.2 % — ABNORMAL LOW (ref 39.0–52.0)
Hemoglobin: 8.5 g/dL — ABNORMAL LOW (ref 13.0–17.0)
Immature Granulocytes: 1 %
Lymphocytes Relative: 19 %
Lymphs Abs: 2 K/uL (ref 0.7–4.0)
MCH: 30.6 pg (ref 26.0–34.0)
MCHC: 32.4 g/dL (ref 30.0–36.0)
MCV: 94.2 fL (ref 80.0–100.0)
Monocytes Absolute: 1 K/uL (ref 0.1–1.0)
Monocytes Relative: 9 %
Neutro Abs: 6.8 K/uL (ref 1.7–7.7)
Neutrophils Relative %: 62 %
Platelets: 267 K/uL (ref 150–400)
RBC: 2.78 MIL/uL — ABNORMAL LOW (ref 4.22–5.81)
RDW: 16.1 % — ABNORMAL HIGH (ref 11.5–15.5)
WBC: 10.7 K/uL — ABNORMAL HIGH (ref 4.0–10.5)
nRBC: 0 % (ref 0.0–0.2)

## 2024-02-25 LAB — PROTIME-INR
INR: 1.1 (ref 0.8–1.2)
Prothrombin Time: 14.8 s (ref 11.4–15.2)

## 2024-02-25 LAB — BASIC METABOLIC PANEL WITH GFR
Anion gap: 17 — ABNORMAL HIGH (ref 5–15)
BUN: 67 mg/dL — ABNORMAL HIGH (ref 6–20)
CO2: 24 mmol/L (ref 22–32)
Calcium: 8.7 mg/dL — ABNORMAL LOW (ref 8.9–10.3)
Chloride: 97 mmol/L — ABNORMAL LOW (ref 98–111)
Creatinine, Ser: 10.77 mg/dL — ABNORMAL HIGH (ref 0.61–1.24)
GFR, Estimated: 6 mL/min — ABNORMAL LOW (ref >60)
Glucose, Bld: 134 mg/dL — ABNORMAL HIGH (ref 70–99)
Potassium: 4 mmol/L (ref 3.5–5.1)
Sodium: 138 mmol/L (ref 135–145)

## 2024-02-25 MED ORDER — OXYCODONE HCL 5 MG PO TABS
5.0000 mg | ORAL_TABLET | Freq: Once | ORAL | Status: AC
  Administered 2024-02-25: 5 mg via ORAL
  Filled 2024-02-25: qty 1

## 2024-02-25 NOTE — Discharge Instructions (Addendum)
Please follow-up with your outpatient provider.  Please return for any new, worsening, or change in symptoms or other concerns.  It was a pleasure caring for you today. 

## 2024-02-25 NOTE — ED Notes (Signed)
 Pt's insulin  pump noted to be going off when in the room. Pt stated that he does not have the device to deactivate the alarm. He called his girlfriend who is at home to deactivate the alarm sound.

## 2024-02-25 NOTE — ED Provider Notes (Signed)
 [facility] Provider Note    Event Date/Time   First MD Initiated Contact with Patient 02/25/24 [phone]     (approximate)   History   Leg Pain   HPI  Raymond Lamb is a 38 y.o. male with a past medical history of end-stage renal disease, ischemic leg, CVA, CAD, type 1 diabetes who presents today for evaluation of left leg pain.  Patient reports that he broke his leg approximately 6 weeks ago and has been wearing a splint on his leg.  He has been using a scooter to get around.  He reports that today he fell off his scooter and landed on his left leg.  He denies head strike or LOC.  He reports that he has had spasms in his left leg for a long period of time.  This is what caused him to fall today.  Patient Active Problem List   Diagnosis Date Noted   Ischemic leg 02/20/2024   High anion gap metabolic acidosis 11/13/2021   Hyponatremia 11/13/2021   Chest pain 12/14/2020   DKA, type 1 (HCC) 12/14/2020   Hyperkalemia 12/14/2020   HLD (hyperlipidemia) 12/14/2020   HTN (hypertension) 12/14/2020   ESRD (end stage renal disease) on dialysis [facility])    Hypoglycemia 12/01/2020   CAD (coronary artery disease) 11/16/2020   Depression with anxiety 06/22/2016   History of CVA (cerebrovascular accident) 06/22/2016   ESRD (end stage renal disease) (HCC) 12/04/2015   PVD (peripheral vascular disease) (HCC) 12/04/2015   Anemia in ESRD (end-stage renal disease) (HCC) 03/22/2015   Type 1 diabetes mellitus with hypoglycemia without coma (HCC) 03/21/2015          Physical Exam   Triage Vital Signs: ED Triage Vitals  Encounter Vitals Group     BP      Girls Systolic BP Percentile      Girls Diastolic BP Percentile      Boys Systolic BP Percentile      Boys Diastolic BP Percentile      Pulse      Resp      Temp      Temp src      SpO2      Weight      Height      Head Circumference      Peak Flow      Pain Score      Pain Loc      Pain Education       Exclude from Growth Chart     Most recent vital signs: Vitals:   02/25/24 0750  BP: (!) 179/99  Pulse: 87  Resp: 18  Temp: 98.2 F (36.8 C)  SpO2: 96%    Physical Exam Vitals and nursing note reviewed.  Constitutional:      General: Awake and alert. No acute distress.    Appearance: Normal appearance. The patient is normal weight.  HENT:     Head: Normocephalic and atraumatic.     Mouth: Mucous membranes are moist.  Eyes:     General: PERRL. Normal EOMs        Right eye: No discharge.        Left eye: No discharge.     Conjunctiva/sclera: Conjunctivae normal.  Cardiovascular:     Rate and Rhythm: Normal rate and regular rhythm.     Pulses: Normal pulses.  Pulmonary:     Effort: Pulmonary effort is normal. No respiratory distress.     Breath sounds: Normal breath sounds.  Abdominal:     Abdomen is soft. There is no abdominal tenderness. No rebound or guarding. No distention. Musculoskeletal:        General: No swelling. Normal range of motion.     Cervical back: Normal range of motion and neck supple. Left leg in cam boot.  When cam boot removed, patient has chronic appearing wounds to his left lower extremity.  Foot is warm to touch, able to opposite.  No lymphangitis.  There is 1+ pitting edema to his lower leg. Skin:    General: Skin is warm and dry.     Capillary Refill: Capillary refill takes less than 2 seconds.     Findings: No rash.  Neurological:     Mental Status: The patient is awake and alert.      ED Results / Procedures / Treatments   Labs (all labs ordered are listed, but only abnormal results are displayed) Labs Reviewed  CBC WITH DIFFERENTIAL/PLATELET - Abnormal; Notable for the following components:      Result Value   WBC 10.7 (*)    RBC 2.78 (*)    Hemoglobin 8.5 (*)    HCT 26.2 (*)    RDW 16.1 (*)    Eosinophils Absolute 0.9 (*)    All other components within normal limits  BASIC METABOLIC PANEL WITH GFR - Abnormal; Notable for the  following components:   Chloride 97 (*)    Glucose, Bld 134 (*)    BUN 67 (*)    Creatinine, Ser 10.77 (*)    Calcium  8.7 (*)    GFR, Estimated 6 (*)    Anion gap 17 (*)    All other components within normal limits  PROTIME-INR     EKG     RADIOLOGY I independently reviewed and interpreted imaging and agree with radiologists findings.     PROCEDURES:  Critical Care performed:   Procedures   MEDICATIONS ORDERED IN ED: Medications  oxyCODONE  (Oxy IR/ROXICODONE ) immediate release tablet 5 mg (5 mg Oral Given 02/25/24 9176)     IMPRESSION / MDM / ASSESSMENT AND PLAN / ED COURSE  I reviewed the triage vital signs and the nursing notes.   Differential diagnosis includes, but is not limited to, DVT, ischemic limb, diabetes, tibia fracture.  Patient is awake and alert, hemodynamically stable and afebrile.  He has contractures of his left upper and lower extremity for his baseline.  I reviewed the patient's chart.  Patient was recently admitted from 8/27 until 8/28 for critical limb ischemia of the left lower extremity with ulceration of the lower leg.  He followed up with [facility].  He was seen on 02/20/2024 for hyperglycemia and spasms.  He does not ambulate at baseline and uses a wheelchair.  Further workup is indicated.  Ultrasound obtained given that he had a recent hospitalization and his leg is been in a cam boot, though this was negative for DVT.  X-ray redemonstrates his known tib-fib fracture.  Blood work is roughly stable from baseline and he is not acutely bleeding, and denies any black or bloody stool.  Patient was treated symptomatically with good effect.  I recommended outpatient follow-up with his outpatient providers.  There is not appear to be any acute changes today and he is stable for outpatient follow-up.  We discussed strict return precautions in the meantime.  Patient understands and agrees with plan.  He was discharged in stable condition.  Case  was discussed with Dr. Arlander who agrees with assessment and  plan.  Patient's presentation is most consistent with severe exacerbation of chronic illness.   FINAL CLINICAL IMPRESSION(S) / ED DIAGNOSES   Final diagnoses:  Left leg pain     Rx / DC Orders   ED Discharge Orders     None        Note:  This document was prepared using Dragon voice recognition software and may include unintentional dictation errors.   Maybell Misenheimer E, PA-C 02/25/24 1447    Arlander Charleston, MD 02/25/24 1448    Arlander [place], MD 02/25/24 [phone]

## 2024-02-25 NOTE — ED Triage Notes (Signed)
 Presents s/p fall from chair yesterday  Having pain to left leg  States broke his leg and is in Cam boot  Describes pain as spasms'

## 2024-02-28 NOTE — H&P (View-Only) (Signed)
 Reason for Clinic Visit: Follow up PAD, ESRD  YEP:Raymond Lamb is a 38 y.o. male with history of LUE DVT, T1DM, HTN, anoxic brain injury 05/2015, and NSTEMI, ESRD on dialysis MWF, who returns to clinic after recent admission and discharge from OSH for leg cramps. Patient underwent LLE angiogram with profundoplasty and his pain has significantly improved. Patient presents today for evaluation after having duplex completed. He has a history of chronic wounds on LLE that have no expressed purulence or concern for infectious etiology at this time. He denies fever or chills. Patient continues to have mild contracture of his left knee due to pain after breaking his leg around 3-4 weeks ago. He has continued to work on this at home. Patient states he has decreased his smoking significantly and is currently smoking tobacco once a week with marijuana around 3-4 times per week. He continues to take aspirin , statin, and plavix  daily.    Interventions to date: - Angioplasty and stenting of RSFA for BLE CLI for rest pain with Dr. 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 Vavalle 11/18/21 - LLE diagnostic arteriogram 01/29/2024 - LLE profundoplasty (OSH) 02/20/2024  Past Medical History:  Diagnosis Date  . Cardiac arrest    (CMS-HCC) 05/2015  . Cardiomyopathy    (CMS-HCC)   . Cardiovascular disease   . Congenital heart disease (HHS-HCC)   . DKA (diabetic ketoacidoses) 04/20/2016  . DVT (deep venous thrombosis)    (CMS-HCC)   . ESRD (end stage renal disease) on dialysis    (CMS-HCC)    T Th Sat  . GERD (gastroesophageal reflux disease) 08/03/2021  . Hemiplegia and hemiparesis following cerebral infarction affecting left non-dominant side    (CMS-HCC) 08/08/2022  . HIV disease    (CMS-HCC)   . Hypertension   .  Jaundice   . Myocardial infarction    (CMS-HCC)   . Peripheral arterial occlusive disease   . Pyogenic inflammation of bone    (CMS-HCC) 02/17/2016  . Sleep apnea, obstructive   . Stroke    (CMS-HCC) 05/2015   left sided weakness, gets around with a wheelchair since stroke  . Type 1 diabetes    (CMS-HCC)    Dignosed age 17. 2 episodes of DKA in past  . Zoster ophthalmicus 04/13/2020   P.E. Vitals:   02/28/24 1447  BP: 171/92  Pulse: 102     38 y.o. in NAD Psych : A,A Ox 3 Neuro: Grossly Intact Neck: Supple, no evidence of JVD Chest : no increased work of breathing on room air  Heart: RRR, No M/G/R Abdomen: soft, NT,ND BS+ Upper extremities : warm and well perfused. Lower Extremities : warm, well perfused. Multiple well circumscribed, excoriated lesions to LLE and toes. LLE with mild contracture due to pain on examination. AT, PT signal BLE, popliteal signal.    Assessment: 38 y.o. male returns to clinic for follow-up of PAD. LLE angio with profundaplasty on 02/20/24 at OSH with improvement in symptoms. Patient had repeat doppler studies completed today on examination with plan to complete a bypass based on the studies with in the next two weeks.  Doppler LLE:  Arterial wall calcification precludes accurate ankle pressures and ABIs. Absent PPG tracing of the left great toe. No flow noted in the proximal PTA, reconstitution in the mid calf with possible  short segment occlusion in the distal calf. No flow noted in the right ATA. Reconstitution of the DP via collateralization.  Plan: Plan to schedule patient for a femoral to peroneal bypass. Consent signed. Consent obtained at bedside with patient Will obtain pre anesthesia clearance  Plan to stop Plavix  7 days prior to surgery  Damien Bjork, MD  Time Spent: 40 minutes spent face-to-face counseling and coordination of care and greater than 50% of the visit was spent face-to-face.  I saw and evaluated the patient,  participating in the key portions of the service.  I reviewed the resident's note.  I agree with the resident's findings and plan.   Lawayne Patience, MD Professor of Surgery Division of Vascular Surgery Pager : [phone]

## 2024-03-17 ENCOUNTER — Ambulatory Visit
Admission: EM | Admit: 2024-03-17 | Discharge: 2024-03-17 | Disposition: A | Discharge status: Home / Self Care | Attending: Emergency Medicine | Admitting: Emergency Medicine

## 2024-03-17 DIAGNOSIS — R0981 Nasal congestion: Secondary | ICD-10-CM | POA: Diagnosis present

## 2024-03-17 DIAGNOSIS — J029 Acute pharyngitis, unspecified: Secondary | ICD-10-CM | POA: Diagnosis present

## 2024-03-17 DIAGNOSIS — J069 Acute upper respiratory infection, unspecified: Secondary | ICD-10-CM | POA: Insufficient documentation

## 2024-03-17 LAB — GROUP A STREP BY PCR: Group A Strep by PCR: NOT DETECTED

## 2024-03-17 LAB — SARS CORONAVIRUS 2 BY RT PCR: SARS Coronavirus 2 by RT PCR: NEGATIVE

## 2024-03-17 MED ORDER — IPRATROPIUM BROMIDE 0.06 % NA SOLN: 2.0000 | Freq: Four times a day (QID) | NASAL | 0 refills | Status: AC

## 2024-03-17 MED ORDER — LIDOCAINE VISCOUS HCL 2 % MT SOLN: 15.0000 mL | OROMUCOSAL | 0 refills | Status: DC | PRN

## 2024-03-17 NOTE — ED Triage Notes (Signed)
 Patient states that sx started this morning  Sore throat Nasal congestion Bilateral ear pain

## 2024-03-17 NOTE — Discharge Instructions (Signed)
-   Testing for COVID and strep.  I will call you tomorrow with any positive results. - I sent a nasal spray and viscous lidocaine  for your throat. - May take Tylenol  as needed for discomfort. - If you have strep we will send antibiotics. - If you have COVID you need to isolate until fever free and feeling better or 5 days from symptom onset and wear mask x 5 days.

## 2024-03-18 ENCOUNTER — Ambulatory Visit: Payer: Self-pay | Admitting: Physician Assistant

## 2024-03-18 NOTE — ED Provider Notes (Signed)
 [facility]    CSN: [id number] Arrival date & time: 03/17/24  1849      History   Chief Complaint Chief Complaint  Patient presents with   Otalgia   Cough   Sore Throat    HPI Stefan Markarian is a 38 y.o. male with history of CAD, ESRD, type 1 diabetes, CVA, HTN, HLD, PVD.  Patient presents today for onset of sore throat, nasal congestion and bilateral ear pressure this morning.  Denies fever, increased fatigue from baseline, cough, chest pain, shortness of breath, abdominal pain, nausea/vomiting or diarrhea.  Unsure of any sick contacts.  Has not taken any OTC meds.  No other complaints or concerns.  HPI  Past Medical History:  Diagnosis Date   Anemia of chronic disease    CAD (coronary artery disease)    a. 11/2015 Cath: LM nl, LAD 30p, LCX nl, OM2 70-80, RCA 50-64m; b. 08/2020 MV: EF 56%. No isch/infarct; c. 10/2020 NSTEMI in setting of DKA-->Cath: LM nl, LAD nl, D2 70, LCX nl, OM2 70, OM3 70 inf branch, RCA 80p/m-->Med rx.   ESRD (end stage renal disease) (HCC)    Essential hypertension    History of echocardiogram    a. 10/2020 Echo: EF >55%, nl RV fxn.   Mixed hyperlipidemia    PVD (peripheral vascular disease)    a. 07/2015 PTA/Stenting RSFA; b. 12/2015 LLE Fem->PTA bypass; c. 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 04/2020 Duplex: patent LLE bypass. Mild prox RSFA stenosis. Stable ABIs.   Stroke [facility])    Tobacco abuse    Type 1 diabetes [facility])     Patient Active Problem List   Diagnosis Date Noted   Ischemic leg 02/20/2024   High anion gap metabolic acidosis 11/13/2021   Hyponatremia 11/13/2021   Chest pain 12/14/2020   DKA, type 1 (HCC) 12/14/2020   Hyperkalemia 12/14/2020   HLD (hyperlipidemia) 12/14/2020   HTN (hypertension) 12/14/2020   ESRD (end stage renal disease) on dialysis [facility])    Hypoglycemia 12/01/2020   CAD (coronary artery disease) 11/16/2020   Depression with anxiety 06/22/2016   History of CVA (cerebrovascular accident)  06/22/2016   ESRD (end stage renal disease) (HCC) 12/04/2015   PVD (peripheral vascular disease) 12/04/2015   Anemia in ESRD (end-stage renal disease) (HCC) 03/22/2015   Type 1 diabetes mellitus with hypoglycemia without coma (HCC) 03/21/2015    Past Surgical History:  Procedure Laterality Date   AVF Left    LOWER EXTREMITY ANGIOGRAPHY Left 02/20/2024   Procedure: Lower Extremity Angiography;  Surgeon: Jama Cordella MATSU, MD;  Location: [facility] INVASIVE CV LAB;  Service: Cardiovascular;  Laterality: Left;       Home Medications    Prior to Admission medications   Medication Sig Start Date End Date Taking? Authorizing Provider  aspirin  81 MG chewable tablet Chew 81 mg by mouth daily. 11/25/20  Yes [provider]  atorvastatin  (LIPITOR ) 80 MG tablet Take 80 mg by mouth every evening. 09/29/20  Yes [provider]  AURYXIA  1 GM 210 MG(Fe) tablet Take 420 mg by mouth 3 (three) times daily with meals. 10/12/22  Yes [provider]  Cholecalciferol 50 MCG (2000 UT) TABS Take 1 tablet by mouth daily. 05/09/23  Yes [provider]  cinacalcet  (SENSIPAR ) 90 MG tablet Take 90 mg by mouth every morning. 05/16/23  Yes [provider]  clopidogrel  (PLAVIX ) 75 MG tablet Take 75 mg by mouth daily. 08/30/20  Yes [provider]  cyclobenzaprine  (FLEXERIL ) 10  MG tablet Take 10 mg by mouth as needed for muscle spasms. 02/05/24  Yes [provider]  gabapentin  (NEURONTIN ) 300 MG capsule Take 600 mg by mouth at bedtime. 09/28/20  Yes [provider]  insulin  lispro (HUMALOG) 100 UNIT/ML injection Inject 50 Units into the skin daily. Uses in Pump 01/18/24  Yes [provider]  ipratropium (ATROVENT ) 0.06 % nasal spray Place 2 sprays into both nostrils 4 (four) times daily. 03/17/24  Yes Arvis Jolan NOVAK, PA-C  isosorbide  mononitrate (IMDUR ) 30 MG 24 hr tablet Take 30 mg by mouth daily. 10/14/21  Yes [provider]  lidocaine   (XYLOCAINE ) 2 % solution Use as directed 15 mLs in the mouth or throat every 3 (three) hours as needed for mouth pain (swish and spit). 03/17/24  Yes Arvis Jolan NOVAK, PA-C  metoprolol  succinate (TOPROL -XL) 100 MG 24 hr tablet Take 100 mg by mouth daily. 10/14/21  Yes [provider]  mirtazapine  (REMERON ) 30 MG tablet Take 30 mg by mouth at bedtime. 07/10/23  Yes [provider]  pantoprazole  (PROTONIX ) 40 MG tablet Take 40 mg by mouth daily. 08/28/23  Yes [provider]  sertraline  (ZOLOFT ) 100 MG tablet Take 100 mg by mouth in the morning. 11/25/20  Yes [provider]  tiZANidine (ZANAFLEX) 2 MG tablet Take 2 mg by mouth 2 (two) times daily as needed for muscle spasms. 08/22/23  Yes [provider]  triamcinolone ointment (KENALOG) 0.1 % Apply 1 Application topically 2 (two) times daily. 11/07/23  Yes [provider]  VELPHORO 500 MG chewable tablet Chew 500 mg by mouth 3 (three) times daily with meals. 12/21/23  Yes [provider]  VELTASSA  16.8 g PACK Take 1 packet by mouth daily. 11/18/23  Yes [provider]  ezetimibe  (ZETIA ) 10 MG tablet Take 10 mg by mouth daily. 02/10/21 02/10/22  [provider]    Family History Family History  Problem Relation Age of Onset   Diabetes type I Other    Clotting disorder Neg Hx     Social History Social History   Tobacco Use   Smoking status: Every Day    Current packs/day: 0.30    Average packs/day: 0.3 packs/day for 20.0 years (6.0 ttl pk-yrs)    Types: Cigarettes   Smokeless tobacco: Never   Tobacco comments:    Currently smoking about six cigarettes/day.  Vaping Use   Vaping status: Never Used  Substance Use Topics   Alcohol use: Never   Drug use: Yes    Types: Marijuana    Comment: rarely     Allergies   Baclofen, Camellia, Iodinated contrast media, Lisinopril, Other, and Oxybenzone   Review of Systems Review of Systems  Constitutional:  Negative for  fatigue and fever.  HENT:  Positive for congestion, ear pain, rhinorrhea and sore throat. Negative for sinus pressure and sinus pain.   Respiratory:  Negative for cough and shortness of breath.   Gastrointestinal:  Negative for abdominal pain, diarrhea, nausea and vomiting.  Musculoskeletal:  Negative for myalgias.  Neurological:  Negative for weakness, light-headedness and headaches.  Hematological:  Negative for adenopathy.     Physical Exam Triage Vital Signs ED Triage Vitals  Encounter Vitals Group     BP 03/17/24 1946 (!) 159/92     Girls Systolic BP Percentile --      Girls Diastolic BP Percentile --      Boys Systolic BP Percentile --      Boys Diastolic BP Percentile --  Pulse Rate 03/17/24 1946 94     Resp 03/17/24 1946 18     Temp 03/17/24 1946 98.2 F (36.8 C)     Temp Source 03/17/24 1946 Oral     SpO2 03/17/24 1946 98 %     Weight 03/17/24 1945 145 lb (65.8 kg)     Height --      Head Circumference --      Peak Flow --      Pain Score 03/17/24 1945 10     Pain Loc --      Pain Education --      Exclude from Growth Chart --    No data found.  Updated Vital Signs BP (!) 159/92 (BP Location: Right Arm)   Pulse 94   Temp 98.2 F (36.8 C) (Oral)   Resp 18   Wt 145 lb (65.8 kg)   SpO2 98%   BMI 21.41 kg/m    Physical Exam Vitals and nursing note reviewed.  Constitutional:      General: He is not in acute distress.    Appearance: Normal appearance. He is well-developed. He is not ill-appearing.  HENT:     Head: Normocephalic and atraumatic.     Right Ear: Tympanic membrane, ear canal and external ear normal.     Left Ear: Tympanic membrane, ear canal and external ear normal.     Nose: Congestion present.     Mouth/Throat:     Mouth: Mucous membranes are moist.     Pharynx: Oropharynx is clear. Posterior oropharyngeal erythema present.  Eyes:     General: No scleral icterus.    Conjunctiva/sclera: Conjunctivae normal.  Cardiovascular:     Rate  and Rhythm: Normal rate and regular rhythm.  Pulmonary:     Effort: Pulmonary effort is normal. No respiratory distress.     Breath sounds: Normal breath sounds.  Musculoskeletal:     Cervical back: Neck supple.  Skin:    General: Skin is warm and dry.     Capillary Refill: Capillary refill takes less than 2 seconds.  Neurological:     General: No focal deficit present.     Mental Status: He is alert. Mental status is at baseline.     Motor: No weakness.     Gait: Gait abnormal (in wheelchair with orthotic/boot on left leg).  Psychiatric:        Mood and Affect: Mood normal.      UC Treatments / Results  Labs (all labs ordered are listed, but only abnormal results are displayed) Labs Reviewed  GROUP A STREP BY PCR  SARS CORONAVIRUS 2 BY RT PCR    EKG   Radiology No results found.  Procedures Procedures (including critical care time)  Medications Ordered in UC Medications - No data to display  Initial Impression / Assessment and Plan / UC Course  I have reviewed the triage vital signs and the nursing notes.  Pertinent labs & imaging results that were available during my care of the patient were reviewed by me and considered in my medical decision making (see chart for details).   38 year old male presents for nasal congestion, sore throat and bilateral ear pressure that began today.  No fever, cough, shortness of breath.  He is overall well-appearing.  No acute distress.  On exam has nasal congestion and erythema posterior pharynx mildly.  Chest clear.  Heart regular rate and rhythm.  COVID and strep testing obtained.  Will contact patient if positive results.  Advised  symptoms consistent with virus if strep is negative.  Reviewed current CDC guidelines, isolation protocol and ED precautions if positive COVID.  Sent viscous lidocaine  and Atrovent  nasal spray.  Reviewed typical course of most viral illnesses.  Reviewed return precautions.  Negative strep and COVID  testing.  No change to treatment plan.  Result communicated to patient through MyChart.   Final Clinical Impressions(s) / UC Diagnoses   Final diagnoses:  Viral upper respiratory tract infection  Sore throat  Nasal congestion     Discharge Instructions      - Testing for COVID and strep.  I will call you tomorrow with any positive results. - I sent a nasal spray and viscous lidocaine  for your throat. - May take Tylenol  as needed for discomfort. - If you have strep we will send antibiotics. - If you have COVID you need to isolate until fever free and feeling better or 5 days from symptom onset and wear mask x 5 days.    ED Prescriptions     Medication Sig Dispense Auth. Provider   ipratropium (ATROVENT ) 0.06 % nasal spray Place 2 sprays into both nostrils 4 (four) times daily. 15 mL Arvis Huxley B, PA-C   lidocaine  (XYLOCAINE ) 2 % solution Use as directed 15 mLs in the mouth or throat every 3 (three) hours as needed for mouth pain (swish and spit). 100 mL Arvis Huxley NOVAK, PA-C      PDMP not reviewed this encounter.   Arvis Huxley NOVAK, PA-C 03/18/24 [phone]

## 2024-03-26 NOTE — Interval H&P Note (Signed)
-------------------------------------------------------------------------------   Attestation signed by Ave Bosworth, MD at 03/26/24 1319 I saw and evaluated the patient, participating in the key portions of the service.  I reviewed the resident's note.  I agree with the resident's findings and plan.   Bosworth Ave, MD Professor of Surgery Division of Vascular Surgery Pager : [phone]  -------------------------------------------------------------------------------  DAY OF SURGERY UPDATE  H&P reviewed. The patient was examined and there are no changes to the H&P.  Pt is having worsening contracture. Has some mild rest pain. Wounds are stable. Last dose of plavix  was a week ago.   Due to the diagnosis of CLTI, left femoral to tibial bypass with humacyte graft was offered to the patient. I discussed the risks, benefits, and alternatives to this procedure with the patient. The material risks, as discussed, include but are not limited to MI, stroke, death, bleeding, infection, leg ischemia, nerve injury. I also discussed that there was a high likelihood that we would not find a suitable target to sew the bypass to. Alternative procedures including non-operative management as well as endovascular treatment were discussed. The patient's questions were answered to their satisfaction and they demonstrated a general understanding of the above. The patient participated in shared decision-making. Informed, written consent was obtained and documented in the patient's chart.  Lexi Betancourt, MD Vascular Surgery PGY5

## 2024-04-04 NOTE — Progress Notes (Signed)
 ASSESSMENT/PLAN:   Pt with left leg pain. Concern for vascular etiology. Pt sent to the ED for further evaluation and management.    Diagnosis ICD-10-CM Associated Orders  1. Pain in left lower leg  M79.662      Requested Prescriptions    No prescriptions requested or ordered in this encounter   Instructions about new medications and side effects provided.  If any changes in chronic medications then new reconciled medication list is given to patient.   CHIEF COMPLAINT:   Chief Complaint  Patient presents with  . left leg pain    Hx PAD, diabetes, awaiting schedule of left AKA    SUBJECTIVE/HPI:  [name] presents with left leg pain for the past few days. Pt recently had bypass surgery in the left leg. Pt rates pain 8/10.  Past Medical History[1] Allergies[2] Social History[3] Family History[4]  ROS:  Review of Systems  Musculoskeletal:        Left leg pain   See Subjective/HPI Medications, Allergies and Problem List personally reviewed in Epic today  OBJECTIVE:       04/04/24 1055  BP: 164/86  Pulse: 89  Resp: 17  Temp: 37.2 C (99 F)  SpO2: 95%  Weight: 70.3 kg (155 lb)  PainSc: 8   PainLoc: Leg   Physical Exam Vitals reviewed.  Constitutional:      General: He is not in acute distress.    Appearance: Normal appearance.  HENT:     Head: Normocephalic.     Right Ear: External ear normal.     Left Ear: External ear normal.     Nose: Nose normal.     Mouth/Throat:     Pharynx: Oropharynx is clear.  Eyes:     Conjunctiva/sclera: Conjunctivae normal.  Cardiovascular:     Rate and Rhythm: Normal rate.  Pulmonary:     Effort: Pulmonary effort is normal. No respiratory distress.  Skin:    General: Skin is warm and dry.  Neurological:     Mental Status: He is alert.      No results found.   LABS/X-RAYS/EKG/MEDS:    No results found for this visit on 04/04/24.  No results found.   I provided an intervention for the Tobacco Use SDOH  domain. The intervention was Counseling and Education   Transferred to ED Patient Disposition  Indication for Disposition: Requires a higher level of care  Chief Complaint: left leg pain   Darren Caldron given instructions to proceed to ED via personal vehicle; Location: [place] due to Other   Report called to: charge nurse      A copy of these instructions have been given to the patient or responsible adult who demonstrated the ability to learn, asked appropriate questions, and verbalized understanding of the plan of care.  There were no barriers to learning identified.  Please take the time to sign up for a MyChart Account. See sign up information in your After Visit Summary. You will be able to view lab results, make appointments, communicate with providers, and much more.       [1] Past Medical History: Diagnosis Date  . Atrial fibrillation    (CMS-HCC) 12/13/2020   ECG 06-16-2015 noted Atrial Fib with RVR noted during ED to Adm after PEA arrest.  . Cardiac arrest (CMS-HCC) 05/2015  . Cardiomyopathy (CMS-HCC)   . DKA (diabetic ketoacidoses) 04/20/2016  . DVT (deep venous thrombosis) (CMS-HCC)   . ESRD (end stage renal disease) on dialysis    (  CMS-HCC)    T Th Sat  . GERD (gastroesophageal reflux disease) 08/03/2021  . Hemiplegia and hemiparesis following cerebral infarction affecting left non-dominant side    (CMS-HCC) 08/08/2022  . Hypertension   . Myocardial infarction (CMS-HCC)   . Peripheral arterial occlusive disease   . 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 Stroke    (CMS-HCC) 05/2015   left sided weakness, gets around with a wheelchair since stroke  . Type 1 diabetes (CMS-HCC)    Dignosed age 53. 2 episodes of DKA in past  . Zoster ophthalmicus 04/13/2020  [2] Allergies Allergen Reactions  . Baclofen Other (See Comments)    Became disoriented, emesis  . Camellia Oleifera Seed Oil Swelling  . Iodinated Contrast Media Hives   . Lisinopril Swelling  . Oxybenzone(Benzophenone 3)     Other reaction(s): Unknown  [3] Social History Socioeconomic History  . Marital status: Single    Spouse name: None  . Number of children: 2  . Years of education: 59  . Highest education level: 11th grade  Tobacco Use  . Smoking status: Some Days    Current packs/day: 0.20    Types: Cigars, Cigarettes    Start date: 2019    Passive exposure: Past  . Smokeless tobacco: Never  . Tobacco comments:    Smoking two back and milds a week  Vaping Use  . Vaping status: Former  Substance and Sexual Activity  . Alcohol use: No    Alcohol/week: 0.0 standard drinks of alcohol    Comment: socially, very seldom  . Drug use: Yes    Types: Marijuana  . Sexual activity: Yes    Partners: Female   Social Drivers of Corporate investment banker Strain: Low Risk  (01/30/2024)   Overall Financial Resource Strain (CARDIA)   . Difficulty of Paying Living Expenses: Not very hard  Food Insecurity: No Food Insecurity (02/20/2024)   Received from [facility]   Hunger Vital Sign   . Within the past 12 months, you worried that your food would run out before you got the money to buy more.: Never true   . Within the past 12 months, the food you bought just didn't last and you didn't have money to get more.: Never true  Transportation Needs: No Transportation Needs (02/20/2024)   Received from [facility] - Transportation   . In the past 12 months, has lack of transportation kept you from medical appointments or from getting medications?: No   . In the past 12 months, has lack of transportation kept you from meetings, work, or from getting things needed for daily living?: No  Social Connections: Socially Isolated (08/20/2019)   Social Connection and Isolation Panel   . Frequency of Communication with Friends and Family: More than three times a week   . Frequency of Social Gatherings with Friends and Family: More than three times a week    . Attends Religious Services: Never   . Active Member of Clubs or Organizations: No   . Attends Banker Meetings: Never   . Marital Status: Never married  Housing: Low Risk  (01/30/2024)   Housing   . Within the past 12 months, have you ever stayed: outside, in a car, in a tent, in an overnight shelter, or temporarily in someone else's home (i.e. couch-surfing)?: No   . Are you worried about losing your housing?: No  [4] Family History Problem Relation Age of Onset  . Cancer Paternal  Uncle        brain  . Diabetes Maternal Grandmother   . Hypertension Maternal Grandmother   . Diabetes Paternal Grandmother   . Hypertension Paternal Grandmother   . Heart disease Paternal Grandfather   . COPD Paternal Grandfather        Lung cancer  . Kidney disease Other        multiple family members  . Hypertension Other        multiple family members  . Diabetes type I Other        on both sides of family  . Lung cancer Other        grandmother  . Clotting disorder Neg Hx   . Anesthesia problems Neg Hx   . Glaucoma Neg Hx   . Macular degeneration Neg Hx   . Bleeding Disorder Neg Hx

## 2024-04-10 NOTE — Progress Notes (Signed)
 [facility] [place] [facility] Care Management Progress Note  Date: 04/10/2024 Outcome:  1st outreach attempt CM will make further attempts at outreach.   Purpose of contact:         Attempted to contact patient for a CCM check in. Unable to reach, unable to leave a voicemail.   Additional Information/Plan: Patient provided my direct contact information and encouraged to contact me should additional needs arise.  Time Spent Per Day: Chart review was completed prior to outreach attempt.  04/10/2024: 3  HERMENIA CIRA Barman Fellow [employer] Health Care Manager [facility] [place]

## 2024-04-14 NOTE — Progress Notes (Signed)
 [facility] [place] [facility] Care Management Progress Note  Date: 04/14/2024 Outcome:  2nd outreach attempt, left 2nd voicemail. CM will send MyChart message (if active) with my direct contact information.   Purpose of contact:         Attempted to contact patient. Unable to reach, unable to leave a voicemail. Sent a [employer] geologist.  Planned to discuss:  - Scheduling retinal eye exam, [phone]  - Making a follow up appointment with PCP  Additional Information/Plan: Patient provided my direct contact information and encouraged to contact me should additional needs arise.  Time Spent Per Day: Chart review was completed prior to outreach attempt.  04/14/2024: 5  Raymond Lamb Barman Fellow [employer] Health Care Manager [facility] [place]

## 2024-04-18 ENCOUNTER — Emergency Department
Admission: EM | Admit: 2024-04-18 | Discharge: 2024-04-18 | Disposition: A | Discharge status: Home / Self Care | Attending: Emergency Medicine | Admitting: Emergency Medicine

## 2024-04-18 DIAGNOSIS — Z8673 Personal history of transient ischemic attack (TIA), and cerebral infarction without residual deficits: Secondary | ICD-10-CM | POA: Insufficient documentation

## 2024-04-18 DIAGNOSIS — E1022 Type 1 diabetes mellitus with diabetic chronic kidney disease: Secondary | ICD-10-CM | POA: Diagnosis not present

## 2024-04-18 DIAGNOSIS — N186 End stage renal disease: Secondary | ICD-10-CM | POA: Diagnosis not present

## 2024-04-18 DIAGNOSIS — I2510 Atherosclerotic heart disease of native coronary artery without angina pectoris: Secondary | ICD-10-CM | POA: Insufficient documentation

## 2024-04-18 DIAGNOSIS — L98493 Non-pressure chronic ulcer of skin of other sites with necrosis of muscle: Secondary | ICD-10-CM | POA: Diagnosis not present

## 2024-04-18 DIAGNOSIS — Z794 Long term (current) use of insulin: Secondary | ICD-10-CM | POA: Diagnosis not present

## 2024-04-18 DIAGNOSIS — I96 Gangrene, not elsewhere classified: Secondary | ICD-10-CM | POA: Insufficient documentation

## 2024-04-18 DIAGNOSIS — I998 Other disorder of circulatory system: Secondary | ICD-10-CM | POA: Diagnosis not present

## 2024-04-18 DIAGNOSIS — Z720 Tobacco use: Secondary | ICD-10-CM | POA: Diagnosis not present

## 2024-04-18 DIAGNOSIS — I120 Hypertensive chronic kidney disease with stage 5 chronic kidney disease or end stage renal disease: Secondary | ICD-10-CM | POA: Diagnosis not present

## 2024-04-18 DIAGNOSIS — Z992 Dependence on renal dialysis: Secondary | ICD-10-CM | POA: Diagnosis not present

## 2024-04-18 DIAGNOSIS — Z7982 Long term (current) use of aspirin: Secondary | ICD-10-CM | POA: Insufficient documentation

## 2024-04-18 DIAGNOSIS — M79605 Pain in left leg: Secondary | ICD-10-CM | POA: Diagnosis present

## 2024-04-18 MED ORDER — DOCUSATE SODIUM 100 MG PO CAPS: 100.0000 mg | ORAL_CAPSULE | Freq: Two times a day (BID) | ORAL | 0 refills | Status: AC

## 2024-04-18 MED ORDER — ONDANSETRON 4 MG PO TBDP
4.0000 mg | ORAL_TABLET | Freq: Once | ORAL | Status: AC
  Administered 2024-04-18: 4 mg via ORAL
  Filled 2024-04-18: qty 1

## 2024-04-18 MED ORDER — METHOCARBAMOL 500 MG PO TABS: 500.0000 mg | ORAL_TABLET | Freq: Three times a day (TID) | ORAL | 0 refills | Status: DC | PRN

## 2024-04-18 MED ORDER — OXYCODONE-ACETAMINOPHEN 5-325 MG PO TABS
2.0000 | ORAL_TABLET | Freq: Once | ORAL | Status: AC
  Administered 2024-04-18: 2 via ORAL
  Filled 2024-04-18: qty 2

## 2024-04-18 MED ORDER — ONDANSETRON 4 MG PO TBDP: 4.0000 mg | ORAL_TABLET | Freq: Four times a day (QID) | ORAL | 0 refills | Status: AC | PRN

## 2024-04-18 MED ORDER — HYDROMORPHONE HCL 1 MG/ML IJ SOLN
1.0000 mg | Freq: Once | INTRAMUSCULAR | Status: AC
Start: 2024-04-18 — End: 2024-04-18
  Administered 2024-04-18: 1 mg via INTRAMUSCULAR
  Filled 2024-04-18: qty 1

## 2024-04-18 MED ORDER — OXYCODONE-ACETAMINOPHEN 5-325 MG PO TABS: 2.0000 | ORAL_TABLET | Freq: Three times a day (TID) | ORAL | 0 refills | Status: DC | PRN

## 2024-04-18 MED ORDER — OXYCODONE-ACETAMINOPHEN 5-325 MG PO TABS
2.0000 | ORAL_TABLET | Freq: Once | ORAL | Status: DC
Start: 2024-04-18 — End: 2024-04-18

## 2024-04-18 NOTE — ED Triage Notes (Signed)
 Pt arrived to ED d/t c/o left leg pain -- scheduled to have it amputated -- pt broke left tibia this past July. Pt is coming in today d/t the uncontrollable pain and spasms he is having in his left leg.

## 2024-04-18 NOTE — ED Notes (Signed)
 Non adherent dressing applied to pts right foot. Pt tolerated well.

## 2024-04-18 NOTE — Unmapped External Note (Signed)
-------------------------------------------------------------------------------   Attestation signed by Ave Bosworth, MD at 04/18/24 [phone] I, Dr. Ave, was present throughout the entire procedure.  -------------------------------------------------------------------------------  Vascular surgery operative note:   Date of Surgery: 03/26/2024  Preoperative Diagnosis:  CLTI  Postoperative Diagnosis:  Same  Procedures Performed:  Open left peroneal artery exploration  Primary Surgeon:  Bosworth Ave, M.D.  Vascular fellow:   Fredric Hope, MD  Assistant:   None  Anesthesia:   General  Estimated Blood Loss:   10 mL  Blood Replacement:  None  Intraoperative Findings:  Severely diminiutive and calcified peroneal artery, no suitable bypass target so case was aborted.   Complications:   None  Specimen Sent:   None  Prosthetic Implantations:  None  Indications:  38 yo M with hx of several prior lower extremity bypasses which have occluded and he has new development of left lower extremity foot wounds and rest pain. LLE angiogram was performed with no suitable endovascular intervention possible so decision was made to take him to the OR for open exploration for possible femoral to distal tibial bypass.   Description of the Procedure:  The patient was identified in the holding area and after informed consent was obtained they were taken back into the OR.  The patient was placed on the table in supine position.  The patient was given general anesthesia and was intubated.  The left lower extremity was then prepped and draped circumferentially, the prep was then extended across midline and up to the ribs in sterile fashion.  Timeout was then performed identifying the patient's name, medical record number, and the procedure to be performed.  The administration of IV antibiotics was confirmed  An incision was made on the medial left calf approximately a  finger breadth posterior to the tibia. The incision was carried down through soft tissue and fascia with bovie cautery. The soleus was detached from the tibia and the posterior artery was identified. The peroneal artery was then identified in the deep posterior compartment via doppler ultrasound. The peroneal artery was noted to be very diminutive and there was a weak signal with heavy calcifications. At this point, we decided the artery was not a suitable bypass target so we aborted the case.   Hemostasis was assured in the wound. All counts were noted to be correct.  The deep tissues were then approximated with 3-0 Vicryl suture in interrupted fashion in both proximal and distal wound beds.  The tissue was approximated in multiple layers.  The skin was then approximated using a 4-0 Vicryl suture in running fashion and Dermabond.  Patient was then allowed to wake from anesthesia and found to be in stable condition and was taken back to the postanesthesia care unit in stable condition.  Dr. Ave was present and scrubbed for the critical portions of the case.   Lexi Betancourt, MD Vascular Surgery PGY5

## 2024-04-18 NOTE — Discharge Instructions (Addendum)

## 2024-04-18 NOTE — ED Provider Notes (Signed)
 [facility] Provider Note    Event Date/Time   First MD Initiated Contact with Patient 04/18/24 [phone]     (approximate)   History   Leg Pain   HPI  Raymond Lamb is a 38 y.o. male with history of CAD, hypertension, hyperlipidemia, type 1 diabetes, peripheral arterial disease, tobacco use, end-stage renal disease on hemodialysis with a left upper extremity AV fistula who presents to the emergency department with uncontrolled chronic pain of the left leg.  Has known left critical leg ischemia that has failed vascular intervention and there is a plan to have an AKA of this leg soon.  He is followed by vascular surgery at [facility].  He also has a known left tibia fracture that is nonhealing due to his chronic ischemia and diabetes.  He is currently in a walking boot.  He reports he did have 10 oxycodone  tablets at home for pain control but these are now gone.  He states he has nothing else at home to control his pain and symptoms have been severe.  No new injury.  He does have gangrene and ulcerated lesions to the foot with some odor but no purulent drainage, fever or chills.   History provided by patient.    Past Medical History:  Diagnosis Date   Anemia of chronic disease    CAD (coronary artery disease)    a. 11/2015 Cath: LM nl, LAD 30p, LCX nl, OM2 70-80, RCA 50-68m; b. 08/2020 MV: EF 56%. No isch/infarct; c. 10/2020 NSTEMI in setting of DKA-->Cath: LM nl, LAD nl, D2 70, LCX nl, OM2 70, OM3 70 inf branch, RCA 80p/m-->Med rx.   ESRD (end stage renal disease) (HCC)    Essential hypertension    History of echocardiogram    a. 10/2020 Echo: EF >55%, nl RV fxn.   Mixed hyperlipidemia    PVD (peripheral vascular disease)    a. 07/2015 PTA/Stenting RSFA; b. 12/2015 LLE Fem->PTA bypass; c. 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 04/2020 Duplex: patent LLE bypass. Mild prox RSFA stenosis. Stable ABIs.   Stroke [facility])    Tobacco abuse    Type 1 diabetes [facility])      Past Surgical History:  Procedure Laterality Date   AVF Left    LOWER EXTREMITY ANGIOGRAPHY Left 02/20/2024   Procedure: Lower Extremity Angiography;  Surgeon: Jama Cordella MATSU, MD;  Location: [facility] INVASIVE CV LAB;  Service: Cardiovascular;  Laterality: Left;    MEDICATIONS:  Prior to Admission medications   Medication Sig Start Date End Date Taking? Authorizing Provider  aspirin  81 MG chewable tablet Chew 81 mg by mouth daily. 11/25/20   [provider]  atorvastatin  (LIPITOR ) 80 MG tablet Take 80 mg by mouth every evening. 09/29/20   [provider]  AURYXIA  1 GM 210 MG(Fe) tablet Take 420 mg by mouth 3 (three) times daily with meals. 10/12/22   [provider]  Cholecalciferol 50 MCG (2000 UT) TABS Take 1 tablet by mouth daily. 05/09/23   [provider]  cinacalcet  (SENSIPAR ) 90 MG tablet Take 90 mg by mouth every morning. 05/16/23   [provider]  clopidogrel  (PLAVIX ) 75 MG tablet Take 75 mg by mouth daily. 08/30/20   [provider]  cyclobenzaprine  (FLEXERIL ) 10 MG tablet Take 10 mg by mouth as needed for muscle spasms. 02/05/24   [provider]  ezetimibe  (ZETIA ) 10 MG tablet Take 10 mg by mouth daily. 02/10/21 02/10/22  [provider]  gabapentin  (NEURONTIN )  300 MG capsule Take 600 mg by mouth at bedtime. 09/28/20   [provider]  insulin  lispro (HUMALOG) 100 UNIT/ML injection Inject 50 Units into the skin daily. Uses in Pump 01/18/24   [provider]  ipratropium (ATROVENT ) 0.06 % nasal spray Place 2 sprays into both nostrils 4 (four) times daily. 03/17/24   Arvis Jolan NOVAK, PA-C  isosorbide  mononitrate (IMDUR ) 30 MG 24 hr tablet Take 30 mg by mouth daily. 10/14/21   [provider]  lidocaine  (XYLOCAINE ) 2 % solution Use as directed 15 mLs in the mouth or throat every 3 (three) hours as needed for mouth pain (swish and spit). 03/17/24   Arvis Jolan NOVAK, PA-C  metoprolol  succinate  (TOPROL -XL) 100 MG 24 hr tablet Take 100 mg by mouth daily. 10/14/21   [provider]  mirtazapine  (REMERON ) 30 MG tablet Take 30 mg by mouth at bedtime. 07/10/23   [provider]  pantoprazole  (PROTONIX ) 40 MG tablet Take 40 mg by mouth daily. 08/28/23   [provider]  sertraline  (ZOLOFT ) 100 MG tablet Take 100 mg by mouth in the morning. 11/25/20   [provider]  tiZANidine (ZANAFLEX) 2 MG tablet Take 2 mg by mouth 2 (two) times daily as needed for muscle spasms. 08/22/23   [provider]  triamcinolone ointment (KENALOG) 0.1 % Apply 1 Application topically 2 (two) times daily. 11/07/23   [provider]  VELPHORO 500 MG chewable tablet Chew 500 mg by mouth 3 (three) times daily with meals. 12/21/23   [provider]  VELTASSA  16.8 g PACK Take 1 packet by mouth daily. 11/18/23   [provider]    Physical Exam   Triage Vital Signs: ED Triage Vitals  Encounter Vitals Group     BP 04/18/24 0208 (!) 153/101     Girls Systolic BP Percentile --      Girls Diastolic BP Percentile --      Boys Systolic BP Percentile --      Boys Diastolic BP Percentile --      Pulse Rate 04/18/24 0208 97     Resp 04/18/24 0208 18     Temp 04/18/24 0208 99 F (37.2 C)     Temp Source 04/18/24 0208 Oral     SpO2 04/18/24 0208 100 %     Weight 04/18/24 0210 155 lb (70.3 kg)     Height 04/18/24 0210 5' 9 (1.753 m)     Head Circumference --      Peak Flow --      Pain Score 04/18/24 0210 10     Pain Loc --      Pain Education --      Exclude from Growth Chart --     Most recent vital signs: Vitals:   04/18/24 0208  BP: (!) 153/101  Pulse: 97  Resp: 18  Temp: 99 F (37.2 C)  SpO2: 100%    CONSTITUTIONAL: Alert, responds appropriately to questions.  Chronically ill-appearing, thin HEAD: Normocephalic, atraumatic EYES: Conjunctivae clear, pupils appear equal, sclera nonicteric ENT: normal nose; moist mucous membranes NECK:  Supple, normal ROM CARD: RRR; S1 and S2 appreciated RESP: Normal chest excursion without splinting or tachypnea; breath sounds clear and equal bilaterally; no wheezes, no rhonchi, no rales, no hypoxia or respiratory distress, speaking full sentences ABD/GI: Non-distended EXT: Left lower extremity has gangrenous wounds to the left great toe, partially of the left second toe.  Large ulcerated lesions involving the skin, soft tissue, muscle noted  to the dorsal foot without surrounding redness, warmth, bleeding or drainage.  No calf tenderness or calf swelling.  No joint effusions.  Atrophied muscle of the left leg.  Left leg is cool to touch.  Unable to palpate DP pulse. SKIN: Normal color for age and race; warm; no rash on exposed skin NEURO: Moves all extremities equally, normal speech PSYCH: The patient's mood and manner are appropriate.   ED Results / Procedures / Treatments   LABS: (all labs ordered are listed, but only abnormal results are displayed) Labs Reviewed - No data to display   EKG:  RADIOLOGY: My personal review and interpretation of imaging:    I have personally reviewed all radiology reports.   No results found.   PROCEDURES:  Critical Care performed: No    Procedures    IMPRESSION / MDM / ASSESSMENT AND PLAN / ED COURSE  I reviewed the triage vital signs and the nursing notes.    Patient here for acute on chronic pain of his left lower extremity from critical limb ischemia, gangrene, ulcers.  Also has nonhealing fracture of the tibia.  The patient is on the cardiac monitor to evaluate for evidence of arrhythmia and/or significant heart rate changes.   DIFFERENTIAL DIAGNOSIS (includes but not limited to):   Critical limb ischemia, dry gangrene, ischemic ulcers, nonhealing fracture, no signs of cellulitis, septic arthritis, gout, compartment syndrome   Patient's presentation is most consistent with acute complicated illness / injury requiring diagnostic  workup.   PLAN: Patient here requesting pain control.  Has known critical limb ischemia with dry gangrene and ischemic ulcers.  Has a nonhealing spiral tibial fracture.  No new injury.  No signs of superimposed infection.  Compartments are soft.  Is been followed closely by vascular surgery and is awaiting scheduling for AKA.  He understands that his limb is not salvageable.  He has failed previous revascularization.  Patient continues to smoke.  His diabetes is poorly controlled.  Discussed with patient that gangrene, ulcers will be normal and will progressively worsen due to his ischemia.  Recommended tobacco cessation, control of his diabetes.  Will provide with pain medication, muscle relaxants for muscle spasms.  No indication for further emergent workup especially given the limb is not salvageable and he is not having systemic symptoms.  He has follow-up already scheduled with his vascular surgeon at [facility] next week.   MEDICATIONS GIVEN IN ED: Medications  oxyCODONE -acetaminophen  (PERCOCET/ROXICET) 5-325 MG per tablet 2 tablet (has no administration in time range)  HYDROmorphone  (DILAUDID ) injection 1 mg (1 mg Intramuscular Given 04/18/24 0506)  ondansetron  (ZOFRAN -ODT) disintegrating tablet 4 mg (4 mg Oral Given 04/18/24 0506)     ED COURSE:  At this time, I do not feel there is any life-threatening condition present. I reviewed all nursing notes, vitals, pertinent previous records.  All lab and urine results, EKGs, imaging ordered have been independently reviewed and interpreted by myself.  I reviewed all available radiology reports from any imaging ordered this visit.  Based on my assessment, I feel the patient is safe to be discharged home without further emergent workup and can continue workup as an outpatient as needed. Discussed all findings, treatment plan as well as usual and customary return precautions.  They verbalize understanding and are comfortable with this plan.  Outpatient  follow-up has been provided as needed.  All questions have been answered.    CONSULTS:  none   OUTSIDE RECORDS REVIEWED: Reviewed recent vascular surgery notes.  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   Bertine Schlottman, Josette SAILOR, DO 04/18/24 [phone]

## 2024-04-27 NOTE — ED Provider Notes (Signed)
 [facility] Emergency Department Provider Note    ED Clinical Impression    Final diagnoses:  Gangrene    (CMS-HCC) (Primary)      Impression, Medical Decision Making, Progress Notes and Critical Care    Impression, Differential Diagnosis and Plan of Care  Raymond Lamb is a 38 y.o. male with a past medical history of PAD, LUE DVT, T1DM, HTN, anoxic brain injury 05/2015, and NSTEMI, ESRD (on HD M/W/F) who presents for evaluation of drainage and maggots from his chronic wounds on his left lower extremity.  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  He denies any fevers, chills, nausea, or vomiting but is tachycardic on arrival here to the emergency department and is noted to have a leukocytosis with a white count of 15.1.  Given this, he has been treated with IV fluids and antibiotics.  Fortunately, his lactate is within normal limits.  Given the progression of his wounds including now infestation with maggots, I believe he requires admission for further antibiotics while awaiting cultures.  He would benefit from wound debridement but also is scheduled for amputation.  Will defer that plan to the admitting team.  10:48 PM  Vascular surgery paged for potential admission to their service primarily.  11:00 PM Patient care signed out to Dr. Dann.  Case discussed with vascular surgery who will admit directly to their service.  Independent Interpretation of Studies  I have independently interpreted the following studies: EKG: Normal sinus rhythm, rate 97, normal axis, normal intervals, no ST elevation, ST depression, or T wave version.  Impression: Normal sinus rhythm. X-ray(s): Nondisplaced fracture of the right distal tibia/fibula. No subcutaneous gas.    Discussion of Management with other Providers or Support Staff  I discussed the  management of this patient with the: Admitting provider: Vascular surgery fellow  Considerations Regarding Disposition/Escalation of Care and Critical Care  Indications for observation/admission (or consideration of observation/admission) and/or appropriateness for outpatient management: Infected wound. Patient/Family/Caregiver Discussions: Discussed findings on exam and plan of care. Diagnostic Tests Considered But Not Done: CT lower extremity.  Portions of this record have been created using Scientist, clinical (histocompatibility and immunogenetics). Dictation errors have been sought, but may not have been identified and corrected.  See chart and nursing documentation for additional details.  ____________________________________________      History    Reason for Visit Foot Pain  HPI  Raymond Lamb is a 38 y.o. male with a past medical history of peripheral arterial disease, LUE DVT, T1DM, ESRD (on HD M/W/F), HTN, anoxic brain injury 05/2015, and NSTEMI who presents with worsening foot wound. The patient reports worsening of his chronic left foot wounds associated with a fishy odor and maggots noted today. His mom states that she noticed the changes today while she was dressing his wounds. He is closely followed by vascular surgery, and he is scheduled to undergo an AKA on 05/07/24 as he is not a candidate for revascularization. He endorses purulence from the wounds, but he states this is not new. Denies fevers, chills, nausea, or emesis.   Outside Historian(s) Mother at the bedside.  External Records Reviewed Vascular surgery note from 04/24/24 to review history.  Past Medical History[1]  Problem List[2]  Past Surgical History[3]  No current facility-administered medications for this encounter.  Current Outpatient Medications:  .  alcohol swabs PadM, Apply 1 each topically Four (4)  times a day., Disp: 200 each, Rfl: 3 .  aspirin  (ECOTRIN) 81 MG tablet, TAKE ONE TABLET BY MOUTH ONCE DAILY, Disp:  30 tablet, Rfl: 11 .  atorvastatin  (LIPITOR ) 80 MG tablet, Take 1 tablet (80 mg total) by mouth daily., Disp: 90 tablet, Rfl: 3 .  blood-glucose meter,continuous (DEXCOM G6 RECEIVER) Misc, Dispense 1 Dexcom G6 receiver, Disp: 1 each, Rfl: 0 .  blood-glucose meter,continuous (DEXCOM G7 RECEIVER) Misc, Use as instructed with G7 sensors, Disp: 1 each, Rfl: 1 .  blood-glucose sensor (DEXCOM G6 SENSOR) Devi, Dispense 1 Dexcom G6 sensor every 10 days, Disp: 9 each, Rfl: 3 .  blood-glucose transmitter (DEXCOM G6 TRANSMITTER) Devi, Dispense 1 Dexcom G6 transmitter every 90 days, Disp: 1 each, Rfl: 3 .  cholecalciferol, vitamin D3-50 mcg, 2,000 unit,, 50 mcg (2,000 unit) tablet, Take 1 tablet (50 mcg total) by mouth daily., Disp: , Rfl:  .  cinacalcet  (SENSIPAR ) 90 MG tablet, Take 1 tablet (90 mg total) by mouth daily., Disp: 90 tablet, Rfl: prn .  clopidogrel  (PLAVIX ) 75 mg tablet, Take 1 tablet (75 mg total) by mouth daily., Disp: 90 tablet, Rfl: 3 .  diclofenac sodium (VOLTAREN) 1 % gel, Apply 4 g topically four (4) times a day as needed for arthritis (knee pain)., Disp: 100 g, Rfl: 0 .  ezetimibe  (ZETIA ) 10 mg tablet, Take 1 tablet (10 mg total) by mouth daily., Disp: 30 tablet, Rfl: 11 .  ferric citrate  (AURYXIA ) 210 mg iron  Tab tablet, Take two tablets by mouth with meals, three times a day. Swallow whole.  Do not chew or crush medication., Disp: 550 tablet, Rfl: 11 .  gabapentin  (NEURONTIN ) 300 MG capsule, Take 2 capsules (600 mg total) by mouth nightly., Disp: 180 capsule, Rfl: 3 .  glucagon spray 3 mg/actuation Spry, Use 1 spray in 1 nostril for severe hypoglycemia, as per package instructions, Disp: 1 each, Rfl: 4 .  HUMALOG KWIKPEN INSULIN  100 unit/mL injection pen, Inject 10 Units under the skin Three (3) times a day before meals., Disp: 15 mL, Rfl: 2 .  insulin  lispro (HUMALOG) 100 unit/mL injection, Vials to be used in the pump to allow up to 50 units per day., Disp: 20 mL, Rfl: 11 .  insulin   pmp cart,aut,G6/7,cntr (OMNIPOD 5 G6-G7 INTRO KT,GEN5,) Crtg, INTRO KIT CONTAIN CONTROLLER AND PODS, TO ALLOW POD CHANGE EVERY 2 DAYS, Disp: 1 each, Rfl: 0 .  insulin  pump cart,auto,BT,G6/7 (OMNIPOD 5 G6-G7 PODS, GEN 5,) Crtg, Change pod every 2 days. Pod must be compatible with Dexcom G 6 and G 7., Disp: 15 each, Rfl: 11 .  insulin  syringe-needle U-100 0.5 mL 31 gauge x 5/16 (8 mm) Syrg, Use with insulin  Vials, 4 times per day before each meal or as needed to correct sugar., Disp: 120 each, Rfl: 11 .  isosorbide  mononitrate (IMDUR ) 30 MG 24 hr tablet, Take 1 tablet (30 mg total) by mouth daily., Disp: 90 tablet, Rfl: 3 .  lancets Misc, use to test blood sugar Four (4) times a day with a meal and nightly., Disp: 200 each, Rfl: 0 .  lancets Misc, Use to check blood sugar, up to 5 times a day, Disp: 102 each, Rfl: 11 .  methocarbamol  (ROBAXIN ) 500 MG tablet, Take 2 tablets (1,000 mg total) by mouth two (2) times a day., Disp: 60 tablet, Rfl: 0 .  metoPROLOL  succinate (TOPROL  XL) 100 MG 24 hr tablet, Take 1 tablet (100 mg total) by mouth daily., Disp: 90 tablet, Rfl: 3 .  mirtazapine  (REMERON ) 30 MG tablet, Take 1 tablet (30 mg total) by mouth nightly., Disp: 90 tablet, Rfl: 3 .  mupirocin (BACTROBAN) 2 % ointment, Apply topically two (2) times a day. Apply on open areas on the legs until they heal in, Disp: 30 g, Rfl: 5 .  naloxone (NARCAN) 4 mg nasal spray, 1 spray (4 mg total) into alternating nostrils once as needed for up to 1 dose. One spray in either nostril once for known/suspected opioid overdose. May repeat every 2-3 minutes in alternating nostril til EMS arrives, Disp: 1 each, Rfl: 2 .  OMNIPOD 5 PACK PODS, Use 1 Pod every 3 days, Disp: 30 each, Rfl: 3 .  oxyCODONE  (ROXICODONE ) 5 MG immediate release tablet, Take 1 tablet (5 mg total) by mouth every four (4) hours as needed for pain., Disp: 40 tablet, Rfl: 0 .  pantoprazole  (PROTONIX ) 40 MG tablet, Take 1 tablet (40 mg total) by mouth daily  before breakfast., Disp: 100 tablet, Rfl: 3 .  patiromer  calcium  sorbitex (VELTASSA ) 8.4 gram powder packet, Take 1 packet daily on non-dialysis days only, Disp: 30 packet, Rfl: 0 .  pen needle, diabetic (UNIFINE PENTIPS) 32 gauge x 5/32 (4 mm) Ndle, Use to give insulin  5 times a day, Disp: 200 each, Rfl: 11 .  predniSONE (DELTASONE) 50 MG tablet, Take 1 tablet (50 mg total) by mouth Take as directed. Give 13 hours, 7 hours and 1 hour before contrast procedure., Disp: 3 tablet, Rfl: 0 .  sertraline  (ZOLOFT ) 100 MG tablet, Take 1.5 tablets (150 mg total) by mouth daily., Disp: 135 tablet, Rfl: 3 .  sucralfate (CARAFATE) 1 gram tablet, Take 1 tablet (1 g total) by mouth four (4) times a day as needed (reflux)., Disp: 60 tablet, Rfl: 2 .  tizanidine (ZANAFLEX) 2 MG tablet, Take 1 tablet (2 mg total) by mouth every six (6) hours as needed., Disp: 30 tablet, Rfl: 3 .  tizanidine (ZANAFLEX) 2 MG tablet, TAKE 2 TABLETS BY MOUTH TWICE DAILY AS NEEDED FOR MUSCLE SPASM, Disp: 30 tablet, Rfl: 11 .  triamcinolone (KENALOG) 0.1 % ointment, Apply topically two (2) times a day. Put on the scabby areas on the legs, Disp: 80 g, Rfl: 3  Allergies Baclofen, Camellia oleifera seed oil, Iodinated contrast media, Lisinopril, and Oxybenzone(benzophenone 3)  Family History[4]  Social History Short Social History[5]    Physical Exam   ED Triage Vitals [04/27/24 2022]  Enc Vitals Group     BP 150/97     Pulse 102     SpO2 Pulse      Resp 18     Temp 37.3 C (99.1 F)     Temp src      SpO2 100 %     Weight      Height    Constitutional: Alert and oriented.  Chronically ill appearing but in no distress. Eyes: Conjunctivae are normal. ENT      Head: Normocephalic and atraumatic.      Nose: No congestion.      Mouth/Throat: Mucous membranes are moist.      Neck: No stridor. Cardiovascular: Normal rate, regular rhythm.  Respiratory: Normal respiratory effort. Breath sounds are normal. Gastrointestinal:  Soft and nontender. There is no CVA tenderness. Genitourinary: Deferred. Musculoskeletal: Swelling noted throughout the left ankle. Neurologic: Normal speech and language.  Contractures of the left upper extremity consistent with prior injury. Skin: Skin is warm.  There are significant wounds noted on the anterior and medial aspect of the  left foot.  Mild surrounding warmth.  No obvious erythema.  There are maggots noted over the medial wound but no active purulent drainage. Psychiatric: Mood and affect are normal. Speech and behavior are normal.          Radiology   XR Femur 2 Views Left  Final Result    XR Foot 2 Views Left  Final Result  1.  Acute or subacute supra-syndesmotic (Weber C) fracture of the distal third of the fibular diaphysis, new since 03/25/2024. This may be an open fracture given the new wound lateral to the fracture.  2.  Progressive displacement (including shortening and angulation) of the spiral fracture of the distal third of the tibial diaphysis (which acutely presented on 01/21/2024), as above.  3.  No ankle joint malalignment.  4.  No acute fracture or malalignment of the left foot.  5.  Atrophic soft tissues about the second toe can be seen with gangrene.  6.  Multiple chronic wounds of the left leg and foot.  7.  Osteopenia.  8.  Peripheral arterial calcifications.    XR Ankle 2 Views Left  Final Result  1.  Acute or subacute supra-syndesmotic (Weber C) fracture of the distal third of the fibular diaphysis, new since 03/25/2024. This may be an open fracture given the new wound lateral to the fracture.  2.  Progressive displacement (including shortening and angulation) of the spiral fracture of the distal third of the tibial diaphysis (which acutely presented on 01/21/2024), as above.  3.  No ankle joint malalignment.  4.  No acute fracture or malalignment of the left foot.  5.  Atrophic soft tissues about the second toe can be seen with gangrene.  6.   Multiple chronic wounds of the left leg and foot.  7.  Osteopenia.  8.  Peripheral arterial calcifications.    XR Tibia Fibula Left  Final Result  1.  Acute or subacute supra-syndesmotic (Weber C) fracture of the distal third of the fibular diaphysis, new since 03/25/2024. This may be an open fracture given the new wound lateral to the fracture.  2.  Progressive displacement (including shortening and angulation) of the spiral fracture of the distal third of the tibial diaphysis (which acutely presented on 01/21/2024), as above.  3.  No ankle joint malalignment.  4.  No acute fracture or malalignment of the left foot.  5.  Atrophic soft tissues about the second toe can be seen with gangrene.  6.  Multiple chronic wounds of the left leg and foot.  7.  Osteopenia.  8.  Peripheral arterial calcifications.      Procedures   Critical Care  Performed by: Claudene Morene Dawn, MD Authorized by: Claudene Morene Dawn, MD   Critical care provider statement:    Critical care time (minutes):  30   Critical care time was exclusive of:  Separately billable procedures and treating other patients and teaching time   Critical care was necessary to treat or prevent imminent or life-threatening deterioration of the following conditions:  Sepsis   Critical care was time spent personally by me on the following activities:  Ordering and performing treatments and interventions, ordering and review of laboratory studies, ordering and review of radiographic studies, pulse oximetry, re-evaluation of patient's condition, obtaining history from patient or surrogate, examination of patient, evaluation of patient's response to treatment, development of treatment plan with patient or surrogate, discussions with consultants and review of old charts  Documentation assistance was provided by Lamarr Edison,  Scribe, on April 27, 2024 at 8:54 PM for Morene Sharps, MD.  A scribe was used when documenting this visit. I  agree with the above documentation. Signed by  Morene DELENA Sharps, MD on  April 27, 2024 at 10:48 PM.       [1] Past Medical History: Diagnosis Date  . Atrial fibrillation    (CMS-HCC) 12/13/2020   ECG 06-16-2015 noted Atrial Fib with RVR noted during ED to Adm after PEA arrest.  . Cardiac arrest (CMS-HCC) 05/2015  . Cardiomyopathy (CMS-HCC)   . DKA (diabetic ketoacidoses) 04/20/2016  . DVT (deep venous thrombosis) (CMS-HCC)   . ESRD (end stage renal disease) on dialysis    (CMS-HCC)    T Th Sat  . GERD (gastroesophageal reflux disease) 08/03/2021  . Hemiplegia and hemiparesis following cerebral infarction affecting left non-dominant side    (CMS-HCC) 08/08/2022  . Hypertension   . Myocardial infarction (CMS-HCC)   . Peripheral arterial occlusive disease   . 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 Stroke    (CMS-HCC) 05/2015   left sided weakness, gets around with a wheelchair since stroke  . Type 1 diabetes (CMS-HCC)    Dignosed age 13. 2 episodes of DKA in past  . Zoster ophthalmicus 04/13/2020  [2] Patient Active Problem List Diagnosis  . Type 1 diabetes mellitus with chronic kidney disease on chronic dialysis (CMS-HCC)  . Hypertension  . Anemia in chronic kidney disease (CKD)  . Vitamin D deficiency  . ESRD (end stage renal disease) (CMS-HCC)  . 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 PVD (peripheral vascular disease)  . History of chronic DV of left upper extremity  . History of osteomyelitis  . History of CVA (cerebrovascular accident)  . Depression  . Claudication  . Dependence on renal dialysis  . Secondary hyperparathyroidism of renal origin (HHS-HCC)  . Preseptal cellulitis of right eye  . Hyponatremia  . Hyperkalemia  . Metabolic acidosis  . History of myocardial infarction  . HLD (hyperlipidemia)  . GERD (gastroesophageal reflux disease)  . Post herpetic neuralgia  . Pain in both knees  . Hemiplegia and hemiparesis following  cerebral infarction affecting left non-dominant side    (CMS-HCC)  . Skin lesion of lower extremity  . Major depressive disorder, single episode, severe without psychotic features    (CMS-HCC)  . Derangement of left knee  . Ischemic leg  . Other fracture of lower end of left tibia, initial encounter for closed fracture  . Other disorders of phosphorus metabolism  . Nausea  [3] Past Surgical History: Procedure Laterality Date  . AV FISTULA REPAIR  01/06/2019   Ballon angioplasty of stenotic basillic vein  . NASAL FRACTURE SURGERY     as teenager  . PR AMPUTATION FOOT,TRANSMETATARSAL Left 03/23/2016   Procedure: AMPUTATION, FOOT; TRANSMETATARSAL;  Surgeon: Selinda Bernardino Baston, MD;  Location: MAIN OR [facility];  Service: Vascular  . PR AMPUTATION METATARSAL+TOE,SINGLE Left 01/14/2016   Procedure: AMPUTATION, METATARSAL, WITH TOE SINGLE. L 4th toe;  Surgeon: Lawayne Patience, MD;  Location: MAIN OR [facility];  Service: Vascular  . PR AV ANAST,FOREARM VEIN TRANSPOSITION Left 05/08/2016   Procedure: Left Radial Cephalic fisutal vs Brachial Cephalic Fistula;  Surgeon: Lawayne Patience, MD;  [facility];  Service: Vascular  . PR CATH PLACE/CORON ANGIO, IMG SUPER/INTERP,W LEFT HEART VENTRICULOGRAPHY N/A 12/21/2015   Procedure: Left Heart Catheterization;  Surgeon: Suella Finders, MD;  Location: [facility] CATH;  Service: Cardiology  . PR CATH PLACE/CORON ANGIO, IMG  SUPER/INTERP,W LEFT HEART VENTRICULOGRAPHY N/A 11/18/2020   Procedure: Left Heart Catheterization;  Surgeon: Norleen Deward Grumbling, MD;  Location: [facility] CATH;  Service: Cardiology  . PR DEBRIDEMENT BONE 1ST 20 SQ CM/< Left 03/23/2016   Procedure: DEBRIDEMENT; SKIN, SUBCUTANEOUS TISSUE, MUSCLE, & BONE FOOT;  Surgeon: Selinda Bernardino Baston, MD;  Location: MAIN OR [facility];  Service: Vascular  . PR EXPLORATION N/FLWD SURG LOWER EXTREMITY ARTERY Left 03/26/2024   Procedure: EXPLORATION NOT FOLLOWED BY SURGICAL REPAIR, ARTERY; LOWER EXTREMITY (EG, COMMON FEMORAL, DEEP  FEMORAL, SUPERFICIAL FEMORAL, POPLITEAL, TIBIAL, PERONEAL);  Surgeon: Ave Bosworth, MD;  Location: OR [facility];  Service: Vascular  . PR VEIN BYPASS GRAFT,FEM-TIBIAL Left 01/03/2016   Procedure: Fem-Peroneal Bypass with Ipsilateral GSV;  Surgeon: Bosworth Ave, MD;  Location: MAIN OR [facility];  Service: Vascular  . RSFA stent    [4] Family History Problem Relation Age of Onset  . Cancer Paternal Uncle        brain  . Diabetes Maternal Grandmother   . Hypertension Maternal Grandmother   . Diabetes Paternal Grandmother   . Hypertension Paternal Grandmother   . Heart disease Paternal Grandfather   . COPD Paternal Grandfather        Lung cancer  . Kidney disease Other        multiple family members  . Hypertension Other        multiple family members  . Diabetes type I Other        on both sides of family  . Lung cancer Other        grandmother  . Clotting disorder Neg Hx   . Anesthesia problems Neg Hx   . Glaucoma Neg Hx   . Macular degeneration Neg Hx   . Bleeding Disorder Neg Hx   [5] Social History Tobacco Use  . Smoking status: Some Days    Current packs/day: 0.20    Types: Cigars, Cigarettes    Start date: 2019    Passive exposure: Past  . Smokeless tobacco: Never  . Tobacco comments:    Smoking 2 1/2 black and mild cigars a day  Vaping Use  . Vaping status: Former  Substance Use Topics  . Alcohol use: No    Alcohol/week: 0.0 standard drinks of alcohol    Comment: socially, very seldom  . Drug use: Yes    Types: Marijuana   Claudene Morene Dawn, MD 04/29/24 1227

## 2024-04-28 NOTE — Care Plan (Signed)
 HD tx 3hr 45 min.  Pt tolerating tx with bp and crit line, UF goal increased to 3L.  AVF with good flows.  Pt off tx 20 min early due to pain in leg.  removed

## 2024-04-28 NOTE — Consults (Signed)
 Endocrine Team Diabetes New Consult Note   Consult information: Requesting Attending Physician : Raymond Glean Devonshire, MD Service Requesting Consult : Surg Vascular (SRV) Primary Care Provider: [name], DO Impression: Raymond Lamb is a 37 y.o. male admitted for worsening foot wound . We have been consulted at the request of Raymond Glean Devonshire, MD to evaluate Raymond Lamb for hyperglycemia.   Medical Decision Making: Diagnoses: 1.Type 1 Diabetes. Uncontrolled With severe hypoglycemia last 24 hours.  2. Nutrition: Complicating glycemic control. Increasing risk for both hypoglycemia and hyperglycemia. 3. Infection. Complicating glycemic control and increasing risk for hyperglycemia. 4. End Stage Renal Disease. Complicating glycemic control and increasing risk for hypoglycemia. 5. Coronary Artery Disease. Complicating glycemic control and increasing risk for hyperglycemia and hypoglycemia. 6. Peripheral Vascular Disease. Complicating glycemic control and increasing risk for hyperglycemia.   Studies reviewed 04/28/24: Labs: CBC, BMP, POCT-BG, and HbA1C Interpretation: +Leukocytosis. Anemia noted. Normal potassium. Elevated Cr with reduced GFR in setting of known ESRD. Intermittent hyperglycemia. Severe hypoglycemia. A1C 9.4% (Outside records) indicates poor control outpatient. Notes reviewed: Primary team and nursing notes   Overall impression based on above reviews and history: Hypoglycemia in patient with known type 1 diabetes complicated by acute illness. Severe hypoglycemia after overestimating carbs. Since then BG in goal range. Continue insulin  pump.  Has pump supplies. CGM is good for now, but may need one in future for which we can provide.    Recommendations: - Insulin  pump - Hypoglycemia protocol. - POCT-BG achs. - Ensure patient is on glucose precautions if patient taking nutrition by mouth.   If need to disconnect pump place on: Lantus  7 units daily Lispro  2 units with meals  Lispro correction target 140, ISF 50 achs  Discharge planning: In process. Will complete actual plan closer to discharge.  Will discharge on home pump.  Thank you for this consult. Discussed plan with primary team. We will continue to follow and make recommendations and place orders as appropriate.  Please page with questions or concerns: Raymond Goley, NP: [phone] DCT on call from 6AM - 3PM on weekdays then endocrine fellow on call: [phone] from 3PM - 6AM on weekdays and on weekends and holidays.  If APP cannot be reached, please page the endocrine fellow on call.   Subjective: Initial HPI: Raymond Lamb is a 38 y.o. male with past medical history of peripheral arterial disease, LUE DVT, T1DM, ESRD (on HD M/W/F), HTN, anoxic brain injury 05/2015, and NSTEMI  admitted for worsening foot wound.   Diabetes History: Patient has a history of Type 1 diabetes. Diabetes is managed by: Dr. Carlin at [facility]. Current home diabetes regimen:  OmniPod 5 in auto mode Basal 0.35 ICR 1 (does not carb count. Estimates units needed per meal)  ISF 80 Target 130   If pump failure: Lantus  8 units daily Lispro ~3 units with meals  ISF 1:50   Current home blood glucose monitoring dexcom G7. Hypoglycemia awareness: Unclear. Has a history of severe hypo requiring EMS and glucagon. Complications related to diabetes: retinopathy and ESRD on dialysis   Current Nutrition: Active Orders  Diet   NPO Sips with meds; Operating room   Nutrition Therapy Regular/House    ROS: As per HPI.  Scheduled Medications[1]  Current Outpatient Medications  Medication Instructions  . alcohol swabs PadM 1 each, Topical (Top), 4 times a day  . aspirin  (ECOTRIN) 81 mg, Oral, Daily (standard)  . atorvastatin  (LIPITOR ) 80 mg, Oral, Daily (standard)  . blood-glucose meter,continuous (DEXCOM  G6 RECEIVER) Misc Dispense 1 Dexcom G6 receiver  . blood-glucose meter,continuous (DEXCOM G7  RECEIVER) Misc Use as instructed with G7 sensors  . blood-glucose sensor (DEXCOM G6 SENSOR) Devi Dispense 1 Dexcom G6 sensor every 10 days  . blood-glucose transmitter (DEXCOM G6 TRANSMITTER) Devi Dispense 1 Dexcom G6 transmitter every 90 days  . cholecalciferol, vitamin D3-50 mcg, 2,000 unit,, 50 mcg (2,000 unit) tablet 1 tablet, Daily (standard)  . cinacalcet  (SENSIPAR ) 90 mg, Oral, Daily (standard)  . clopidogrel  (PLAVIX ) 75 mg, Oral, Daily (standard)  . diclofenac sodium (VOLTAREN) 4 g, Topical, 4 times a day PRN  . ezetimibe  (ZETIA ) 10 mg, Oral, Daily (standard)  . ferric citrate  (AURYXIA ) 210 mg iron  Tab tablet Take two tablets by mouth with meals, three times a day. Swallow whole.  Do not chew or crush medication.  . gabapentin  (NEURONTIN ) 600 mg, Oral, Nightly  . glucagon spray 3 mg/actuation Spry Use 1 spray in 1 nostril for severe hypoglycemia, as per package instructions  . HumaLOG KwikPen Insulin  10 Units, Subcutaneous, 3 times a day (AC)  . insulin  lispro (HUMALOG) 100 unit/mL injection Vials to be used in the pump to allow up to 50 units per day.  . insulin  pmp cart,aut,G6/7,cntr (OMNIPOD 5 G6-G7 INTRO KT,GEN5,) Crtg INTRO KIT CONTAIN CONTROLLER AND PODS, TO ALLOW POD CHANGE EVERY 2 DAYS  . insulin  pump cart,auto,BT,G6/7 (OMNIPOD 5 G6-G7 PODS, GEN 5,) Crtg Change pod every 2 days. Pod must be compatible with Dexcom G 6 and G 7.  . insulin  syringe-needle U-100 0.5 mL 31 gauge x 5/16 (8 mm) Syrg Use with insulin  Vials, 4 times per day before each meal or as needed to correct sugar.  . isosorbide  mononitrate (IMDUR ) 30 mg, Oral, Daily (standard)  . lancets Misc use to test blood sugar Four (4) times a day with a meal and nightly.  . lancets Misc Use to check blood sugar, up to 5 times a day  . methocarbamol  (ROBAXIN ) 1,000 mg, Oral, 2 times a day (standard)  . metoPROLOL  succinate (TOPROL  XL) 100 mg, Oral, Daily (standard)  . mirtazapine  (REMERON ) 30 mg, Oral, Nightly  . mupirocin  (BACTROBAN) 2 % ointment Topical, 2 times a day (standard), Apply on open areas on the legs until they heal in  . naloxone (NARCAN) 4 mg, Alternating Nares, Once as needed, One spray in either nostril once for known/suspected opioid overdose. May repeat every 2-3 minutes in alternating nostril til EMS arrives  . OMNIPOD 5 PACK PODS Use 1 Pod every 3 days  . oxyCODONE  (ROXICODONE ) 5 mg, Oral, Every 4 hours PRN  . pantoprazole  (PROTONIX ) 40 mg, Oral, Daily before breakfast  . patiromer  calcium  sorbitex (VELTASSA ) 8.4 gram powder packet Take 1 packet daily on non-dialysis days only  . pen needle, diabetic (UNIFINE PENTIPS) 32 gauge x 5/32 (4 mm) Ndle Use to give insulin  5 times a day  . predniSONE (DELTASONE) 50 mg, Oral, Take as directed, Give 13 hours, 7 hours and 1 hour before contrast procedure.  . sertraline  (ZOLOFT ) 150 mg, Oral, Daily (standard)  . sucralfate (CARAFATE) 1 g, Oral, 4 times a day PRN  . tizanidine (ZANAFLEX) 2 MG tablet TAKE 2 TABLETS BY MOUTH TWICE DAILY AS NEEDED FOR MUSCLE SPASM  . tizanidine (ZANAFLEX) 2 mg, Oral, Every 6 hours PRN  . triamcinolone (KENALOG) 0.1 % ointment Topical, 2 times a day (standard), Put on the scabby areas on the legs      Past Medical History[2]  Past Surgical History[3]  Family History[4]  Short Social History[5]  OBJECTIVE: BP 149/65   Pulse 85   Temp 37 C (98.6 F) (Oral)   Resp 14   SpO2 94%  Wt Readings from Last 12 Encounters:  04/04/24 70.3 kg (155 lb)  03/26/24 70.3 kg (155 lb)  02/21/24 68 kg (150 lb)  01/29/24 66.2 kg (146 lb)  01/21/24 66.2 kg (146 lb)  01/15/24 (P) 65.3 kg (144 lb)  12/19/23 65.3 kg (144 lb)  12/06/23 66 kg (145 lb 8.1 oz)  10/16/23 65.8 kg (145 lb)  09/21/23 65.8 kg (145 lb)  09/14/23 66 kg (145 lb 9.6 oz)  08/28/23 67.1 kg (148 lb)   Physical Exam Vitals and nursing note reviewed.  Constitutional:      Appearance: He is not ill-appearing.  Skin:    General: Skin is warm and dry.      Comments: Pump and CGM on L mid abdomen  Neurological:     General: No focal deficit present.     Mental Status: He is alert and oriented to person, place, and time.       BG/insulin  reviewed per EMR.  Glucose, POC (mg/dL)  Date Value  88/96/7974 135  04/27/2024 107  03/26/2024 165  03/26/2024 118  01/30/2024 230 (H)  01/30/2024 220 (H)  01/30/2024 521 (HH)  01/29/2024 310 (H)     Summary of labs: Lab Results  Component Value Date   A1C 7.6 (H) 12/11/2023   A1C 8.6 (H) 09/10/2023   A1C 7.8 (H) 06/01/2023   Lab Results  Component Value Date   CREATININE 9.89 (H) 04/28/2024   Lab Results  Component Value Date   WBC 14.0 (H) 04/28/2024   HGB 9.0 (L) 04/28/2024   HCT 28.2 (L) 04/28/2024   PLT 334 04/28/2024    Lab Results  Component Value Date   NA 139 04/28/2024   K 4.3 04/28/2024   CL 97 (L) 04/28/2024   CO2 24.0 04/28/2024   BUN 56 (H) 04/28/2024   CREATININE 9.89 (H) 04/28/2024   GLU 158 04/28/2024   CALCIUM  9.0 04/28/2024   MG 1.8 04/28/2024   PHOS 5.0 04/28/2024    Lab Results  Component Value Date   BILITOT <0.2 (L) 04/27/2024   BILIDIR <0.10 11/20/2020   PROT 8.5 (H) 04/27/2024   ALBUMIN 3.2 (L) 04/27/2024   ALT 19 04/27/2024   AST 30 04/27/2024   ALKPHOS 94 04/27/2024   GGT 20 09/10/2017             [1] . acetaminophen   1,000 mg Oral Q8H  . aspirin   81 mg Oral Daily  . atorvastatin   80 mg Oral Daily  . cefepime   1 g Intravenous Q24H  . ezetimibe   10 mg Oral Daily  . gabapentin   600 mg Oral Nightly  . heparin  (porcine) for subcutaneous use  5,000 Units Subcutaneous Q8H SCH  . insulin  lispro  0-20 Units Subcutaneous ACHS  . isosorbide  mononitrate  30 mg Oral Daily  . metoPROLOL  tartrate  50 mg Oral BID  . metroNIDAZOLE  500 mg Intravenous Q8H  . mirtazapine   30 mg Oral Nightly  . pantoprazole   40 mg Oral Daily before breakfast  . sertraline   150 mg Oral Daily  . sevelamer  800 mg Oral 3xd Meals  [2] Past Medical  History: Diagnosis Date  . Atrial fibrillation    (CMS-HCC) 12/13/2020   ECG 06-16-2015 noted Atrial Fib with RVR noted during ED to Adm after PEA arrest.  .  Cardiac arrest (CMS-HCC) 05/2015  . Cardiomyopathy (CMS-HCC)   . DKA (diabetic ketoacidoses) 04/20/2016  . DVT (deep venous thrombosis) (CMS-HCC)   . ESRD (end stage renal disease) on dialysis    (CMS-HCC)    T Th Sat  . GERD (gastroesophageal reflux disease) 08/03/2021  . Hemiplegia and hemiparesis following cerebral infarction affecting left non-dominant side    (CMS-HCC) 08/08/2022  . Hypertension   . Myocardial infarction (CMS-HCC)   . Peripheral arterial occlusive disease   . 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 Stroke    (CMS-HCC) 05/2015   left sided weakness, gets around with a wheelchair since stroke  . Type 1 diabetes (CMS-HCC)    Dignosed age 65. 2 episodes of DKA in past  . 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 AV FISTULA REPAIR  01/06/2019   Ballon angioplasty of stenotic basillic vein  . NASAL FRACTURE SURGERY     as teenager  . PR AMPUTATION FOOT,TRANSMETATARSAL Left 03/23/2016   Procedure: AMPUTATION, FOOT; TRANSMETATARSAL;  Surgeon: Selinda Bernardino Baston, MD;  Location: MAIN OR [facility];  Service: Vascular  . PR AMPUTATION METATARSAL+TOE,SINGLE Left 01/14/2016   Procedure: AMPUTATION, METATARSAL, WITH TOE SINGLE. L 4th toe;  Surgeon: Lawayne Patience, MD;  Location: MAIN OR [facility];  Service: Vascular  . PR AV ANAST,FOREARM VEIN TRANSPOSITION Left 05/08/2016   Procedure: Left Radial Cephalic fisutal vs Brachial Cephalic Fistula;  Surgeon: Lawayne Patience, MD;  Location: MAIN OR [facility];  Service: Vascular  . PR CATH PLACE/CORON ANGIO, IMG SUPER/INTERP,W LEFT HEART VENTRICULOGRAPHY N/A 12/21/2015   Procedure: Left Heart Catheterization;  Surgeon: Suella Finders, MD;  Location: [facility] CATH;  Service: Cardiology  . PR CATH PLACE/CORON ANGIO, IMG SUPER/INTERP,W LEFT  HEART VENTRICULOGRAPHY N/A 11/18/2020   Procedure: Left Heart Catheterization;  Surgeon: Norleen Deward Grumbling, MD;  Location: [facility] CATH;  Service: Cardiology  . PR DEBRIDEMENT BONE 1ST 20 SQ CM/< Left 03/23/2016   Procedure: DEBRIDEMENT; SKIN, SUBCUTANEOUS TISSUE, MUSCLE, & BONE FOOT;  Surgeon: Selinda Bernardino Baston, MD;  Location: MAIN OR [facility];  Service: Vascular  . PR EXPLORATION N/FLWD SURG LOWER EXTREMITY ARTERY Left 03/26/2024   Procedure: EXPLORATION NOT FOLLOWED BY SURGICAL REPAIR, ARTERY; LOWER EXTREMITY (EG, COMMON FEMORAL, DEEP FEMORAL, SUPERFICIAL FEMORAL, POPLITEAL, TIBIAL, PERONEAL);  Surgeon: Patience Lawayne, MD;  Location: OR [facility];  Service: Vascular  . PR VEIN BYPASS GRAFT,FEM-TIBIAL Left 01/03/2016   Procedure: Fem-Peroneal Bypass with Ipsilateral GSV;  Surgeon: Lawayne Patience, MD;  Location: MAIN OR [facility];  Service: Vascular  . RSFA stent    [4] Family History Problem Relation Age of Onset  . Cancer Paternal Uncle        brain  . Diabetes Maternal Grandmother   . Hypertension Maternal Grandmother   . Diabetes Paternal Grandmother   . Hypertension Paternal Grandmother   . Heart disease Paternal Grandfather   . COPD Paternal Grandfather        Lung cancer  . Kidney disease Other        multiple family members  . Hypertension Other        multiple family members  . Diabetes type I Other        on both sides of family  . Lung cancer Other        grandmother  . Clotting disorder Neg Hx   . Anesthesia problems Neg Hx   . Glaucoma Neg Hx   . Macular degeneration Neg Hx   . Bleeding Disorder Neg Hx   [  5] Social History Tobacco Use  . Smoking status: Some Days    Current packs/day: 0.20    Types: Cigars, Cigarettes    Start date: 2019    Passive exposure: Past  . Smokeless tobacco: Never  . Tobacco comments:    Smoking 2 1/2 black and mild cigars a day  Vaping Use  . Vaping status: Former  Substance Use Topics  . Alcohol use: No    Alcohol/week: 0.0 standard drinks  of alcohol    Comment: socially, very seldom  . Drug use: Yes    Types: Marijuana

## 2024-04-28 NOTE — Procedures (Signed)
 [facility] Nephrology Hemodialysis Procedure Note   04/28/2024  Raymond Lamb was seen and examined on hemodialysis  CHIEF COMPLAINT: End Stage Renal Disease  INTERVAL HISTORY: Presented with foot pain, admitted for worsening foot wound in the setting of PAD.   DIALYSIS TREATMENT DATA:   Patient Goal Weight (kg): 2 kg (4 lb 6.6 oz)  Pre-Treatment Weight (kg):  (UTW, infected foot and increased pain)   Dialysis Bath Bath: 3 K+ / 2.5 Ca+ Dialysate Na (mEq/L): 137 mEq/L Dialysate HCO3 (mEq/L): 35 mEq/L Dialyzer: F-180 (98 mLs)  Blood Flow Rate (mL/min): 400 mL/min Dialysis Flow (mL/min): 800 mL/min  Machine Temperature (C): 36.5 C (97.7 F)    PHYSICAL EXAM: Vitals: Temp:  [37 C (98.6 F)-37.3 C (99.2 F)] 37 C (98.6 F) Pulse:  [84-102] 98 SpO2 Pulse:  [85-95] 85 BP: (102-208)/(62-105) 102/70 MAP (mmHg):  [89-122] 89  General: in no acute distress, currently dialyzing in a Hemodialysis Recliner Pulmonary: normal respiratory effort Cardiovascular: regular rate and rhythm Extremities: no significant  edema Access: LUE AV fistula  LAB DATA: Lab Results  Component Value Date   NA 139 04/28/2024   K 4.8 04/28/2024   CL 96 (L) 04/28/2024   CO2 22.0 04/28/2024   BUN 57 (H) 04/28/2024   CREATININE 10.82 (H) 04/28/2024   CALCIUM  9.9 04/28/2024   MG 1.8 04/28/2024   PHOS 4.5 04/28/2024   ALBUMIN 3.2 (L) 04/27/2024    Lab Results  Component Value Date   HCT 29.3 (L) 04/28/2024   WBC 16.0 (H) 04/28/2024     ASSESSMENT/PLAN: End Stage Renal Disease on Intermittent Hemodialysis: UF goal: 3L as tolerated Adjust medications for a GFR <10 Avoid nephrotoxic agents Last HD Treatment:Started (04/28/24)   Bone Mineral Metabolism: Lab Results  Component Value Date   CALCIUM  9.9 04/28/2024   CALCIUM  9.0 04/28/2024   Lab Results  Component Value Date   ALBUMIN 3.2 (L) 04/27/2024   ALBUMIN 3.6 12/19/2023    Lab Results  Component Value Date   PHOS 4.5 04/28/2024    PHOS 5.0 04/28/2024   Lab Results  Component Value Date   PTH 194.4 (H) 06/23/2015   PTH 248.1 (H) 03/21/2015    Labs appropriate, no changes.  Anemia:  Lab Results  Component Value Date   HGB 9.2 (L) 04/28/2024   HGB 9.0 (L) 04/28/2024   HGB 10.4 (L) 04/27/2024   Iron  Saturation (%)  Date Value Ref Range Status  05/14/2019 35 20 - 50 % Final    Lab Results  Component Value Date   FERRITIN 209.0 07/09/2015     Received Mircera outpatient 1 week ago, will start Retacrit with treatments later this week.  Vascular Access: Vascular Access functioning well - no need for intervention Blood Flow Rate (mL/min): 400 mL/min  IV Antibiotics to be administered at discharge: TBD  This procedure was fully reviewed with the patient and/or their decision-maker. The risks, benefits, and alternatives were discussed prior to the procedure. All questions were answered and written informed consent was obtained.  [name], MD [facility] Division of Nephrology & Hypertension

## 2024-04-29 NOTE — Consults (Signed)
 Endocrine Team Diabetes New Consult Note   Consult information: Requesting Attending Physician : [name], MD Service Requesting Consult : Surg Vascular (SRV) Primary Care Provider: [name], DO Impression: Raymond Lamb is a 38 y.o. male admitted for worsening foot wound . We have been consulted at the request of [name], MD to evaluate Raymond Lamb for hyperglycemia.   Medical Decision Making: Diagnoses: 1.Type 1 Diabetes. Uncontrolled With severe hypoglycemia last 24 hours.  2. Nutrition: Complicating glycemic control. Increasing risk for both hypoglycemia and hyperglycemia. 3. Infection. Complicating glycemic control and increasing risk for hyperglycemia. 4. End Stage Renal Disease. Complicating glycemic control and increasing risk for hypoglycemia. 5. Coronary Artery Disease. Complicating glycemic control and increasing risk for hyperglycemia and hypoglycemia. 6. Peripheral Vascular Disease. Complicating glycemic control and increasing risk for hyperglycemia.   Studies reviewed 04/29/24: Labs: CBC, BMP, POCT-BG, and HbA1C Interpretation: +Leukocytosis. Anemia noted. Normal potassium. Elevated Cr with reduced GFR in setting of known ESRD. Intermittent hyperglycemia. Severe hypoglycemia. A1C 9.4% (Outside records) indicates poor control outpatient. Notes reviewed: Primary team and nursing notes   Overall impression based on above reviews and history: Hypoglycemia in patient with known type 1 diabetes complicated by acute illness. Ran out of insulin  last night and was placed on MDI. NPO for OR today. Once stable and has all pump supplies will assist with getting back on pump.    Recommendations: -Lantus  7 units daily -Lispro 2 units with meals  -Lispro correction target 140, ISF 50 achs - Hypoglycemia protocol. - POCT-BG achs. - Ensure patient is on glucose precautions if patient taking nutrition by mouth.     Discharge planning: In  process. Will complete actual plan closer to discharge.  Will discharge on home pump.  Thank you for this consult. Discussed plan with primary team. We will continue to follow and make recommendations and place orders as appropriate.  Please page with questions or concerns: April Goley, NP: [phone] DCT on call from 6AM - 3PM on weekdays then endocrine fellow on call: [phone] from 3PM - 6AM on weekdays and on weekends and holidays.  If APP cannot be reached, please page the endocrine fellow on call.   Subjective: Interval history: NPO for OR. Drowsy at time of rounds.   Initial HPI: Raymond Lamb is a 38 y.o. male with past medical history of peripheral arterial disease, LUE DVT, T1DM, ESRD (on HD M/W/F), HTN, anoxic brain injury 05/2015, and NSTEMI  admitted for worsening foot wound.   Diabetes History: Patient has a history of Type 1 diabetes. Diabetes is managed by: Dr. Carlin at [facility]. Current home diabetes regimen:  OmniPod 5 in auto mode Basal 0.35 ICR 1 (does not carb count. Estimates units needed per meal)  ISF 80 Target 130   If pump failure: Lantus  8 units daily Lispro ~3 units with meals  ISF 1:50   Current home blood glucose monitoring dexcom G7. Hypoglycemia awareness: Unclear. Has a history of severe hypo requiring EMS and glucagon. Complications related to diabetes: retinopathy and ESRD on dialysis   Current Nutrition: Active Orders  Diet   NPO Sips with meds; Operating room    ROS: As per HPI.  Scheduled Medications[1]  Current Outpatient Medications  Medication Instructions  . alcohol swabs PadM 1 each, Topical (Top), 4 times a day  . aspirin  (ECOTRIN) 81 mg, Oral, Daily (standard)  . atorvastatin  (LIPITOR ) 80 mg, Oral, Daily (standard)  . blood-glucose meter,continuous (DEXCOM G6 RECEIVER) Misc Dispense 1 Dexcom G6  receiver  . blood-glucose meter,continuous (DEXCOM G7 RECEIVER) Misc Use as instructed with G7 sensors  . blood-glucose  sensor (DEXCOM G6 SENSOR) Devi Dispense 1 Dexcom G6 sensor every 10 days  . blood-glucose transmitter (DEXCOM G6 TRANSMITTER) Devi Dispense 1 Dexcom G6 transmitter every 90 days  . cholecalciferol, vitamin D3-50 mcg, 2,000 unit,, 50 mcg (2,000 unit) tablet 1 tablet, Daily (standard)  . cinacalcet  (SENSIPAR ) 90 mg, Oral, Daily (standard)  . clopidogrel  (PLAVIX ) 75 mg, Oral, Daily (standard)  . ezetimibe  (ZETIA ) 10 mg, Oral, Daily (standard)  . ferric citrate  (AURYXIA ) 210 mg iron  Tab tablet Take two tablets by mouth with meals, three times a day. Swallow whole.  Do not chew or crush medication.  . gabapentin  (NEURONTIN ) 600 mg, Oral, Nightly  . glucagon spray 3 mg/actuation Spry Use 1 spray in 1 nostril for severe hypoglycemia, as per package instructions  . HumaLOG KwikPen Insulin  10 Units, Subcutaneous, 3 times a day (AC)  . insulin  lispro (HUMALOG) 100 unit/mL injection Vials to be used in the pump to allow up to 50 units per day.  . insulin  pmp cart,aut,G6/7,cntr (OMNIPOD 5 G6-G7 INTRO KT,GEN5,) Crtg INTRO KIT CONTAIN CONTROLLER AND PODS, TO ALLOW POD CHANGE EVERY 2 DAYS  . insulin  pump cart,auto,BT,G6/7 (OMNIPOD 5 G6-G7 PODS, GEN 5,) Crtg Change pod every 2 days. Pod must be compatible with Dexcom G 6 and G 7.  . insulin  syringe-needle U-100 0.5 mL 31 gauge x 5/16 (8 mm) Syrg Use with insulin  Vials, 4 times per day before each meal or as needed to correct sugar.  . ipratropium (ATROVENT ) 42 mcg (0.06 %) nasal spray 2 sprays, Each Nare, 4 times a day  . isosorbide  mononitrate (IMDUR ) 30 mg, Oral, Daily (standard)  . lancets Misc use to test blood sugar Four (4) times a day with a meal and nightly.  . lancets Misc Use to check blood sugar, up to 5 times a day  . lidocaine  2 % Soln 15 mL, Oral, Every 3 hours PRN  . methocarbamol  (ROBAXIN ) 1,000 mg, Oral, 2 times a day (standard)  . metoPROLOL  succinate (TOPROL  XL) 100 mg, Oral, Daily (standard)  . mirtazapine  (REMERON ) 30 mg, Oral, Nightly  .  naloxone (NARCAN) 4 mg, Alternating Nares, Once as needed, One spray in either nostril once for known/suspected opioid overdose. May repeat every 2-3 minutes in alternating nostril til EMS arrives  . OMNIPOD 5 PACK PODS Use 1 Pod every 3 days  . ondansetron  (ZOFRAN -ODT) 4 mg, orally disintegrating tablet, Every 6 hours PRN  . oxyCODONE  (ROXICODONE ) 5 mg, Oral, Every 4 hours PRN  . pantoprazole  (PROTONIX ) 40 mg, Oral, Daily before breakfast  . patiromer  calcium  sorbitex (VELTASSA ) 8.4 gram powder packet Take 1 packet daily on non-dialysis days only  . pen needle, diabetic (UNIFINE PENTIPS) 32 gauge x 5/32 (4 mm) Ndle Use to give insulin  5 times a day  . predniSONE (DELTASONE) 50 mg, Oral, Take as directed, Give 13 hours, 7 hours and 1 hour before contrast procedure.  . sertraline  (ZOLOFT ) 150 mg, Oral, Daily (standard)  . sucralfate (CARAFATE) 1 g, Oral, 4 times a day PRN  . tizanidine (ZANAFLEX) 2 MG tablet TAKE 2 TABLETS BY MOUTH TWICE DAILY AS NEEDED FOR MUSCLE SPASM  . triamcinolone (KENALOG) 0.1 % ointment Topical, 2 times a day (standard), Put on the scabby areas on the legs      Past Medical History[2]  Past Surgical History[3]  Family History[4]  Short Social History[5]  OBJECTIVE: BP 122/76  Pulse 93   Temp 36.8 C (98.2 F) (Oral)   Resp 18   SpO2 99%  Wt Readings from Last 12 Encounters:  04/04/24 70.3 kg (155 lb)  03/26/24 70.3 kg (155 lb)  02/21/24 68 kg (150 lb)  01/29/24 66.2 kg (146 lb)  01/21/24 66.2 kg (146 lb)  01/15/24 (P) 65.3 kg (144 lb)  12/19/23 65.3 kg (144 lb)  12/06/23 66 kg (145 lb 8.1 oz)  10/16/23 65.8 kg (145 lb)  09/21/23 65.8 kg (145 lb)  09/14/23 66 kg (145 lb 9.6 oz)  08/28/23 67.1 kg (148 lb)   Physical Exam Vitals and nursing note reviewed.  Constitutional:      Appearance: He is not ill-appearing.  Skin:    General: Skin is warm and dry.  Neurological:     General: No focal deficit present.     Mental Status: He is alert and  oriented to person, place, and time.       BG/insulin  reviewed per EMR.  Glucose, POC (mg/dL)  Date Value  88/95/7974 144  04/29/2024 178  04/29/2024 98  04/29/2024 77  04/29/2024 71  04/29/2024 68 (L)  04/29/2024 62 (L)  04/28/2024 181 (H)     Summary of labs: Lab Results  Component Value Date   A1C 7.6 (H) 12/11/2023   A1C 8.6 (H) 09/10/2023   A1C 7.8 (H) 06/01/2023   Lab Results  Component Value Date   CREATININE 7.06 (H) 04/29/2024   Lab Results  Component Value Date   WBC 10.1 04/29/2024   HGB 9.0 (L) 04/29/2024   HCT 28.4 (L) 04/29/2024   PLT 346 04/29/2024    Lab Results  Component Value Date   NA 138 04/29/2024   K 3.9 04/29/2024   CL 93 (L) 04/29/2024   CO2 27.0 04/29/2024   BUN 30 (H) 04/29/2024   CREATININE 7.06 (H) 04/29/2024   GLU 141 04/29/2024   CALCIUM  9.7 04/29/2024   MG 1.8 04/29/2024   PHOS 5.3 (H) 04/29/2024    Lab Results  Component Value Date   BILITOT <0.2 (L) 04/27/2024   BILIDIR <0.10 11/20/2020   PROT 8.5 (H) 04/27/2024   ALBUMIN 3.2 (L) 04/27/2024   ALT 19 04/27/2024   AST 30 04/27/2024   ALKPHOS 94 04/27/2024   GGT 20 09/10/2017              [1] . acetaminophen   1,000 mg Oral Q8H  . aspirin   81 mg Oral Daily  . atorvastatin   80 mg Oral Daily  . [Provider Hold] cefepime   1 g Intravenous Q24H  . ezetimibe   10 mg Oral Daily  . gabapentin   600 mg Oral Nightly  . heparin  (porcine) for subcutaneous use  5,000 Units Subcutaneous Q8H SCH  . insulin  glargine  7 Units Subcutaneous Nightly  . [Provider Hold] insulin  lispro  2 Units Subcutaneous 3xd Meals  . insulin  lispro  0-20 Units Subcutaneous ACHS  . isosorbide  mononitrate  30 mg Oral Daily  . metoPROLOL  tartrate  50 mg Oral BID  . metroNIDAZOLE  500 mg Oral TID  . mirtazapine   30 mg Oral Nightly  . pantoprazole   40 mg Oral Daily before breakfast  . sertraline   150 mg Oral Daily  . sevelamer  800 mg Oral 3xd Meals  [2] Past Medical History: Diagnosis  Date  . Atrial fibrillation    (CMS-HCC) 12/13/2020   ECG 06-16-2015 noted Atrial Fib with RVR noted during ED to Adm after PEA arrest.  . Cardiac  arrest (CMS-HCC) 05/2015  . Cardiomyopathy (CMS-HCC)   . DKA (diabetic ketoacidoses) 04/20/2016  . DVT (deep venous thrombosis) (CMS-HCC)   . ESRD (end stage renal disease) on dialysis    (CMS-HCC)    T Th Sat  . GERD (gastroesophageal reflux disease) 08/03/2021  . Hemiplegia and hemiparesis following cerebral infarction affecting left non-dominant side    (CMS-HCC) 08/08/2022  . Hypertension   . Myocardial infarction (CMS-HCC)   . Peripheral arterial occlusive disease   . 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 Stroke    (CMS-HCC) 05/2015   left sided weakness, gets around with a wheelchair since stroke  . Type 1 diabetes (CMS-HCC)    Dignosed age 3. 2 episodes of DKA in past  . 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 AV FISTULA REPAIR  01/06/2019   Ballon angioplasty of stenotic basillic vein  . NASAL FRACTURE SURGERY     as teenager  . PR AMPUTATION FOOT,TRANSMETATARSAL Left 03/23/2016   Procedure: AMPUTATION, FOOT; TRANSMETATARSAL;  Surgeon: Selinda Bernardino Baston, MD;  Location: MAIN OR [facility];  Service: Vascular  . PR AMPUTATION METATARSAL+TOE,SINGLE Left 01/14/2016   Procedure: AMPUTATION, METATARSAL, WITH TOE SINGLE. L 4th toe;  Surgeon: Lawayne Patience, MD;  Location: MAIN OR [facility];  Service: Vascular  . PR AV ANAST,FOREARM VEIN TRANSPOSITION Left 05/08/2016   Procedure: Left Radial Cephalic fisutal vs Brachial Cephalic Fistula;  Surgeon: Lawayne Patience, MD;  Location: MAIN OR [facility];  Service: Vascular  . PR CATH PLACE/CORON ANGIO, IMG SUPER/INTERP,W LEFT HEART VENTRICULOGRAPHY N/A 12/21/2015   Procedure: Left Heart Catheterization;  Surgeon: Suella Finders, MD;  Location: [facility] CATH;  Service: Cardiology  . PR CATH PLACE/CORON ANGIO, IMG SUPER/INTERP,W LEFT HEART  VENTRICULOGRAPHY N/A 11/18/2020   Procedure: Left Heart Catheterization;  Surgeon: Norleen Deward Grumbling, MD;  Location: [facility] CATH;  Service: Cardiology  . PR DEBRIDEMENT BONE 1ST 20 SQ CM/< Left 03/23/2016   Procedure: DEBRIDEMENT; SKIN, SUBCUTANEOUS TISSUE, MUSCLE, & BONE FOOT;  Surgeon: Selinda Bernardino Baston, MD;  Location: MAIN [facility];  Service: Vascular  . PR EXPLORATION N/FLWD SURG LOWER EXTREMITY ARTERY Left 03/26/2024   Procedure: EXPLORATION NOT FOLLOWED BY SURGICAL REPAIR, ARTERY; LOWER EXTREMITY (EG, COMMON FEMORAL, DEEP FEMORAL, SUPERFICIAL FEMORAL, POPLITEAL, TIBIAL, PERONEAL);  Surgeon: Patience Lawayne, MD;  Location: OR [facility];  Service: Vascular  . PR VEIN BYPASS GRAFT,FEM-TIBIAL Left 01/03/2016   Procedure: Fem-Peroneal Bypass with Ipsilateral GSV;  Surgeon: Lawayne Patience, MD;  Location: MAIN [facility];  Service: Vascular  . RSFA stent    [4] Family History Problem Relation Age of Onset  . Cancer Paternal Uncle        brain  . Diabetes Maternal Grandmother   . Hypertension Maternal Grandmother   . Diabetes Paternal Grandmother   . Hypertension Paternal Grandmother   . Heart disease Paternal Grandfather   . COPD Paternal Grandfather        Lung cancer  . Kidney disease Other        multiple family members  . Hypertension Other        multiple family members  . Diabetes type I Other        on both sides of family  . Lung cancer Other        grandmother  . Clotting disorder Neg Hx   . Anesthesia problems Neg Hx   . Glaucoma Neg Hx   . Macular degeneration Neg Hx   . Bleeding Disorder Neg Hx   [  5] Social History Tobacco Use  . Smoking status: Some Days    Current packs/day: 0.20    Types: Cigars, Cigarettes    Start date: 2019    Passive exposure: Past  . Smokeless tobacco: Never  . Tobacco comments:    Smoking 2 1/2 black and mild cigars a day  Vaping Use  . Vaping status: Former  Substance Use Topics  . Alcohol use: No    Alcohol/week: 0.0 standard drinks of  alcohol    Comment: socially, very seldom  . Drug use: Yes    Types: Marijuana

## 2024-04-29 NOTE — Care Plan (Signed)
 Shift Summary Pt lost IV access before shift change. Staff and VAT had multiple unsuccessful attempts. MD notified and made aware of the situation. All IV Abx switched over to PO. Severe pain was reported in the evening and managed with PRN oxyCODONE  and melatonin, resulting in temporary relief.  Blood glucose was elevated and treated with insulin  lispro, with subsequent low readings managed by PRN oral glucose.  Antibiotics metroNIDAZOLE and levoFLOXacin were administered orally during the shift due to loss of IV access. Blood cultures at 24 hours reported no growth.  Patient remained able to feed self and was noted sleeping during reassessment; overall, comfort and infection-related parameters were stable.   Optimal Comfort and Wellbeing: Pain in the early evening was severe but improved to zero after oxyCODONE  administration, then recurred overnight; sleep was supported with melatonin and pain medication, and patient was noted sleeping during reassessment.   Absence of Infection Signs and Symptoms: Temperature remained within normal range and blood cultures at 24 hours reported no growth; sepsis risk scores stayed low throughout the shift.   Optimal Functional Ability: Patient consistently able to feed self and psychosocial status remained within defined limits.

## 2024-04-29 NOTE — Unmapped External Note (Signed)
-------------------------------------------------------------------------------   Attestation signed by Ave Bosworth, MD at 04/30/24 1228 I, Dr. Ave, was present throughout the entire procedure.  -------------------------------------------------------------------------------  Operative Note  Preoperative Diagnosis: CLTI with tissue loss   Postoperative Diagnosis: Same  Procedure Performed:  1) left AKA  Teaching Surgeon: Dr. Ave  Resident Surgeon: Merrianne MD, Peri MD  Anesthesia: General Endotracheal + Block   Specimens: Left leg  Implants: None  Estimated Blood Loss: 50 mL  Drains: None  Complications: None  Procedure Details:   The patient was correctly identified before the procedure and informed consent was reviewed. The patient understood the natural history of  his disease as well as the risks and benefits of the procedure and asked us  to proceed. The patient was taken to the operating room and placed on the operating table in supine position with a safety belt fastened across the waist.  A timeout was performed with all members of the surgical and Anesthesia teams present. The patient was placed under general anesthesia. A regional block was performed. The left leg was prepped and draped in usual sterile fashion.   Anterior and posterior skin flaps were outlined with a marking pen 8 cm proximal to the knee joint. The skin incision was then performed and deepened through the subcutaneous tissue until the muscular fascia was identified. The muscle groups over the anterior and medial thighs were divided with electrocautery at the same level of the skin incision. The neurovascular bundle was identified on the medial aspect of the thigh. The superficial femoral artery and veins were isolated and suture ligated with a 2-0 silk ligature. The periosteum of the femur was then incised and elevated proximally 5 cm above the skin incision. The femur was then divided  with a power saw and the anterior edge smoothed. The posterior muscles were then divided with electrocautery. The specimen was then passed off the field. The sciatic nerve was pulled, ligated with a 3-0 vicryl tie, divided, and buried in adjacent muscle. The amputation stump was irrigated copiously with sterile saline. Hemostasis was achieved with 3-0 silk sutures and electrocautery. A pursestring suture was used to cover the transected femur with periosteum and muscle. Finally we turned our attention to closure. Fascia was closed with interrupted 2-0 Vicryl sutures approximately 1 cm apart. Several 3-0 Vicryl sutures were placed to take tension off skin. Skin was closed with staples. The wound was dressed with Xeroform, Kerlix and Ace wrap.  All counts were correct at the conclusion of the case. The patient was awoken from anesthesia. No complications were noted.

## 2024-04-30 NOTE — Consults (Signed)
 Endocrine Team Diabetes New Consult Note   Consult information: Requesting Attending Physician : Lawayne Patience, MD Service Requesting Consult : Surg Vascular (SRV) Primary Care Provider: [name], DO Impression: Raymond Lamb is a 38 y.o. male admitted for worsening foot wound . We have been consulted at the request of Lawayne Patience, MD to evaluate Raymond Lamb for hyperglycemia.   Medical Decision Making: Diagnoses: 1.Type 1 Diabetes. Uncontrolled With severe hypoglycemia last 24 hours.  2. Nutrition: Complicating glycemic control. Increasing risk for both hypoglycemia and hyperglycemia. 3. Infection. Complicating glycemic control and increasing risk for hyperglycemia. 4. End Stage Renal Disease. Complicating glycemic control and increasing risk for hypoglycemia. 5. Coronary Artery Disease. Complicating glycemic control and increasing risk for hyperglycemia and hypoglycemia. 6. Peripheral Vascular Disease. Complicating glycemic control and increasing risk for hyperglycemia.   Studies reviewed 04/30/24: Labs: CBC, BMP, POCT-BG, and HbA1C Interpretation: +Leukocytosis. Anemia noted. Normal potassium. Elevated Cr with reduced GFR in setting of known ESRD. Intermittent hyperglycemia. Severe hypoglycemia. A1C 9.4% (Outside records) indicates poor control outpatient. Notes reviewed: Primary team and nursing notes   Overall impression based on above reviews and history: Hypoglycemia in patient with known type 1 diabetes complicated by acute illness. Ran out of insulin  last night and was placed on MDI. NPO for OR today. Once stable and has all pump supplies will assist with getting back on pump. States he prefers to stay on MDI currently.   Recommendations: -Lantus  7 units daily -Lispro 2 units with meals  -Lispro correction target 140, ISF 50 achs - Hypoglycemia protocol. - POCT-BG achs. - Ensure patient is on glucose precautions if patient taking nutrition by  mouth.     Discharge planning: In process. Will complete actual plan closer to discharge.  Will discharge on home pump.  Thank you for this consult. Discussed plan with primary team. We will continue to follow and make recommendations and place orders as appropriate.  Please page with questions or concerns: April Goley, NP: [phone] DCT on call from 6AM - 3PM on weekdays then endocrine fellow on call: [phone] from 3PM - 6AM on weekdays and on weekends and holidays.  If APP cannot be reached, please page the endocrine fellow on call.   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 C/o pain at time of rounds.   Initial HPI: Raymond Lamb is a 38 y.o. male with past medical history of peripheral arterial disease, LUE DVT, T1DM, ESRD (on HD M/W/F), HTN, anoxic brain injury 05/2015, and NSTEMI  admitted for worsening foot wound.   Diabetes History: Patient has a history of Type 1 diabetes. Diabetes is managed by: Dr. Carlin at [facility]. Current home diabetes regimen:  OmniPod 5 in auto mode Basal 0.35 ICR 1 (does not carb count. Estimates units needed per meal)  ISF 80 Target 130   If pump failure: Lantus  8 units daily Lispro ~3 units with meals  ISF 1:50   Current home blood glucose monitoring dexcom G7. Hypoglycemia awareness: Unclear. Has a history of severe hypo requiring EMS and glucagon. Complications related to diabetes: retinopathy and ESRD on dialysis   Current Nutrition: Active Orders  Diet   Nutrition Therapy Regular/House    ROS: As per HPI.  Scheduled Medications[1]  Current Outpatient Medications  Medication Instructions  . alcohol swabs PadM 1 each, Topical (Top), 4 times a day  . aspirin  (ECOTRIN) 81 mg, Oral, Daily (standard)  . atorvastatin  (LIPITOR ) 80 mg, Oral, Daily (standard)  . blood-glucose meter,continuous (DEXCOM G6 RECEIVER) Misc Dispense 1  Dexcom G6 receiver  . blood-glucose meter,continuous (DEXCOM G7 RECEIVER) Misc Use as instructed  with G7 sensors  . blood-glucose sensor (DEXCOM G6 SENSOR) Devi Dispense 1 Dexcom G6 sensor every 10 days  . blood-glucose transmitter (DEXCOM G6 TRANSMITTER) Devi Dispense 1 Dexcom G6 transmitter every 90 days  . cholecalciferol, vitamin D3-50 mcg, 2,000 unit,, 50 mcg (2,000 unit) tablet 1 tablet, Daily (standard)  . cinacalcet  (SENSIPAR ) 90 mg, Oral, Daily (standard)  . clopidogrel  (PLAVIX ) 75 mg, Oral, Daily (standard)  . ezetimibe  (ZETIA ) 10 mg, Oral, Daily (standard)  . ferric citrate  (AURYXIA ) 210 mg iron  Tab tablet Take two tablets by mouth with meals, three times a day. Swallow whole.  Do not chew or crush medication.  . gabapentin  (NEURONTIN ) 600 mg, Oral, Nightly  . glucagon spray 3 mg/actuation Spry Use 1 spray in 1 nostril for severe hypoglycemia, as per package instructions  . HumaLOG KwikPen Insulin  10 Units, Subcutaneous, 3 times a day (AC)  . insulin  lispro (HUMALOG) 100 unit/mL injection Vials to be used in the pump to allow up to 50 units per day.  . insulin  pmp cart,aut,G6/7,cntr (OMNIPOD 5 G6-G7 INTRO KT,GEN5,) Crtg INTRO KIT CONTAIN CONTROLLER AND PODS, TO ALLOW POD CHANGE EVERY 2 DAYS  . insulin  pump cart,auto,BT,G6/7 (OMNIPOD 5 G6-G7 PODS, GEN 5,) Crtg Change pod every 2 days. Pod must be compatible with Dexcom G 6 and G 7.  . insulin  syringe-needle U-100 0.5 mL 31 gauge x 5/16 (8 mm) Syrg Use with insulin  Vials, 4 times per day before each meal or as needed to correct sugar.  . ipratropium (ATROVENT ) 42 mcg (0.06 %) nasal spray 2 sprays, Each Nare, 4 times a day  . isosorbide  mononitrate (IMDUR ) 30 mg, Oral, Daily (standard)  . lancets Misc use to test blood sugar Four (4) times a day with a meal and nightly.  . lancets Misc Use to check blood sugar, up to 5 times a day  . lidocaine  2 % Soln 15 mL, Oral, Every 3 hours PRN  . methocarbamol  (ROBAXIN ) 1,000 mg, Oral, 2 times a day (standard)  . metoPROLOL  succinate (TOPROL  XL) 100 mg, Oral, Daily (standard)  . mirtazapine   (REMERON ) 30 mg, Oral, Nightly  . naloxone (NARCAN) 4 mg, Alternating Nares, Once as needed, One spray in either nostril once for known/suspected opioid overdose. May repeat every 2-3 minutes in alternating nostril til EMS arrives  . OMNIPOD 5 PACK PODS Use 1 Pod every 3 days  . ondansetron  (ZOFRAN -ODT) 4 mg, orally disintegrating tablet, Every 6 hours PRN  . oxyCODONE  (ROXICODONE ) 5 mg, Oral, Every 4 hours PRN  . pantoprazole  (PROTONIX ) 40 mg, Oral, Daily before breakfast  . patiromer  calcium  sorbitex (VELTASSA ) 8.4 gram powder packet Take 1 packet daily on non-dialysis days only  . pen needle, diabetic (UNIFINE PENTIPS) 32 gauge x 5/32 (4 mm) Ndle Use to give insulin  5 times a day  . predniSONE (DELTASONE) 50 mg, Oral, Take as directed, Give 13 hours, 7 hours and 1 hour before contrast procedure.  . sertraline  (ZOLOFT ) 150 mg, Oral, Daily (standard)  . sucralfate (CARAFATE) 1 g, Oral, 4 times a day PRN  . tizanidine (ZANAFLEX) 2 MG tablet TAKE 2 TABLETS BY MOUTH TWICE DAILY AS NEEDED FOR MUSCLE SPASM  . triamcinolone (KENALOG) 0.1 % ointment Topical, 2 times a day (standard), Put on the scabby areas on the legs      Past Medical History[2]  Past Surgical History[3]  Family History[4]  Short Social History[5]  OBJECTIVE: BP  128/80   Pulse 86   Temp 36.8 C (98.2 F) (Oral)   Resp 17   SpO2 98%  Wt Readings from Last 12 Encounters:  04/04/24 70.3 kg (155 lb)  03/26/24 70.3 kg (155 lb)  02/21/24 68 kg (150 lb)  01/29/24 66.2 kg (146 lb)  01/21/24 66.2 kg (146 lb)  01/15/24 (P) 65.3 kg (144 lb)  12/19/23 65.3 kg (144 lb)  12/06/23 66 kg (145 lb 8.1 oz)  10/16/23 65.8 kg (145 lb)  09/21/23 65.8 kg (145 lb)  09/14/23 66 kg (145 lb 9.6 oz)  08/28/23 67.1 kg (148 lb)   Physical Exam Vitals and nursing note reviewed.  Constitutional:      Appearance: He is not ill-appearing.  Skin:    General: Skin is warm and dry.  Neurological:     General: No focal deficit present.      Mental Status: He is alert and oriented to person, place, and time.       BG/insulin  reviewed per EMR.  Glucose, POC (mg/dL)  Date Value  88/95/7974 179  04/29/2024 123  04/29/2024 118  04/29/2024 117  04/29/2024 101  04/29/2024 144  04/29/2024 178  04/29/2024 98     Summary of labs: Lab Results  Component Value Date   A1C 7.6 (H) 12/11/2023   A1C 8.6 (H) 09/10/2023   A1C 7.8 (H) 06/01/2023   Lab Results  Component Value Date   CREATININE 8.69 (H) 04/30/2024   Lab Results  Component Value Date   WBC 11.5 (H) 04/30/2024   HGB 9.4 (L) 04/30/2024   HCT 29.7 (L) 04/30/2024   PLT 330 04/30/2024    Lab Results  Component Value Date   NA 138 04/30/2024   K 4.1 04/30/2024   CL 95 (L) 04/30/2024   CO2 27.0 04/30/2024   BUN 38 (H) 04/30/2024   CREATININE 8.69 (H) 04/30/2024   GLU 176 04/30/2024   CALCIUM  9.5 04/30/2024   MG 1.9 04/30/2024   PHOS 6.5 (H) 04/30/2024    Lab Results  Component Value Date   BILITOT <0.2 (L) 04/27/2024   BILIDIR <0.10 11/20/2020   PROT 8.5 (H) 04/27/2024   ALBUMIN 3.2 (L) 04/27/2024   ALT 19 04/27/2024   AST 30 04/27/2024   ALKPHOS 94 04/27/2024   GGT 20 09/10/2017               [1] . acetaminophen   1,000 mg Oral Q8H  . aspirin   81 mg Oral Daily  . atorvastatin   80 mg Oral Daily  . senna  1 tablet Oral Daily   And  . docusate sodium   100 mg Oral Daily  . ezetimibe   10 mg Oral Daily  . gabapentin   600 mg Oral Nightly  . heparin  (porcine) for subcutaneous use  5,000 Units Subcutaneous Q8H SCH  . insulin  glargine  7 Units Subcutaneous Nightly  . insulin  lispro  2 Units Subcutaneous 3xd Meals  . insulin  lispro  0-20 Units Subcutaneous ACHS  . isosorbide  mononitrate  30 mg Oral Daily  . levoFLOXacin  500 mg Oral Every other day (0600)  . magnesium oxide  400 mg Oral BID  . metoPROLOL  tartrate  50 mg Oral BID  . metroNIDAZOLE  500 mg Oral TID  . mirtazapine   30 mg Oral Nightly  . pantoprazole   40 mg Oral Daily  before breakfast  . sertraline   150 mg Oral Daily  . sevelamer  800 mg Oral 3xd Meals  [2] Past Medical History: Diagnosis  Date  . Atrial fibrillation    (CMS-HCC) 12/13/2020   ECG 06-16-2015 noted Atrial Fib with RVR noted during ED to Adm after PEA arrest.  . Cardiac arrest (CMS-HCC) 05/2015  . Cardiomyopathy (CMS-HCC)   . DKA (diabetic ketoacidoses) 04/20/2016  . DVT (deep venous thrombosis) (CMS-HCC)   . ESRD (end stage renal disease) on dialysis    (CMS-HCC)    T Th Sat  . GERD (gastroesophageal reflux disease) 08/03/2021  . Hemiplegia and hemiparesis following cerebral infarction affecting left non-dominant side    (CMS-HCC) 08/08/2022  . Hypertension   . Myocardial infarction (CMS-HCC)   . Peripheral arterial occlusive disease   . 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 Stroke    (CMS-HCC) 05/2015   left sided weakness, gets around with a wheelchair since stroke  . Type 1 diabetes (CMS-HCC)    Dignosed age 4. 2 episodes of DKA in past  . 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 AV FISTULA REPAIR  01/06/2019   Ballon angioplasty of stenotic basillic vein  . NASAL FRACTURE SURGERY     as teenager  . PR AMPUTATION FOOT,TRANSMETATARSAL Left 03/23/2016   Procedure: AMPUTATION, FOOT; TRANSMETATARSAL;  Surgeon: Selinda Bernardino Baston, MD;  Location: MAIN OR [facility];  Service: Vascular  . PR AMPUTATION METATARSAL+TOE,SINGLE Left 01/14/2016   Procedure: AMPUTATION, METATARSAL, WITH TOE SINGLE. L 4th toe;  Surgeon: Lawayne Patience, MD;  Location: MAIN OR [facility];  Service: Vascular  . PR AV ANAST,FOREARM VEIN TRANSPOSITION Left 05/08/2016   Procedure: Left Radial Cephalic fisutal vs Brachial Cephalic Fistula;  Surgeon: Lawayne Patience, MD;  Location: MAIN OR [facility];  Service: Vascular  . PR CATH PLACE/CORON ANGIO, IMG SUPER/INTERP,W LEFT HEART VENTRICULOGRAPHY N/A 12/21/2015   Procedure: Left Heart Catheterization;  Surgeon: Suella Finders, MD;  Location: [facility] CATH;  Service: Cardiology  . PR CATH PLACE/CORON ANGIO, IMG SUPER/INTERP,W LEFT HEART VENTRICULOGRAPHY N/A 11/18/2020   Procedure: Left Heart Catheterization;  Surgeon: Norleen Deward Grumbling, MD;  Location: [facility] CATH;  Service: Cardiology  . PR DEBRIDEMENT BONE 1ST 20 SQ CM/< Left 03/23/2016   Procedure: DEBRIDEMENT; SKIN, SUBCUTANEOUS TISSUE, MUSCLE, & BONE FOOT;  Surgeon: Selinda Bernardino Baston, MD;  Location: MAIN OR [facility];  Service: Vascular  . PR EXPLORATION N/FLWD SURG LOWER EXTREMITY ARTERY Left 03/26/2024   Procedure: EXPLORATION NOT FOLLOWED BY SURGICAL REPAIR, ARTERY; LOWER EXTREMITY (EG, COMMON FEMORAL, DEEP FEMORAL, SUPERFICIAL FEMORAL, POPLITEAL, TIBIAL, PERONEAL);  Surgeon: Patience Lawayne, MD;  Location: OR [facility];  Service: Vascular  . PR VEIN BYPASS GRAFT,FEM-TIBIAL Left 01/03/2016   Procedure: Fem-Peroneal Bypass with Ipsilateral GSV;  Surgeon: Lawayne Patience, MD;  Location: MAIN OR [facility];  Service: Vascular  . RSFA stent    [4] Family History Problem Relation Age of Onset  . Cancer Paternal Uncle        brain  . Diabetes Maternal Grandmother   . Hypertension Maternal Grandmother   . Diabetes Paternal Grandmother   . Hypertension Paternal Grandmother   . Heart disease Paternal Grandfather   . COPD Paternal Grandfather        Lung cancer  . Kidney disease Other        multiple family members  . Hypertension Other        multiple family members  . Diabetes type I Other        on both sides of family  . Lung cancer Other        grandmother  . Clotting disorder Neg  Hx   . Anesthesia problems Neg Hx   . Glaucoma Neg Hx   . Macular degeneration Neg Hx   . Bleeding Disorder Neg Hx   [5] Social History Tobacco Use  . Smoking status: Some Days    Current packs/day: 0.20    Types: Cigars, Cigarettes    Start date: 2019    Passive exposure: Past  . Smokeless tobacco: Never  . Tobacco comments:    Smoking 2 1/2 black and mild cigars a day  Vaping Use   . Vaping status: Former  Substance Use Topics  . Alcohol use: No    Alcohol/week: 0.0 standard drinks of alcohol    Comment: socially, very seldom  . Drug use: Yes    Types: Marijuana

## 2024-04-30 NOTE — Procedures (Signed)
 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  DIALYSIS TREATMENT DATA:   Patient Goal Weight (kg): 2 kg (4 lb 6.6 oz)  Pre-Treatment Weight (kg):  (UTW due to AKA)   Dialysis Bath Bath: 3 K+ / 2.5 Ca+ Dialysate Na (mEq/L): 137 mEq/L Dialysate HCO3 (mEq/L): 35 mEq/L Dialyzer: F-180 (98 mLs)  Blood Flow Rate (mL/min): 400 mL/min Dialysis Flow (mL/min): 800 mL/min  Machine Temperature (C): 36.5 C (97.7 F)    PHYSICAL EXAM: Vitals: Temp:  [36.3 C (97.3 F)-37 C (98.6 F)] 36.8 C (98.2 F) Pulse:  [78-96] 91 SpO2 Pulse:  [78-92] 86 BP: (108-166)/(56-133) 166/56 MAP (mmHg):  [83-141] 83  General: in no acute distress, currently dialyzing in a Bed Pulmonary: normal respiratory effort and clear to auscultation Cardiovascular: regular rate and rhythm Extremities: left AKA right no significant edema  Access: LUE AV fistula  LAB DATA: Lab Results  Component Value Date   NA 138 04/30/2024   K 4.1 04/30/2024   CL 95 (L) 04/30/2024   CO2 27.0 04/30/2024   BUN 38 (H) 04/30/2024   CREATININE 8.69 (H) 04/30/2024   CALCIUM  9.5 04/30/2024   MG 1.9 04/30/2024   PHOS 6.5 (H) 04/30/2024   ALBUMIN 3.2 (L) 04/27/2024    Lab Results  Component Value Date   HCT 29.7 (L) 04/30/2024   WBC 11.5 (H) 04/30/2024     ASSESSMENT/PLAN: End Stage Renal Disease on Intermittent Hemodialysis: UF goal: 2L as tolerated Adjust medications for a GFR <10 Avoid nephrotoxic agents Last HD Treatment:Started (04/30/24)   Bone Mineral Metabolism: Lab Results  Component Value Date   CALCIUM  9.5 04/30/2024   CALCIUM  9.7 04/29/2024   Lab Results  Component Value Date   ALBUMIN 3.2 (L) 04/27/2024   ALBUMIN 3.6 12/19/2023    Lab Results  Component Value Date   PHOS 6.5 (H) 04/30/2024   PHOS 5.3 (H)  04/29/2024   Lab Results  Component Value Date   PTH 194.4 (H) 06/23/2015   PTH 248.1 (H) 03/21/2015    Continue phosphorus binder and dietary counseling.  Anemia:  Lab Results  Component Value Date   HGB 9.4 (L) 04/30/2024   HGB 9.0 (L) 04/29/2024   HGB 9.2 (L) 04/28/2024   Iron  Saturation (%)  Date Value Ref Range Status  05/14/2019 35 20 - 50 % Final    Lab Results  Component Value Date   FERRITIN 209.0 07/09/2015     Would start ESA next treatment as received mircera a little over 1 week ago.  Vascular Access: Vascular Access functioning well - no need for intervention Blood Flow Rate (mL/min): 400 mL/min  IV Antibiotics to be administered at discharge: TBD  This procedure was fully reviewed with the patient and/or their decision-maker. The risks, benefits, and alternatives were discussed prior to the procedure. All questions were answered and written informed consent was obtained.  Montie PARAS Denu-Ciocca, MD [facility] Division of Nephrology & Hypertension

## 2024-04-30 NOTE — Care Plan (Signed)
 AxO x4. VSS. Pt bathed. Dressing remained clean, dry, and intact. Call button in reach. Shift Summary Pain escalated late in the shift, requiring multiple PRN pain medication administrations and ultimately resolved after HYDROmorphone  (PF) was given. Antibiotics levoFLOXacin and metroNIDAZOLE were administered in the evening. PRN ondansetron  was given for nausea, and melatonin was administered for sleep. Blood glucose increased during the shift and correctional insulin  was calculated and administered. Overall, comfort and skin integrity were supported, and wound dressing remained intact.  Optimal Comfort and Wellbeing: Pain in the late evening increased from 7 to 10, requiring multiple doses of oxyCODONE  and one dose of HYDROmorphone  (PF), after which pain was relieved to 0; melatonin was given for sleep and PRN non-pain medication response was noted as improving and then relieved.  Optimal Coping: Psychosocial status remained within defined limits throughout the shift.  Optimal Functional Ability: Bedrest was maintained and right upper extremity movement was limited, with left lower extremity amputation documented; patient was able to feed self but remained dependent for bathing.  Skin Health and Integrity: Braden Scale score remained stable at 18, skin moisture was rarely moist, and pressure reduction techniques and devices were consistently utilized; absorbent pads and limited adhesive use were maintained for device skin protection.  Optimal Wound Healing: Surgical site at left above knee amputation (LAKA) was unable to be assessed due to OR dressing, which remained clean, dry, and intact throughout the shift.

## 2024-05-01 NOTE — Progress Notes (Signed)
 ------------------------------------------------------------------------------- Attestation signed by Ave Bosworth, MD at 05/01/24 1230 I saw and evaluated the patient, participating in the key portions of the service.  I reviewed the resident's note.  I agree with the resident's findings and plan.   Bosworth Ave, MD Professor of Surgery Division of Vascular Surgery Pager : [phone]  -------------------------------------------------------------------------------  SRV Progress Note  Admit Date: 04/27/2024, Hospital Day: 5 Hospital Service: Surg Vascular (SRV) Attending: Bosworth Ave, MD  Assessment  Raymond Lamb is a 38 y.o. male with a PMH of past medical history of peripheral arterial disease, LUE DVT, T1DM, ESRD (on HD M/W/F), HTN, anoxic brain injury 05/2015, and NSTEMI who presents with worsening foot wound.  admitted on 04/27/2024 for management of infected foot wounds. 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 Continues to endorse pain. Endocrinology managing insulin  regimen. Confirmed with nephrology that he will receive dialysis today. PM&R consulted for AIR evaluation.   Plan  Neuro/Pain: [facility]: Tylenol  and Gabapentin , add Robaxin  today -PRN: Oxycodone , Dilaudid , Dilaudid  PCA -Continue home mirtazapine  and sertraline    Cardiovascular: -HDS -Continue home aspirin , zetia , imdur , metoprolol  - restart home Plavix  11/6   Pulmonary: -SORA -IS/OOB/ambulate  FEN/GI: -F: ML -E: Replete lytes prn - Mg 1.9, Phos 4.5, K 4.3  -N: Nutrition Therapy Regular/House -Bowel regimen: senna and colace -Nausea: zofran  prn   GU/Renal: -Voiding spontaneously -Adequate UOP -SCr  Recent Labs    04/29/24 0802 04/30/24 0455 05/01/24 0555  CREATININE 7.06* 8.69* 5.21*     Heme/ID: -Hgb  Recent Labs    04/29/24 0802 04/30/24 0455 05/01/24 0555  HGB 9.0* 9.4* 9.1*    -WBC   Recent Labs    04/29/24 0802 04/30/24 0455 05/01/24 0555  WBC 10.1 11.5* 10.1     Endocrine: #Type 1 diabetes, self pump out of insulin  - Endocrine onboard, appreciate recs    - Lantus  7 units daily    - Lispro 2 units with meals     - Lispro correction target 140, ISF 50 achs  Vascular: #Gangrene of left foot wound - Plan for OR today for left AKA   PPx: Heparin   Dispo:  Floor status PT/OT: 5xH  CM/SW is not assisting in discharge planning Barriers to discharge: need further procedures by vascular surgery  Vitals:  Temp:  [36.8 C (98.2 F)-37.9 C (100.2 F)] 36.9 C (98.4 F) Pulse:  [84-102] 101 Resp:  [17-20] 18 BP: (89-172)/(56-108) 134/68 MAP (mmHg):  [78-93] 84 SpO2:  [96 %-100 %] 96 %  Intake/Output last 24 hours:  Intake/Output Summary (Last 24 hours) at 05/01/2024 0802 Last data filed at 05/01/2024 0600 Gross per 24 hour  Intake 545.4 ml  Output 2000 ml  Net -1454.6 ml    Physical Exam General: Cooperative, no distress, well appearing Neurologic: Alert and interactive Pulmonary: Normal work of breathing on room air Cardiovascular: Regular rate Musculoskeletal: Moves all extremities. Stump dressed with Xeroform, gauze, Kerlix, and Ace bandage. No strikethrough. -----------------------------------------------------  Data Review: All lab results last 24 hours:   Recent Results (from the past 24 hours)  POCT Glucose   Collection Time: 04/30/24  1:53 PM  Result Value Ref Range   Glucose, POC 284 (H) 70 - 179 mg/dL  POCT Glucose   Collection Time: 04/30/24  7:33 PM  Result Value Ref Range   Glucose, POC 156 70 - 179 mg/dL  POCT Glucose   Collection Time: 04/30/24 10:20 PM  Result Value Ref Range  Glucose, POC 182 (H) 70 - 179 mg/dL  CBC   Collection Time: 05/01/24  5:55 AM  Result Value Ref Range   WBC 10.1 3.6 - 11.2 10*9/L   RBC 3.43 (L) 4.26 - 5.60 10*12/L   HGB 9.1 (L) 12.9 - 16.5 g/dL   HCT 71.2 (L) 60.9 - 51.9 %   MCV 83.7 77.6 -  95.7 fL   MCH 26.5 25.9 - 32.4 pg   MCHC 31.7 (L) 32.0 - 36.0 g/dL   RDW 79.6 (H) 87.7 - 84.7 %   MPV 8.6 6.8 - 10.7 fL   Platelet 364 150 - 450 10*9/L  Basic Metabolic Panel   Collection Time: 05/01/24  5:55 AM  Result Value Ref Range   Sodium 135 135 - 145 mmol/L   Potassium 4.3 3.4 - 4.8 mmol/L   Chloride 93 (L) 98 - 107 mmol/L   CO2 24.0 20.0 - 31.0 mmol/L   Anion Gap 18 (H) 5 - 14 mmol/L   BUN 14 9 - 23 mg/dL   Creatinine 4.78 (H) 9.26 - 1.18 mg/dL   BUN/Creatinine Ratio 3    eGFR CKD-EPI (2021) Male 13 (L) >=60 mL/min/1.49m2   Glucose 334 (H) 70 - 179 mg/dL   Calcium  10.1 8.7 - 10.4 mg/dL  Magnesium Level   Collection Time: 05/01/24  5:55 AM  Result Value Ref Range   Magnesium 1.9 1.6 - 2.6 mg/dL  Phosphorus Level   Collection Time: 05/01/24  5:55 AM  Result Value Ref Range   Phosphorus 4.5 2.4 - 5.1 mg/dL    Imaging: Studies were reviewed by vascular surgery team No results found.

## 2024-05-01 NOTE — Consults (Signed)
 Endocrine Team Diabetes Follow Up Consult Note   Consult information: Requesting Attending Physician : Lawayne Patience, MD Service Requesting Consult : Surg Vascular (SRV) Primary Care Provider: [name], DO Impression: Raymond Lamb is a 38 y.o. male admitted for worsening foot wound . We have been consulted at the request of Lawayne Patience, MD to evaluate Raymond Lamb for hyperglycemia.   Medical Decision Making: Diagnoses: 1.Type 1 Diabetes. Uncontrolled With severe hypoglycemia last 24 hours.  2. Nutrition: Complicating glycemic control. Increasing risk for both hypoglycemia and hyperglycemia. 3. Infection. Complicating glycemic control and increasing risk for hyperglycemia. 4. End Stage Renal Disease. Complicating glycemic control and increasing risk for hypoglycemia. 5. Coronary Artery Disease. Complicating glycemic control and increasing risk for hyperglycemia and hypoglycemia. 6. Peripheral Vascular Disease. Complicating glycemic control and increasing risk for hyperglycemia.   Studies reviewed 05/01/24: Labs: CBC, BMP, POCT-BG, and HbA1C Interpretation: +Leukocytosis. Anemia noted. Normal potassium. Elevated Cr with reduced GFR in setting of known ESRD. Hyperglycemia with severe. A1C 9.4% (Outside records) indicates poor control outpatient. Notes reviewed: Primary team and nursing notes   Overall impression based on above reviews and history: Hypoglycemia in patient with known type 1 diabetes complicated by acute illness. Ran out of insulin  last night and was placed on MDI.  Once stable and has all pump supplies will assist with getting back on pump. States he prefers to stay on MDI currently.  Severe hyperglycemia after missed meal dose yesterday, but severely hyperglycemic today as well. Will increase basal and nutrititional by a unit, revise IS, and revise to consistent carb diet.   Recommendations: -Lantus  8 units daily -Lispro 3 units with meals   - lispro 2u x1 STAT (approx 1400) -lispro 2u TID prn snacks -Lispro correction target 140, ISF 40 achs and 0300 - Hypoglycemia protocol. - POCT-BG 5 times a day. - Ensure patient is on glucose precautions if patient taking nutrition by mouth.     Discharge planning: In process. Will complete actual plan closer to discharge.  Will discharge on home pump.  Thank you for this consult. Discussed plan with primary team. We will continue to follow and make recommendations and place orders as appropriate.  Please page with questions or concerns: April Goley, NP: [phone] DCT on call from 6AM - 3PM on weekdays then endocrine fellow on call: [phone] from 3PM - 6AM on weekdays and on weekends and holidays.  If APP cannot be reached, please page the endocrine fellow on call.   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 C/o pain at time of rounds, but ok.   Initial HPI: Raymond Lamb is a 38 y.o. male with past medical history of peripheral arterial disease, LUE DVT, T1DM, ESRD (on HD M/W/F), HTN, anoxic brain injury 05/2015, and NSTEMI  admitted for worsening foot wound.   Diabetes History: Patient has a history of Type 1 diabetes. Diabetes is managed by: Dr. Carlin at [facility]. Current home diabetes regimen:  OmniPod 5 in auto mode Basal 0.35 ICR 1 (does not carb count. Estimates units needed per meal)  ISF 80 Target 130   If pump failure: Lantus  8 units daily Lispro ~3 units with meals  ISF 1:50   Current home blood glucose monitoring dexcom G7. Hypoglycemia awareness: Unclear. Has a history of severe hypo requiring EMS and glucagon. Complications related to diabetes: retinopathy and ESRD on dialysis   Current Nutrition: Active Orders  Diet   Nutrition Therapy Regular/House    ROS: As per HPI.  Scheduled Medications[1]  Current  Outpatient Medications  Medication Instructions  . alcohol swabs PadM 1 each, Topical (Top), 4 times a day  . aspirin  (ECOTRIN) 81  mg, Oral, Daily (standard)  . atorvastatin  (LIPITOR ) 80 mg, Oral, Daily (standard)  . blood-glucose meter,continuous (DEXCOM G6 RECEIVER) Misc Dispense 1 Dexcom G6 receiver  . blood-glucose meter,continuous (DEXCOM G7 RECEIVER) Misc Use as instructed with G7 sensors  . blood-glucose sensor (DEXCOM G6 SENSOR) Devi Dispense 1 Dexcom G6 sensor every 10 days  . blood-glucose transmitter (DEXCOM G6 TRANSMITTER) Devi Dispense 1 Dexcom G6 transmitter every 90 days  . cholecalciferol, vitamin D3-50 mcg, 2,000 unit,, 50 mcg (2,000 unit) tablet 1 tablet, Daily (standard)  . cinacalcet  (SENSIPAR ) 90 mg, Oral, Daily (standard)  . clopidogrel  (PLAVIX ) 75 mg, Oral, Daily (standard)  . ezetimibe  (ZETIA ) 10 mg, Oral, Daily (standard)  . ferric citrate  (AURYXIA ) 210 mg iron  Tab tablet Take two tablets by mouth with meals, three times a day. Swallow whole.  Do not chew or crush medication.  . gabapentin  (NEURONTIN ) 600 mg, Oral, Nightly  . glucagon spray 3 mg/actuation Spry Use 1 spray in 1 nostril for severe hypoglycemia, as per package instructions  . HumaLOG KwikPen Insulin  10 Units, Subcutaneous, 3 times a day (AC)  . insulin  lispro (HUMALOG) 100 unit/mL injection Vials to be used in the pump to allow up to 50 units per day.  . insulin  pmp cart,aut,G6/7,cntr (OMNIPOD 5 G6-G7 INTRO KT,GEN5,) Crtg INTRO KIT CONTAIN CONTROLLER AND PODS, TO ALLOW POD CHANGE EVERY 2 DAYS  . insulin  pump cart,auto,BT,G6/7 (OMNIPOD 5 G6-G7 PODS, GEN 5,) Crtg Change pod every 2 days. Pod must be compatible with Dexcom G 6 and G 7.  . insulin  syringe-needle U-100 0.5 mL 31 gauge x 5/16 (8 mm) Syrg Use with insulin  Vials, 4 times per day before each meal or as needed to correct sugar.  . ipratropium (ATROVENT ) 42 mcg (0.06 %) nasal spray 2 sprays, Each Nare, 4 times a day  . isosorbide  mononitrate (IMDUR ) 30 mg, Oral, Daily (standard)  . lancets Misc use to test blood sugar Four (4) times a day with a meal and nightly.  . lancets Misc  Use to check blood sugar, up to 5 times a day  . lidocaine  2 % Soln 15 mL, Oral, Every 3 hours PRN  . methocarbamol  (ROBAXIN ) 1,000 mg, Oral, 2 times a day (standard)  . metoPROLOL  succinate (TOPROL  XL) 100 mg, Oral, Daily (standard)  . mirtazapine  (REMERON ) 30 mg, Oral, Nightly  . naloxone (NARCAN) 4 mg, Alternating Nares, Once as needed, One spray in either nostril once for known/suspected opioid overdose. May repeat every 2-3 minutes in alternating nostril til EMS arrives  . OMNIPOD 5 PACK PODS Use 1 Pod every 3 days  . ondansetron  (ZOFRAN -ODT) 4 mg, orally disintegrating tablet, Every 6 hours PRN  . oxyCODONE  (ROXICODONE ) 5 mg, Oral, Every 4 hours PRN  . pantoprazole  (PROTONIX ) 40 mg, Oral, Daily before breakfast  . patiromer  calcium  sorbitex (VELTASSA ) 8.4 gram powder packet Take 1 packet daily on non-dialysis days only  . pen needle, diabetic (UNIFINE PENTIPS) 32 gauge x 5/32 (4 mm) Ndle Use to give insulin  5 times a day  . predniSONE (DELTASONE) 50 mg, Oral, Take as directed, Give 13 hours, 7 hours and 1 hour before contrast procedure.  . sertraline  (ZOLOFT ) 150 mg, Oral, Daily (standard)  . sucralfate (CARAFATE) 1 g, Oral, 4 times a day PRN  . tizanidine (ZANAFLEX) 2 MG tablet TAKE 2 TABLETS BY MOUTH TWICE DAILY AS NEEDED  FOR MUSCLE SPASM  . triamcinolone (KENALOG) 0.1 % ointment Topical, 2 times a day (standard), Put on the scabby areas on the legs      Past Medical History[2]  Past Surgical History[3]  Family History[4]  Short Social History[5]  OBJECTIVE: BP 134/68   Pulse 101   Temp 36.9 C (98.4 F) (Oral)   Resp 18   SpO2 96%  Wt Readings from Last 12 Encounters:  04/04/24 70.3 kg (155 lb)  03/26/24 70.3 kg (155 lb)  02/21/24 68 kg (150 lb)  01/29/24 66.2 kg (146 lb)  01/21/24 66.2 kg (146 lb)  01/15/24 (P) 65.3 kg (144 lb)  12/19/23 65.3 kg (144 lb)  12/06/23 66 kg (145 lb 8.1 oz)  10/16/23 65.8 kg (145 lb)  09/21/23 65.8 kg (145 lb)  09/14/23 66 kg (145  lb 9.6 oz)  08/28/23 67.1 kg (148 lb)   Physical Exam Vitals and nursing note reviewed.  Constitutional:      Appearance: He is not ill-appearing.  Skin:    General: Skin is warm and dry.  Neurological:     General: No focal deficit present.     Mental Status: He is alert and oriented to person, place, and time.       BG/insulin  reviewed per EMR.  Glucose, POC (mg/dL)  Date Value  88/94/7974 182 (H)  04/30/2024 156  04/30/2024 284 (H)  04/29/2024 179  04/29/2024 123  04/29/2024 118  04/29/2024 117  04/29/2024 101     Summary of labs: Lab Results  Component Value Date   A1C 7.6 (H) 12/11/2023   A1C 8.6 (H) 09/10/2023   A1C 7.8 (H) 06/01/2023   Lab Results  Component Value Date   CREATININE 8.69 (H) 04/30/2024   Lab Results  Component Value Date   WBC 10.1 05/01/2024   HGB 9.1 (L) 05/01/2024   HCT 28.7 (L) 05/01/2024   PLT 364 05/01/2024    Lab Results  Component Value Date   NA 138 04/30/2024   K 4.1 04/30/2024   CL 95 (L) 04/30/2024   CO2 27.0 04/30/2024   BUN 38 (H) 04/30/2024   CREATININE 8.69 (H) 04/30/2024   GLU 176 04/30/2024   CALCIUM  9.5 04/30/2024   MG 1.9 04/30/2024   PHOS 6.5 (H) 04/30/2024    Lab Results  Component Value Date   BILITOT <0.2 (L) 04/27/2024   BILIDIR <0.10 11/20/2020   PROT 8.5 (H) 04/27/2024   ALBUMIN 3.2 (L) 04/27/2024   ALT 19 04/27/2024   AST 30 04/27/2024   ALKPHOS 94 04/27/2024   GGT 20 09/10/2017                [1] . acetaminophen   1,000 mg Oral Q8H  . aspirin   81 mg Oral Daily  . atorvastatin   80 mg Oral Daily  . senna  1 tablet Oral Daily   And  . docusate sodium   100 mg Oral Daily  . ezetimibe   10 mg Oral Daily  . gabapentin   600 mg Oral Nightly  . heparin  (porcine) for subcutaneous use  5,000 Units Subcutaneous Q8H SCH  . HYDROmorphone   0.5 mg Intravenous Once  . insulin  glargine  7 Units Subcutaneous Nightly  . insulin  lispro  2 Units Subcutaneous 3xd Meals  . insulin  lispro  0-20  Units Subcutaneous ACHS  . isosorbide  mononitrate  30 mg Oral Daily  . metoPROLOL  tartrate  50 mg Oral BID  . mirtazapine   30 mg Oral Nightly  . pantoprazole   40  mg Oral Daily before breakfast  . sertraline   150 mg Oral Daily  . sevelamer  800 mg Oral 3xd Meals  [2] Past Medical History: Diagnosis Date  . Atrial fibrillation    (CMS-HCC) 12/13/2020   ECG 06-16-2015 noted Atrial Fib with RVR noted during ED to Adm after PEA arrest.  . Cardiac arrest (CMS-HCC) 05/2015  . Cardiomyopathy (CMS-HCC)   . DKA (diabetic ketoacidoses) 04/20/2016  . DVT (deep venous thrombosis) (CMS-HCC)   . ESRD (end stage renal disease) on dialysis    (CMS-HCC)    T Th Sat  . GERD (gastroesophageal reflux disease) 08/03/2021  . Hemiplegia and hemiparesis following cerebral infarction affecting left non-dominant side    (CMS-HCC) 08/08/2022  . Hypertension   . Myocardial infarction (CMS-HCC)   . Peripheral arterial occlusive disease   . 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 Stroke    (CMS-HCC) 05/2015   left sided weakness, gets around with a wheelchair since stroke  . Type 1 diabetes (CMS-HCC)    Dignosed age 62. 2 episodes of DKA in past  . 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 AV FISTULA REPAIR  01/06/2019   Ballon angioplasty of stenotic basillic vein  . NASAL FRACTURE SURGERY     as teenager  . PR AMPUTATION FOOT,TRANSMETATARSAL Left 03/23/2016   Procedure: AMPUTATION, FOOT; TRANSMETATARSAL;  Surgeon: Selinda Bernardino Baston, MD;  Location: MAIN OR [facility];  Service: Vascular  . PR AMPUTATION METATARSAL+TOE,SINGLE Left 01/14/2016   Procedure: AMPUTATION, METATARSAL, WITH TOE SINGLE. L 4th toe;  Surgeon: Lawayne Patience, MD;  Location: MAIN OR [facility];  Service: Vascular  . PR AV ANAST,FOREARM VEIN TRANSPOSITION Left 05/08/2016   Procedure: Left Radial Cephalic fisutal vs Brachial Cephalic Fistula;  Surgeon: Lawayne Patience, MD;  Location: MAIN  [facility];  Service: Vascular  . PR CATH PLACE/CORON ANGIO, IMG SUPER/INTERP,W LEFT HEART VENTRICULOGRAPHY N/A 12/21/2015   Procedure: Left Heart Catheterization;  Surgeon: Suella Finders, MD;  Location: [facility] CATH;  Service: Cardiology  . PR CATH PLACE/CORON ANGIO, IMG SUPER/INTERP,W LEFT HEART VENTRICULOGRAPHY N/A 11/18/2020   Procedure: Left Heart Catheterization;  Surgeon: Norleen Deward Grumbling, MD;  Location: [facility] CATH;  Service: Cardiology  . PR DEBRIDEMENT BONE 1ST 20 SQ CM/< Left 03/23/2016   Procedure: DEBRIDEMENT; SKIN, SUBCUTANEOUS TISSUE, MUSCLE, & BONE FOOT;  Surgeon: Selinda Bernardino Baston, MD;  Location: MAIN OR [facility];  Service: Vascular  . PR EXPLORATION N/FLWD SURG LOWER EXTREMITY ARTERY Left 03/26/2024   Procedure: EXPLORATION NOT FOLLOWED BY SURGICAL REPAIR, ARTERY; LOWER EXTREMITY (EG, COMMON FEMORAL, DEEP FEMORAL, SUPERFICIAL FEMORAL, POPLITEAL, TIBIAL, PERONEAL);  Surgeon: Patience Lawayne, MD;  Location: OR [facility];  Service: Vascular  . PR VEIN BYPASS GRAFT,FEM-TIBIAL Left 01/03/2016   Procedure: Fem-Peroneal Bypass with Ipsilateral GSV;  Surgeon: Lawayne Patience, MD;  Location: MAIN OR [facility];  Service: Vascular  . RSFA stent    [4] Family History Problem Relation Age of Onset  . Cancer Paternal Uncle        brain  . Diabetes Maternal Grandmother   . Hypertension Maternal Grandmother   . Diabetes Paternal Grandmother   . Hypertension Paternal Grandmother   . Heart disease Paternal Grandfather   . COPD Paternal Grandfather        Lung cancer  . Kidney disease Other        multiple family members  . Hypertension Other        multiple family members  . Diabetes type I Other  on both sides of family  . Lung cancer Other        grandmother  . Clotting disorder Neg Hx   . Anesthesia problems Neg Hx   . Glaucoma Neg Hx   . Macular degeneration Neg Hx   . Bleeding Disorder Neg Hx   [5] Social History Tobacco Use  . Smoking status: Some Days    Current packs/day: 0.20     Types: Cigars, Cigarettes    Start date: 2019    Passive exposure: Past  . Smokeless tobacco: Never  . Tobacco comments:    Smoking 2 1/2 black and mild cigars a day  Vaping Use  . Vaping status: Former  Substance Use Topics  . Alcohol use: No    Alcohol/week: 0.0 standard drinks of alcohol    Comment: socially, very seldom  . Drug use: Yes    Types: Marijuana

## 2024-05-01 NOTE — Consults (Signed)
 Physical Medicine and Rehabilitation Inpatient Consult Note  Requesting Attending Physician: Lawayne Patience, MD Service Requesting Consult: Surg Vascular (SRV) Thank you for this consult.  Please contact the PM&R consult pager [phone]) for questions regarding these recommendations.  For questions of bed availability at [facility], contact the Intake Office at [phone]. Please contact your case manager for updates on AIR process as they are in regular contact with the rehab intake office.  ASSESSMENT:  Raymond Lamb is a 38 y.o. 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  RECOMMENDATIONS: Spasticity: secondary to right pontine ischemic stroke (2016). Outpatient medications include tizanidine 2mg  daily PRN. Follows with PM&R (Dr. Asencion) outpatient and receives botulinum toxin injections for spasticity management, last received 01/15/24. - recommend resume home tizanidine 2mg  daily PRN  Risk for constipation: 2/2 decreased mobility and opioid pain medications - can continue current doses of senna 1 tab daily and colace 100mg  daily, with low threshold to increase  Phantom Limb Pain: - Discussed desensitization techniques such as mirror therapy, tapping or massaging the residual limb to assess with pain control  - continue gabapentin  600mg  nightly  Residual Limb Care: - appreciate Prosthetics and Orthotics involvement for limb guard and/or shrinkers.  Pre-amputation Counseling: - Counseled patient about amputation process and life after amputation including process of getting a prosthesis  DISCHARGE RECOMMENDATIONS: - Recommendation: ACUTE INPATIENT REHABILITATION (AIR). -  Patient/family agreed to [facility] AIR. - Patient will need to be off PCA for pain management in order to be a candidate for AIR. Will continue to follow for medical and functional updates while in house  FOLLOW UP: - Please arrange Ambulatory PM&R Amputee Clinic Follow up with Dr. Randine Mt, Dr. Elsie Handler or Dr. Norleen Ozell Asencion at [facility] for [facility] [place], [place], phone: [phone])   AIR PLANNING: *This PM&R consult does not guarantee that patient has been accepted to [facility] AIR, but can be helpful to guide patient progression*  Checklist For Potential Admission to [facility] AIR: [ ]  EDD/what is estimated discharge date? [ ]  Referral from CM if wants [facility] AIR [ ]  PT/OT within 48 hours of AIR [ ]  dispo verified and [facility] AIR preference [ ]  CBC/BMP & other pertinent labs within 48 hours of AIR [ ]  Prefer oral pain meds only, but if on IV pain meds, then no more than q 6-8 hours prn [ ]  If on dialysis, must complete HD in a dialysis recliner and be transitioned to a MWF schedule prior to AIR  Discharge Plan After AIR: - patient lives in a 1 level home with 0 STE  In order for the patient to be considered for admission to [facility] inpatient rehab, the case manager must give the patient / family choice in post-acute discharge options. If the patient desires [facility], a referral from case management is required for acceptance to [facility] inpatient rehab.  A referral for [facility] inpatient rehab has not been received.   ______________________________________________ Thank you for involving us  in the care of this patient.   Kaitlyn DeHority, MD [facility] Physical Medicine & Rehabilitation   MEDICAL DECISION MAKING (level of service defined by 2/3 elements)   Number/Complexity of Problems Addressed --HIGH (99205/99215)--  1 acute or chronic illness or injury that poses a threat to life or bodily function (99205/99215)  Amount/Complexity of Data to be Reviewed/Analyzed 3 points:  Review prior notes (1 point per unique source); Review test results (1 point per unique test); Order tests (1 point per unique test); Assessment requiring an independent historian (99204/99214) Discussion of management or test interpretation with external physician/other qualified health care professional/appropriate source (99204/99214) --EXTENSIVE (99205/99215)-- Meets at least 2 of the 3 MODERATE criteria above (99205/99215)  Risk of Complications/Morbidity/Mortality of Management --HIGH Risk of Morbidity from Additional Diagnostic Testing or Treatment (99205/99215)--   TIME   Total Time for E/M Services on the Date of Encounter Time-based coding not utilized for this encounter   Reviewed labs, endocrinology/nephrology/P&O consult notes, discussion of management with primary team.    SUBJECTIVE  Reason for Consult: Patient seen in consultation at the request of Lawayne Patience, MD for evaluation of rehabilitation needs and recommendations.  History of Present Illness: Raymond Lamb is a 38 y.o. 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  Medical Complexity: #h/o PAD, gangrene s/p L AKA 11/4 - ASA, statin, plavix  - limbguard delivered by P&O - PT/OT  #Acute Pain - PCA - oxycodone  5-10mg  q4h PRN - tylenol  1g TID - robaxin  500mg  QID - gabapentin  600mg  nightly  #h/o NSTEMI: - imdur , statin, metoprolol   #ESRD on HD - HD MWF - sevelamer  #T1DM: has insulin  pump that is currently not functioning. Endocrinology has been following -Lantus  7 units daily -Lispro 2 units with meals  -Lispro correction target 140, ISF 50 achs - Hypoglycemia protocol. - POCT-BG achs. - Ensure patient is on glucose  precautions if patient taking nutrition by mouth.   #risk for constipation: decreased mobility and opioid pain medications are contributing factors - senna 1 tab daily - colace 100mg  daily  #Spasticity: secondary to right pontine ischemic stroke (2016). Outpatient medications include tizanidine 2mg  daily PRN. Follows with PM&R (Dr. Asencion) outpatient and receives botulinum toxin injections for spasticity management, last received 01/15/24.  #VTE Risk - heparin   # Impaired ADLs, fall risk, impaired functional gait, mobility, balance, and endurance - Please continue to have patient work with PT and OT to maximize functional status with mobility and ADLs. - Fall risk precautions and OOB to chair daily with assistance if appropriate - Discussed rehab options today with patient. The patient/family/caregiver was counseled and educated on the purpose of AIR and questions/concerns were addressed.   Today Patient reports that pain is present in residual limb along medial compartment. Has some phantom limb sensations but not phantom limb pain. Reports that appetite has been poor since surgery and he has nausea. Denies vomiting or abdominal pain. Denies chest pain. Is motivated to work with therapy. Limbguard delivered. Getting HD MWF  Past Medical and Surgical History:  Past Medical History[1] Past Surgical History[2]   Cancer History: Hematology/Oncology History   No problem history exists.    Social History:  Short Social History[3]  Social History   Social History Narrative  . Not on file    Living Environment: House Lives With: Mother Home Living: Level entry, Tub/shower unit, Standard height toilet, Grab bars in shower, Shower chair with back, One level home Caregiver Identified?: Yes (mother (former  CNA)) Caregiver Availability: 24 hours Caregiver Ability: Limited lifting  Family History: Reviewed, noncontributory to rehab family history includes COPD in his paternal  grandfather; Cancer in his paternal uncle; Diabetes in his maternal grandmother and paternal grandmother; Diabetes type I in an other family member; Heart disease in his paternal grandfather; Hypertension in his maternal grandmother, paternal grandmother, and another family member; Kidney disease in an other family member; Lung cancer in an other family member.  Allergies:  Baclofen, Camellia oleifera seed oil, Iodinated contrast media, Lisinopril, and Oxybenzone(benzophenone 3)  Medications:  Scheduled Scheduled meds with Route[4] PRN dextrose  in water, 12.5 g, Q15 Min PRN glucagon, 1 mg, Once PRN glucose, 16 g, Q10 Min PRN melatonin, 3 mg, Nightly PRN naloxone, 0.4 mg, Q5 Min PRN ondansetron , 4 mg, Q8H PRN oxyCODONE , 5 mg, Q4H PRN  Or oxyCODONE , 10 mg, Q4H PRN prochlorperazine, 2.5 mg, Q6H PRN   Continuous Infusions HYDROmorphone  in 0.9 % NaCL [Provider Hold] patient's own insulin  pump, Last Rate: Stopped (04/28/24 1931)    Review of Systems:   General ROS: negative Psychological ROS: negative Ophthalmic ROS: negative Respiratory ROS: no cough, shortness of breath, or wheezing Cardiovascular ROS: no chest pain or dyspnea on exertion Gastrointestinal ROS: positive for - nausea, loss of appetite Genito-Urinary ROS: negative Musculoskeletal ROS: positive for - muscle pain Neurological ROS: positive for spasticity   Prior Functional Status: Prior Functional Status: Pt reports being mod I with functional mobility PTA. Pt primarily performs seated pivot transfers to power w/c. Per chart review, he has been non ambulatory since 2018. He reports having multiple falls in the past 6 months, able to return upright indep with some falls, but reports needing assistance with returning upright at times.  Current Functional Status: Therapy notes reviewed   Mobility:  Bed Mobility comments: sup <> sit with HOB elevated SBA     Skilled Treatment Performed: not assessed PT Post Acute  Discharge Recommendations: 5x weekly, High intensity  Cognition, Swallow, Speech:           Assistive Devices: Wheelchair-manual, Wheelchair-power (TTB)  Precautions: Safety Interventions Safety Interventions: low bed  OBJECTIVE:  Vitals: Vitals:   04/30/24 2015 05/01/24 0000 05/01/24 0600 05/01/24 0930  BP: 131/77 134/57 134/68 145/75  Pulse: 96 102 101 102  Resp: 18 17 18 17   Temp: 36.8 C (98.2 F) 37.9 C (100.2 F) 36.9 C (98.4 F) 37.5 C (99.5 F)  TempSrc: Oral Oral Oral Oral  SpO2: 98% 96% 96% 97%    Physical Exam:   GEN: Lying in bed in NAD. HEENT: Atraumatic. Normocephalic. Moist mucous membranes. Trachea midline. RESP: NWOB on RA. CV: Regular rate and rhythm. GI: nondistended PSYCH: Appropriate SKIN: see wound care flow sheet MSK: left transfemoral amputation, residual limb wrapped in dressing, no drainage strikethrough on bandage.  NEURO: Mental Status: awake, alert, attention intact, speech fluid and coherent, follows commands well and answers questions appropriately  Motor:  Right Left  C5 (elbow flexors) 5 4+  C6 (wrist extensors) 5 *limited by spasticity  C7 (elbow extensors) 5 4- w/in available range  C8 (finger flexors - DIP) 5 NT  T1 (finger abduction - ADQ) 4 NT      L2 (hip flexors) 5 *limited by pain  L3 (knee extensors) 5   L4 (ankle DF) 5   L5 (EHL) 5   S1 (ankle PF) 5     Spasticity - Modified Ashworth Scale: Tone (Modified Ashworth Scale)  Left  Shoulder adductors   Elbow flexors 2  Elbow extensors   Forearm supinators 2  Forearm pronators   Wrist flexors 3  Wrist extensors   Finger flexors (PIP) 3    Modified Ashworth Scale  0 No increase in muscle tone   1 Slight increase in muscle tone, manifested by a catch and release or by minimal resistance at the end of the range of motion when the affected part(s) is moved in flexion or extension   1+ Slight increase in muscle tone, manifested by a catch, followed by  minimal resistance throughout the remainder (less than half) of the ROM   2 More marked increase in muscle tone through most of the ROM, but affected part(s) easily moved   3 Considerable increase in muscle tone, passive movement difficult   4 Affected part(s) rigid in flexion or extension      Labs and Diagnostic Studies:   Personally reviewed  CBC - Results in Past 30 Days Result Component Current Result Ref Range Previous Result Ref Range  HCT 28.7 (L) (05/01/2024) 39.0 - 48.0 % 29.7 (L) (04/30/2024) 39.0 - 48.0 %  HGB 9.1 (L) (05/01/2024) 12.9 - 16.5 g/dL 9.4 (L) (88/09/7972) 87.0 - 16.5 g/dL  MCH 73.4 (88/08/7972) 74.0 - 32.4 pg 26.3 (04/30/2024) 25.9 - 32.4 pg  MCHC 31.7 (L) (05/01/2024) 32.0 - 36.0 g/dL 68.3 (L) (88/09/7972) 67.9 - 36.0 g/dL  MCV 16.2 (88/08/7972) 22.3 - 95.7 fL 83.2 (04/30/2024) 77.6 - 95.7 fL  MPV 8.6 (05/01/2024) 6.8 - 10.7 fL 8.3 (04/30/2024) 6.8 - 10.7 fL  Platelet 364 (05/01/2024) 150 - 450 10*9/L 330 (04/30/2024) 150 - 450 10*9/L  RBC 3.43 (L) (05/01/2024) 4.26 - 5.60 10*12/L 3.57 (L) (04/30/2024) 4.26 - 5.60 10*12/L  WBC 10.1 (05/01/2024) 3.6 - 11.2 10*9/L 11.5 (H) (04/30/2024) 3.6 - 11.2 10*9/L   BMP - Results in Past 30 Days Result Component Current Result Ref Range Previous Result Ref Range  BUN 14 (05/01/2024) 9 - 23 mg/dL 38 (H) (88/09/7972) 9 - 23 mg/dL  Chloride 93 (L) (88/08/7972) 98 - 107 mmol/L 95 (L) (04/30/2024) 98 - 107 mmol/L  CO2 24.0 (05/01/2024) 20.0 - 31.0 mmol/L 27.0 (04/30/2024) 20.0 - 31.0 mmol/L  Creatinine 5.21 (H) (05/01/2024) 0.73 - 1.18 mg/dL 1.30 (H) (88/09/7972) 9.26 - 1.18 mg/dL  Glucose 665 (H) (88/08/7972) 70 - 179 mg/dL 823 (88/09/7972) 70 - 820 mg/dL  Potassium 4.3 (88/08/7972) 3.4 - 4.8 mmol/L 4.1 (04/30/2024) 3.4 - 4.8 mmol/L  Sodium 135 (05/01/2024) 135 - 145 mmol/L 138 (04/30/2024) 135 - 145 mmol/L   LFT's - Results in Past 30 Days Result Component Current Result Ref Range Previous Result Ref Range  Albumin 3.2 (L) (04/27/2024) 3.4 - 5.0 g/dL Not  in Time Range   Alkaline Phosphatase 94 (04/27/2024) 46 - 116 U/L Not in Time Range   ALT 19 (04/27/2024) 10 - 49 U/L Not in Time Range   AST 30 (04/27/2024) <=34 U/L Not in Time Range   Total Bilirubin <0.2 (L) (04/27/2024) 0.3 - 1.2 mg/dL Not in Time Range     Radiology Results:   Personally reviewed  XR Femur 2 Views Left Result Date: 04/29/2024 EXAM: XR FEMUR 2 VIEWS LEFT DATE: 04/28/2024 7:04 PM ACCESSION: [id number] UN DICTATED: 04/29/2024 6:00 AM INTERPRETATION LOCATION: Main Campus   CLINICAL INDICATION: 38 years old Male with Surgical planning for AKA    COMPARISON: 04/27/2024   TECHNIQUE: AP and lateral views of the left femur.   FINDINGS/IMPRESSION: Unorthodox imaging views limits evaluation. Oblique proximal fibular shaft fracture is unchanged in alignment. Knee  and hip are approximated. No advanced degenerative changes. Scattered surgical clips at the leg.       This encounter was an inpatient consultation.        [1] Past Medical History: Diagnosis Date  . Atrial fibrillation    (CMS-HCC) 12/13/2020   ECG 06-16-2015 noted Atrial Fib with RVR noted during ED to Adm after PEA arrest.  . Cardiac arrest (CMS-HCC) 05/2015  . Cardiomyopathy (CMS-HCC)   . DKA (diabetic ketoacidoses) 04/20/2016  . DVT (deep venous thrombosis) (CMS-HCC)   . ESRD (end stage renal disease) on dialysis    (CMS-HCC)    T Th Sat  . GERD (gastroesophageal reflux disease) 08/03/2021  . Hemiplegia and hemiparesis following cerebral infarction affecting left non-dominant side    (CMS-HCC) 08/08/2022  . Hypertension   . Myocardial infarction (CMS-HCC)   . Peripheral arterial occlusive disease   . 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 Stroke    (CMS-HCC) 05/2015   left sided weakness, gets around with a wheelchair since stroke  . Type 1 diabetes (CMS-HCC)    Dignosed age 53. 2 episodes of DKA in past  . Zoster ophthalmicus 04/13/2020  [2] Past Surgical History: Procedure  Laterality Date  . AV FISTULA REPAIR  01/06/2019   Ballon angioplasty of stenotic basillic vein  . NASAL FRACTURE SURGERY     as teenager  . PR AMPUTATION FOOT,TRANSMETATARSAL Left 03/23/2016   Procedure: AMPUTATION, FOOT; TRANSMETATARSAL;  Surgeon: Selinda Bernardino Baston, MD;  Location: MAIN OR [facility];  Service: Vascular  . PR AMPUTATION METATARSAL+TOE,SINGLE Left 01/14/2016   Procedure: AMPUTATION, METATARSAL, WITH TOE SINGLE. L 4th toe;  Surgeon: Lawayne Patience, MD;  Location: MAIN OR [facility];  Service: Vascular  . PR AV ANAST,FOREARM VEIN TRANSPOSITION Left 05/08/2016   Procedure: Left Radial Cephalic fisutal vs Brachial Cephalic Fistula;  Surgeon: Lawayne Patience, MD;  Location: MAIN OR [facility];  Service: Vascular  . PR CATH PLACE/CORON ANGIO, IMG SUPER/INTERP,W LEFT HEART VENTRICULOGRAPHY N/A 12/21/2015   Procedure: Left Heart Catheterization;  Surgeon: Suella Finders, MD;  Location: [facility] CATH;  Service: Cardiology  . PR CATH PLACE/CORON ANGIO, IMG SUPER/INTERP,W LEFT HEART VENTRICULOGRAPHY N/A 11/18/2020   Procedure: Left Heart Catheterization;  Surgeon: Norleen Deward Grumbling, MD;  Location: [facility] CATH;  Service: Cardiology  . PR DEBRIDEMENT BONE 1ST 20 SQ CM/< Left 03/23/2016   Procedure: DEBRIDEMENT; SKIN, SUBCUTANEOUS TISSUE, MUSCLE, & BONE FOOT;  Surgeon: Selinda Bernardino Baston, MD;  Location: MAIN OR [facility];  Service: Vascular  . PR EXPLORATION N/FLWD SURG LOWER EXTREMITY ARTERY Left 03/26/2024   Procedure: EXPLORATION NOT FOLLOWED BY SURGICAL REPAIR, ARTERY; LOWER EXTREMITY (EG, COMMON FEMORAL, DEEP FEMORAL, SUPERFICIAL FEMORAL, POPLITEAL, TIBIAL, PERONEAL);  Surgeon: Patience Lawayne, MD;  Location: OR [facility];  Service: Vascular  . PR VEIN BYPASS GRAFT,FEM-TIBIAL Left 01/03/2016   Procedure: Fem-Peroneal Bypass with Ipsilateral GSV;  Surgeon: Lawayne Patience, MD;  Location: MAIN OR [facility];  Service: Vascular  . RSFA stent    [3] Social History Tobacco Use  . Smoking status: Some Days    Current packs/day:  0.20    Types: Cigars, Cigarettes    Start date: 2019    Passive exposure: Past  . Smokeless tobacco: Never  . Tobacco comments:    Smoking 2 1/2 black and mild cigars a day  Vaping Use  . Vaping status: Former  Substance Use Topics  . Alcohol use: No    Alcohol/week: 0.0 standard drinks of alcohol    Comment: socially, very seldom  . Drug  use: Yes    Types: Marijuana  [4] . acetaminophen  (TYLENOL ) tablet 1,000 mg Q8H  . aspirin  chewable tablet 81 mg Daily  . atorvastatin  (LIPITOR ) tablet 80 mg Daily  . clopidogrel  (PLAVIX ) tablet 75 mg Daily  . senna (SENOKOT) tablet 1 tablet Daily   And  . docusate sodium  (COLACE) capsule 100 mg Daily  . ezetimibe  (ZETIA ) tablet 10 mg Daily  . gabapentin  (NEURONTIN ) capsule 600 mg Nightly  . heparin  (porcine) 5,000 unit/mL injection 5,000 Units Q8H SCH  . HYDROmorphone  (DILAUDID ) injection Syrg 0.5 mg Once  . insulin  glargine (LANTUS ) injection BASAL 7 Units Nightly  . insulin  lispro (HumaLOG) injection 2 Units 3xd Meals  . insulin  lispro (HumaLOG) injection CORRECTIONAL 0-20 Units ACHS  . isosorbide  mononitrate (IMDUR ) 24 hr tablet 30 mg Daily  . metoPROLOL  tartrate (Lopressor ) tablet 50 mg BID  . mirtazapine  (REMERON ) tablet 30 mg Nightly  . pantoprazole  (Protonix ) EC tablet 40 mg Daily before breakfast  . sertraline  (ZOLOFT ) tablet 150 mg Daily  . sevelamer (RENVELA) tablet 800 mg 3xd Meals

## 2024-05-02 NOTE — Nursing Note (Signed)
   05/02/24 1352  Missed Session   Note Type Contact Note  Missed Therapy Reason HD  PT Missed Minutes 0

## 2024-05-02 NOTE — Consults (Signed)
 Endocrine Team Diabetes Follow Up Consult Note   Consult information: Requesting Attending Physician : Lawayne Patience, MD Service Requesting Consult : Surg Vascular (SRV) Primary Care Provider: [name], DO Impression: Raymond Lamb is a 38 y.o. male admitted for worsening foot wound . We have been consulted at the request of Lawayne Patience, MD to evaluate Raymond Lamb for hyperglycemia.   Medical Decision Making: Diagnoses: 1.Type 1 Diabetes. Uncontrolled With severe hypoglycemia last 24 hours.  2. Nutrition: Complicating glycemic control. Increasing risk for both hypoglycemia and hyperglycemia. 3. Infection. Complicating glycemic control and increasing risk for hyperglycemia. 4. End Stage Renal Disease. Complicating glycemic control and increasing risk for hypoglycemia. 5. Coronary Artery Disease. Complicating glycemic control and increasing risk for hyperglycemia and hypoglycemia. 6. Peripheral Vascular Disease. Complicating glycemic control and increasing risk for hyperglycemia.   Studies reviewed 05/02/24: Labs: CBC, BMP, POCT-BG, and HbA1C Interpretation: +Leukocytosis. Anemia noted. Normal potassium. Elevated Cr with reduced GFR in setting of known ESRD. Hyperglycemia with severe. A1C 9.4% (Outside records) indicates poor control outpatient. Notes reviewed: Primary team and nursing notes   Overall impression based on above reviews and history: Hypoglycemia in patient with known type 1 diabetes complicated by acute illness. Ran out of insulin  last night and was placed on MDI.  Once stable and has all pump supplies will assist with getting back on pump. States he prefers to stay on MDI currently.  Severe hyperglycemia yesterday which did not correct despite significant additional doses. Transferred to ICU and placed on endotool.  Is willing to return on insulin  pump which is to be brought in from home. Unfortunately, patient still has not been able to get his  pump late afternoon. Will place on subcutaneous insulin  and patient to inform us  if/when he gets his pump (11/8).   Recommendations: - discontinue IV insulin  infusion at 1900 (2 hours after lantus ) - lantus  8u every day (first dose 1700 to get off IV Insulin , then can hopefully resume pump tomorrow evening) - lispro 3u qAC - lispro 2u TID prn snacks - lispro target 140, ISF 40 achs and 0300 - Hypoglycemia protocol. - POCT-BG 5 times a day. - Ensure patient is on glucose precautions if patient taking nutrition by mouth.    In case of pump failure: Lantus  8 units daily Lispro 3 units with meals  ISF target 140, ISF 40 achs   Discharge planning: In process. Will complete actual plan closer to discharge.  Will discharge on home pump.  Thank you for this consult. Discussed plan with primary team. We will continue to follow and make recommendations and place orders as appropriate.  Please page with questions or concerns: April Goley, NP: [phone] DCT on call from 6AM - 3PM on weekdays then endocrine fellow on call: [phone] from 3PM - 6AM on weekdays and on weekends and holidays.  If APP cannot be reached, please page the endocrine fellow on call.   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 C/o pain at time of rounds, but ok. PO intake decent. To get iHD today.   Initial HPI: [name] is a 38 y.o. male with past medical history of peripheral arterial disease, LUE DVT, T1DM, ESRD (on HD M/W/F), HTN, anoxic brain injury 05/2015, and NSTEMI  admitted for worsening foot wound.   Diabetes History: Patient has a history of Type 1 diabetes. Diabetes is managed by: Dr. Carlin at [facility]. Current home diabetes regimen:  OmniPod 5 in auto mode Basal 0.35 ICR 1 (does not carb count. Estimates  units needed per meal)  ISF 80 Target 130   In case of pump failure: Lantus  8 units daily Lispro ~3 units with meals  ISF 1:50   Current home blood glucose monitoring dexcom  G7. Hypoglycemia awareness: Unclear. Has a history of severe hypo requiring EMS and glucagon. Complications related to diabetes: retinopathy and ESRD on dialysis   Current Nutrition: Active Orders  Diet   Nutrition Therapy Consistent Carb; Consistent Carb 60/60/60 (4/4/4)    ROS: As per HPI.  Scheduled Medications[1]  Current Outpatient Medications  Medication Instructions  . alcohol swabs PadM 1 each, Topical (Top), 4 times a day  . aspirin  (ECOTRIN) 81 mg, Oral, Daily (standard)  . atorvastatin  (LIPITOR ) 80 mg, Oral, Daily (standard)  . blood-glucose meter,continuous (DEXCOM G6 RECEIVER) Misc Dispense 1 Dexcom G6 receiver  . blood-glucose meter,continuous (DEXCOM G7 RECEIVER) Misc Use as instructed with G7 sensors  . blood-glucose sensor (DEXCOM G6 SENSOR) Devi Dispense 1 Dexcom G6 sensor every 10 days  . blood-glucose transmitter (DEXCOM G6 TRANSMITTER) Devi Dispense 1 Dexcom G6 transmitter every 90 days  . cholecalciferol, vitamin D3-50 mcg, 2,000 unit,, 50 mcg (2,000 unit) tablet 1 tablet, Daily (standard)  . cinacalcet  (SENSIPAR ) 90 mg, Oral, Daily (standard)  . clopidogrel  (PLAVIX ) 75 mg, Oral, Daily (standard)  . ezetimibe  (ZETIA ) 10 mg, Oral, Daily (standard)  . ferric citrate  (AURYXIA ) 210 mg iron  Tab tablet Take two tablets by mouth with meals, three times a day. Swallow whole.  Do not chew or crush medication.  . gabapentin  (NEURONTIN ) 600 mg, Oral, Nightly  . glucagon spray 3 mg/actuation Spry Use 1 spray in 1 nostril for severe hypoglycemia, as per package instructions  . HumaLOG KwikPen Insulin  10 Units, Subcutaneous, 3 times a day (AC)  . insulin  lispro (HUMALOG) 100 unit/mL injection Vials to be used in the pump to allow up to 50 units per day.  . insulin  pmp cart,aut,G6/7,cntr (OMNIPOD 5 G6-G7 INTRO KT,GEN5,) Crtg INTRO KIT CONTAIN CONTROLLER AND PODS, TO ALLOW POD CHANGE EVERY 2 DAYS  . insulin  pump cart,auto,BT,G6/7 (OMNIPOD 5 G6-G7 PODS, GEN 5,) Crtg Change pod  every 2 days. Pod must be compatible with Dexcom G 6 and G 7.  . insulin  syringe-needle U-100 0.5 mL 31 gauge x 5/16 (8 mm) Syrg Use with insulin  Vials, 4 times per day before each meal or as needed to correct sugar.  . ipratropium (ATROVENT ) 42 mcg (0.06 %) nasal spray 2 sprays, Each Nare, 4 times a day  . isosorbide  mononitrate (IMDUR ) 30 mg, Oral, Daily (standard)  . lancets Misc use to test blood sugar Four (4) times a day with a meal and nightly.  . lancets Misc Use to check blood sugar, up to 5 times a day  . lidocaine  2 % Soln 15 mL, Oral, Every 3 hours PRN  . methocarbamol  (ROBAXIN ) 1,000 mg, Oral, 2 times a day (standard)  . metoPROLOL  succinate (TOPROL  XL) 100 mg, Oral, Daily (standard)  . mirtazapine  (REMERON ) 30 mg, Oral, Nightly  . naloxone (NARCAN) 4 mg, Alternating Nares, Once as needed, One spray in either nostril once for known/suspected opioid overdose. May repeat every 2-3 minutes in alternating nostril til EMS arrives  . OMNIPOD 5 PACK PODS Use 1 Pod every 3 days  . ondansetron  (ZOFRAN -ODT) 4 mg, orally disintegrating tablet, Every 6 hours PRN  . oxyCODONE  (ROXICODONE ) 5 mg, Oral, Every 4 hours PRN  . pantoprazole  (PROTONIX ) 40 mg, Oral, Daily before breakfast  . patiromer  calcium  sorbitex (VELTASSA ) 8.4 gram powder  packet Take 1 packet daily on non-dialysis days only  . pen needle, diabetic (UNIFINE PENTIPS) 32 gauge x 5/32 (4 mm) Ndle Use to give insulin  5 times a day  . predniSONE (DELTASONE) 50 mg, Oral, Take as directed, Give 13 hours, 7 hours and 1 hour before contrast procedure.  . sertraline  (ZOLOFT ) 150 mg, Oral, Daily (standard)  . sucralfate (CARAFATE) 1 g, Oral, 4 times a day PRN  . tizanidine (ZANAFLEX) 2 MG tablet TAKE 2 TABLETS BY MOUTH TWICE DAILY AS NEEDED FOR MUSCLE SPASM  . triamcinolone (KENALOG) 0.1 % ointment Topical, 2 times a day (standard), Put on the scabby areas on the legs      Past Medical History[2]  Past Surgical History[3]  Family  History[4]  Short Social History[5]  OBJECTIVE: BP 105/55   Pulse 80   Temp 37.1 C (98.8 F) (Oral)   Resp 8   SpO2 99%  Wt Readings from Last 12 Encounters:  04/04/24 70.3 kg (155 lb)  03/26/24 70.3 kg (155 lb)  02/21/24 68 kg (150 lb)  01/29/24 66.2 kg (146 lb)  01/21/24 66.2 kg (146 lb)  01/15/24 (P) 65.3 kg (144 lb)  12/19/23 65.3 kg (144 lb)  12/06/23 66 kg (145 lb 8.1 oz)  10/16/23 65.8 kg (145 lb)  09/21/23 65.8 kg (145 lb)  09/14/23 66 kg (145 lb 9.6 oz)  08/28/23 67.1 kg (148 lb)   Physical Exam Vitals and nursing note reviewed.  Constitutional:      Appearance: He is not ill-appearing.  Skin:    General: Skin is warm and dry.  Neurological:     General: No focal deficit present.     Mental Status: He is alert and oriented to person, place, and time.       BG/insulin  reviewed per EMR.  Glucose, POC (mg/dL)  Date Value  88/92/7974 120  05/02/2024 134  05/02/2024 141  05/02/2024 150  05/02/2024 144  05/01/2024 136  05/01/2024 156  05/01/2024 242 (H)     Summary of labs: Lab Results  Component Value Date   A1C 7.6 (H) 12/11/2023   A1C 8.6 (H) 09/10/2023   A1C 7.8 (H) 06/01/2023   Lab Results  Component Value Date   CREATININE 7.15 (H) 05/02/2024   Lab Results  Component Value Date   WBC 11.4 (H) 05/02/2024   HGB 8.5 (L) 05/02/2024   HCT 27.8 (L) 05/02/2024   PLT 393 05/02/2024    Lab Results  Component Value Date   NA 135 05/02/2024   K 3.9 05/02/2024   CL 92 (L) 05/02/2024   CO2 29.0 05/02/2024   BUN 30 (H) 05/02/2024   CREATININE 7.15 (H) 05/02/2024   GLU 137 05/02/2024   CALCIUM  10.2 05/02/2024   MG 2.0 05/02/2024   PHOS 5.9 (H) 05/02/2024    Lab Results  Component Value Date   BILITOT <0.2 (L) 04/27/2024   BILIDIR <0.10 11/20/2020   PROT 8.5 (H) 04/27/2024   ALBUMIN 3.2 (L) 04/27/2024   ALT 19 04/27/2024   AST 30 04/27/2024   ALKPHOS 94 04/27/2024   GGT 20 09/10/2017                 [1] .  acetaminophen   1,000 mg Oral Q8H  . aspirin   81 mg Oral Daily  . atorvastatin   80 mg Oral Daily  . clopidogrel   75 mg Oral Daily  . senna  1 tablet Oral Daily   And  . docusate sodium   100 mg  Oral Daily  . ezetimibe   10 mg Oral Daily  . gabapentin   600 mg Oral Nightly  . heparin  (porcine) for subcutaneous use  5,000 Units Subcutaneous Q8H SCH  . HYDROmorphone   0.5 mg Intravenous Once  . isosorbide  mononitrate  30 mg Oral Daily  . methocarbamol   500 mg Oral QID  . metoPROLOL  tartrate  50 mg Oral BID  . mirtazapine   30 mg Oral Nightly  . pantoprazole   40 mg Oral Daily before breakfast  . sertraline   150 mg Oral Daily  . sevelamer  800 mg Oral 3xd Meals  [2] Past Medical History: Diagnosis Date  . Atrial fibrillation    (CMS-HCC) 12/13/2020   ECG 06-16-2015 noted Atrial Fib with RVR noted during ED to Adm after PEA arrest.  . Cardiac arrest (CMS-HCC) 05/2015  . Cardiomyopathy (CMS-HCC)   . DKA (diabetic ketoacidoses) 04/20/2016  . DVT (deep venous thrombosis) (CMS-HCC)   . ESRD (end stage renal disease) on dialysis    (CMS-HCC)    T Th Sat  . GERD (gastroesophageal reflux disease) 08/03/2021  . Hemiplegia and hemiparesis following cerebral infarction affecting left non-dominant side    (CMS-HCC) 08/08/2022  . Hypertension   . Myocardial infarction (CMS-HCC)   . Peripheral arterial occlusive disease   . 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 Stroke    (CMS-HCC) 05/2015   left sided weakness, gets around with a wheelchair since stroke  . Type 1 diabetes (CMS-HCC)    Dignosed age 62. 2 episodes of DKA in past  . 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 AV FISTULA REPAIR  01/06/2019   Ballon angioplasty of stenotic basillic vein  . NASAL FRACTURE SURGERY     as teenager  . PR AMPUTATION FOOT,TRANSMETATARSAL Left 03/23/2016   Procedure: AMPUTATION, FOOT; TRANSMETATARSAL;  Surgeon: Selinda Bernardino Baston, MD;  Location:  MAIN OR [facility];  Service: Vascular  . PR AMPUTATION METATARSAL+TOE,SINGLE Left 01/14/2016   Procedure: AMPUTATION, METATARSAL, WITH TOE SINGLE. L 4th toe;  Surgeon: Lawayne Patience, MD;  Location: MAIN OR [facility];  Service: Vascular  . PR AV ANAST,FOREARM VEIN TRANSPOSITION Left 05/08/2016   Procedure: Left Radial Cephalic fisutal vs Brachial Cephalic Fistula;  Surgeon: Lawayne Patience, MD;  Location: MAIN OR [facility];  Service: Vascular  . PR CATH PLACE/CORON ANGIO, IMG SUPER/INTERP,W LEFT HEART VENTRICULOGRAPHY N/A 12/21/2015   Procedure: Left Heart Catheterization;  Surgeon: Suella Finders, MD;  Location: [facility] CATH;  Service: Cardiology  . PR CATH PLACE/CORON ANGIO, IMG SUPER/INTERP,W LEFT HEART VENTRICULOGRAPHY N/A 11/18/2020   Procedure: Left Heart Catheterization;  Surgeon: Norleen Deward Grumbling, MD;  Location: [facility] CATH;  Service: Cardiology  . PR DEBRIDEMENT BONE 1ST 20 SQ CM/< Left 03/23/2016   Procedure: DEBRIDEMENT; SKIN, SUBCUTANEOUS TISSUE, MUSCLE, & BONE FOOT;  Surgeon: Selinda Bernardino Baston, MD;  Location: MAIN OR [facility];  Service: Vascular  . PR EXPLORATION N/FLWD SURG LOWER EXTREMITY ARTERY Left 03/26/2024   Procedure: EXPLORATION NOT FOLLOWED BY SURGICAL REPAIR, ARTERY; LOWER EXTREMITY (EG, COMMON FEMORAL, DEEP FEMORAL, SUPERFICIAL FEMORAL, POPLITEAL, TIBIAL, PERONEAL);  Surgeon: Patience Lawayne, MD;  Location: OR [facility];  Service: Vascular  . PR VEIN BYPASS GRAFT,FEM-TIBIAL Left 01/03/2016   Procedure: Fem-Peroneal Bypass with Ipsilateral GSV;  Surgeon: Lawayne Patience, MD;  Location: MAIN OR [facility];  Service: Vascular  . RSFA stent    [4] Family History Problem Relation Age of Onset  . Cancer Paternal Uncle        brain  . Diabetes Maternal  Grandmother   . Hypertension Maternal Grandmother   . Diabetes Paternal Grandmother   . Hypertension Paternal Grandmother   . Heart disease Paternal Grandfather   . COPD Paternal Grandfather        Lung cancer  . Kidney disease Other        multiple family  members  . Hypertension Other        multiple family members  . Diabetes type I Other        on both sides of family  . Lung cancer Other        grandmother  . Clotting disorder Neg Hx   . Anesthesia problems Neg Hx   . Glaucoma Neg Hx   . Macular degeneration Neg Hx   . Bleeding Disorder Neg Hx   [5] Social History Tobacco Use  . Smoking status: Every Day    Types: Cigars    Start date: 2019    Passive exposure: Past  . Smokeless tobacco: Never  . Tobacco comments:    1-2 Black and Mild cigars per day; relights each one multiple times. Has decided to quit.  Vaping Use  . Vaping status: Former  Substance Use Topics  . Alcohol use: No    Alcohol/week: 0.0 standard drinks of alcohol    Comment: socially, very seldom  . Drug use: Yes    Types: Marijuana

## 2024-05-02 NOTE — Procedures (Signed)
 [facility] Nephrology Hemodialysis Procedure Note   05/02/2024  Raymond Lamb was seen and examined on hemodialysis  CHIEF COMPLAINT: End Stage Renal Disease  INTERVAL HISTORY: Moved to ICU for insulin  gtt. Sleepy (recently used PCA), no complaints.   DIALYSIS TREATMENT DATA:   Patient Goal Weight (kg): 2 kg (4 lb 6.6 oz) (2-3kg)  Pre-Treatment Weight (kg):  ([id number]/[id number])   Dialysis Bath Bath: 3 K+ / 2.5 Ca+ Dialysate Na (mEq/L): 137 mEq/L Dialysate HCO3 (mEq/L): 35 mEq/L Dialyzer: F-180 (98 mLs)  Blood Flow Rate (mL/min): 400 mL/min Dialysis Flow (mL/min): 800 mL/min  Machine Temperature (C): 36.5 C (97.7 F)    PHYSICAL EXAM: Vitals: Temp:  [36.9 C (98.4 F)-37.4 C (99.3 F)] 37.4 C (99.3 F) Pulse:  [78-95] 94 SpO2 Pulse:  [59-98] 89 BP: (105-187)/(55-114) 135/85 MAP (mmHg):  [67-135] 99  General: in no acute distress, currently dialyzing in a Bed Pulmonary: normal respiratory effort and clear to auscultation anteriorly Cardiovascular: regular rate and rhythm Extremities: left AKA right no significant edema  Access: LUE AV fistula  LAB DATA: Lab Results  Component Value Date   NA 135 05/02/2024   K 3.9 05/02/2024   CL 92 (L) 05/02/2024   CO2 29.0 05/02/2024   BUN 30 (H) 05/02/2024   CREATININE 7.15 (H) 05/02/2024   CALCIUM  10.2 05/02/2024   MG 2.0 05/02/2024   PHOS 5.9 (H) 05/02/2024   ALBUMIN 3.2 (L) 04/27/2024    Lab Results  Component Value Date   HCT 27.8 (L) 05/02/2024   WBC 11.4 (H) 05/02/2024     ASSESSMENT/PLAN: End Stage Renal Disease on Intermittent Hemodialysis: UF goal: 2.5L as tolerated based on critline and BP Adjust medications for a GFR <10 Avoid nephrotoxic agents Last HD Treatment:Started (05/02/24)   Bone Mineral Metabolism: Lab Results  Component Value Date   CALCIUM  10.2 05/02/2024   CALCIUM  9.8 05/01/2024   Lab Results  Component Value Date   ALBUMIN 3.2 (L) 04/27/2024   ALBUMIN 3.6 12/19/2023    Lab Results   Component Value Date   PHOS 5.9 (H) 05/02/2024   PHOS 4.5 05/01/2024   Lab Results  Component Value Date   PTH 194.4 (H) 06/23/2015   PTH 248.1 (H) 03/21/2015    Continue phosphorus binder and dietary counseling.  Anemia:  Lab Results  Component Value Date   HGB 8.5 (L) 05/02/2024   HGB 9.1 (L) 05/01/2024   HGB 9.4 (L) 04/30/2024   Iron  Saturation (%)  Date Value Ref Range Status  05/14/2019 35 20 - 50 % Final    Lab Results  Component Value Date   FERRITIN 209.0 07/09/2015     Initiate intravenous Epogen/Retacrit 8000 units with each treatment. - nearly 2 weeks since last Mircera in outpatient HD and Hgb dropping  Vascular Access: Vascular Access functioning well - no need for intervention Blood Flow Rate (mL/min): 400 mL/min  IV Antibiotics to be administered at discharge: TBD  This procedure was fully reviewed with the patient and/or their decision-maker. The risks, benefits, and alternatives were discussed prior to the procedure. All questions were answered and written informed consent was obtained.  Raymond Olam Rams, MD [facility] Division of Nephrology & Hypertension

## 2024-05-03 NOTE — Consults (Signed)
 Endocrine Team Diabetes Follow Up Consult Note   Consult information: Requesting Attending Physician : Lawayne Patience, MD Service Requesting Consult : Surg Vascular (SRV) Primary Care Provider: [name], DO Impression: Raymond Lamb is a 38 y.o. male admitted for worsening foot wound . We have been consulted at the request of Lawayne Patience, MD to evaluate Raymond Lamb for hyperglycemia.   Medical Decision Making: Diagnoses: 1.Type 1 Diabetes. Uncontrolled With severe hypoglycemia last 24 hours.  2. Nutrition: Complicating glycemic control. Increasing risk for both hypoglycemia and hyperglycemia. 3. Infection. Complicating glycemic control and increasing risk for hyperglycemia. 4. End Stage Renal Disease. Complicating glycemic control and increasing risk for hypoglycemia. 5. Coronary Artery Disease. Complicating glycemic control and increasing risk for hyperglycemia and hypoglycemia. 6. Peripheral Vascular Disease. Complicating glycemic control and increasing risk for hyperglycemia.   Studies reviewed 05/03/24: Labs: CBC, BMP, POCT-BG, and HbA1C Interpretation: +Leukocytosis. Anemia noted. Normal potassium. Elevated Cr with reduced GFR in setting of known ESRD. Hyperglycemia with severe. A1C 9.4% (Outside records) indicates poor control outpatient. Notes reviewed: Primary team and nursing notes   Overall impression based on above reviews and history: Hypoglycemia in patient with known type 1 diabetes complicated by acute illness. Ran out of insulin  last night and was placed on MDI.   Today, pt's sister brought the insulin  pump supplies from home and pt has restarted his pump.   Recommendations: - continue insulin  pump  - Hypoglycemia protocol. - POCT-BG q4h. - Ensure patient is on glucose precautions if patient taking nutrition by mouth.    In case of pump failure: Lantus  8 units daily Lispro 3 units with meals  ISF target 140, ISF 40 achs   Discharge  planning: In process. Will complete actual plan closer to discharge.  Will discharge on home pump.  Thank you for this consult. Discussed plan with primary team. We will continue to follow and make recommendations and place orders as appropriate.  Please page with questions or concerns: April Goley, NP: [phone] DCT on call from 6AM - 3PM on weekdays then endocrine fellow on call: [phone] from 3PM - 6AM on weekdays and on weekends and holidays.  If APP cannot be reached, please page the endocrine fellow on call.  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 C/o pain at time of rounds, but ok. PO intake decent. To get iHD today.  Initial HPI: Raymond Lamb is a 38 y.o. male with past medical history of peripheral arterial disease, LUE DVT, T1DM, ESRD (on HD M/W/F), HTN, anoxic brain injury 05/2015, and NSTEMI  admitted for worsening foot wound.   Diabetes History: Patient has a history of Type 1 diabetes. Diabetes is managed by: Dr. Carlin at [facility]. Current home diabetes regimen:  OmniPod 5 in auto mode Basal 0.35 ICR 1 (does not carb count. Estimates units needed per meal)  ISF 80 Target 130   In case of pump failure: Lantus  8 units daily Lispro ~3 units with meals  ISF 1:50   Current home blood glucose monitoring dexcom G7. Hypoglycemia awareness: Unclear. Has a history of severe hypo requiring EMS and glucagon. Complications related to diabetes: retinopathy and ESRD on dialysis   Current Nutrition: Active Orders  Diet   Nutrition Therapy Consistent Carb; Consistent Carb 60/60/60 (4/4/4)    ROS: As per HPI.  Scheduled Medications[1]  Current Outpatient Medications  Medication Instructions  . alcohol swabs PadM 1 each, Topical (Top), 4 times a day  . aspirin  (ECOTRIN) 81 mg, Oral, Daily (standard)  . atorvastatin  (  LIPITOR ) 80 mg, Oral, Daily (standard)  . blood-glucose meter,continuous (DEXCOM G6 RECEIVER) Misc Dispense 1 Dexcom G6 receiver  . blood-glucose  meter,continuous (DEXCOM G7 RECEIVER) Misc Use as instructed with G7 sensors  . blood-glucose sensor (DEXCOM G6 SENSOR) Devi Dispense 1 Dexcom G6 sensor every 10 days  . blood-glucose transmitter (DEXCOM G6 TRANSMITTER) Devi Dispense 1 Dexcom G6 transmitter every 90 days  . cholecalciferol, vitamin D3-50 mcg, 2,000 unit,, 50 mcg (2,000 unit) tablet 1 tablet, Daily (standard)  . cinacalcet  (SENSIPAR ) 90 mg, Oral, Daily (standard)  . clopidogrel  (PLAVIX ) 75 mg, Oral, Daily (standard)  . ezetimibe  (ZETIA ) 10 mg, Oral, Daily (standard)  . ferric citrate  (AURYXIA ) 210 mg iron  Tab tablet Take two tablets by mouth with meals, three times a day. Swallow whole.  Do not chew or crush medication.  . gabapentin  (NEURONTIN ) 600 mg, Oral, Nightly  . glucagon spray 3 mg/actuation Spry Use 1 spray in 1 nostril for severe hypoglycemia, as per package instructions  . HumaLOG KwikPen Insulin  10 Units, Subcutaneous, 3 times a day (AC)  . insulin  lispro (HUMALOG) 100 unit/mL injection Vials to be used in the pump to allow up to 50 units per day.  . insulin  pmp cart,aut,G6/7,cntr (OMNIPOD 5 G6-G7 INTRO KT,GEN5,) Crtg INTRO KIT CONTAIN CONTROLLER AND PODS, TO ALLOW POD CHANGE EVERY 2 DAYS  . insulin  pump cart,auto,BT,G6/7 (OMNIPOD 5 G6-G7 PODS, GEN 5,) Crtg Change pod every 2 days. Pod must be compatible with Dexcom G 6 and G 7.  . insulin  syringe-needle U-100 0.5 mL 31 gauge x 5/16 (8 mm) Syrg Use with insulin  Vials, 4 times per day before each meal or as needed to correct sugar.  . ipratropium (ATROVENT ) 42 mcg (0.06 %) nasal spray 2 sprays, Each Nare, 4 times a day  . isosorbide  mononitrate (IMDUR ) 30 mg, Oral, Daily (standard)  . lancets Misc use to test blood sugar Four (4) times a day with a meal and nightly.  . lancets Misc Use to check blood sugar, up to 5 times a day  . lidocaine  2 % Soln 15 mL, Oral, Every 3 hours PRN  . methocarbamol  (ROBAXIN ) 1,000 mg, Oral, 2 times a day (standard)  . metoPROLOL   succinate (TOPROL  XL) 100 mg, Oral, Daily (standard)  . mirtazapine  (REMERON ) 30 mg, Oral, Nightly  . naloxone (NARCAN) 4 mg, Alternating Nares, Once as needed, One spray in either nostril once for known/suspected opioid overdose. May repeat every 2-3 minutes in alternating nostril til EMS arrives  . OMNIPOD 5 PACK PODS Use 1 Pod every 3 days  . ondansetron  (ZOFRAN -ODT) 4 mg, orally disintegrating tablet, Every 6 hours PRN  . oxyCODONE  (ROXICODONE ) 5 mg, Oral, Every 4 hours PRN  . pantoprazole  (PROTONIX ) 40 mg, Oral, Daily before breakfast  . patiromer  calcium  sorbitex (VELTASSA ) 8.4 gram powder packet Take 1 packet daily on non-dialysis days only  . pen needle, diabetic (UNIFINE PENTIPS) 32 gauge x 5/32 (4 mm) Ndle Use to give insulin  5 times a day  . predniSONE (DELTASONE) 50 mg, Oral, Take as directed, Give 13 hours, 7 hours and 1 hour before contrast procedure.  . sertraline  (ZOLOFT ) 150 mg, Oral, Daily (standard)  . sucralfate (CARAFATE) 1 g, Oral, 4 times a day PRN  . tizanidine (ZANAFLEX) 2 MG tablet TAKE 2 TABLETS BY MOUTH TWICE DAILY AS NEEDED FOR MUSCLE SPASM  . triamcinolone (KENALOG) 0.1 % ointment Topical, 2 times a day (standard), Put on the scabby areas on the legs      Past  Medical History[2]  Past Surgical History[3]  Family History[4]  Short Social History[5]  OBJECTIVE: BP 112/63   Pulse 91   Temp 36.9 C (98.4 F) (Oral)   Resp 13   Wt 63.7 kg (140 lb 6.9 oz)   SpO2 92%   BMI 20.74 kg/m  Wt Readings from Last 12 Encounters:  05/03/24 63.7 kg (140 lb 6.9 oz)  04/04/24 70.3 kg (155 lb)  03/26/24 70.3 kg (155 lb)  02/21/24 68 kg (150 lb)  01/29/24 66.2 kg (146 lb)  01/21/24 66.2 kg (146 lb)  01/15/24 (P) 65.3 kg (144 lb)  12/19/23 65.3 kg (144 lb)  12/06/23 66 kg (145 lb 8.1 oz)  10/16/23 65.8 kg (145 lb)  09/21/23 65.8 kg (145 lb)  09/14/23 66 kg (145 lb 9.6 oz)   Physical Exam Vitals and nursing note reviewed.  Constitutional:      Appearance:  Raymond Lamb is not ill-appearing.  HENT:     Head: Normocephalic and atraumatic.  Skin:    General: Skin is warm and dry.  Neurological:     General: No focal deficit present.     Mental Status: Raymond Lamb is alert and oriented to person, place, and time.       BG/insulin  reviewed per EMR.  Glucose, POC (mg/dL)  Date Value  88/91/7974 269 (H)  05/02/2024 223 (H)  05/02/2024 213 (H)  05/02/2024 146  05/02/2024 123  05/02/2024 125  05/02/2024 181 (H)  05/02/2024 201 (H)     Summary of labs: Lab Results  Component Value Date   A1C 7.6 (H) 12/11/2023   A1C 8.6 (H) 09/10/2023   A1C 7.8 (H) 06/01/2023   Lab Results  Component Value Date   CREATININE 5.25 (H) 05/03/2024   Lab Results  Component Value Date   WBC 11.6 (H) 05/03/2024   HGB 8.3 (L) 05/03/2024   HCT 27.4 (L) 05/03/2024   PLT 398 05/03/2024    Lab Results  Component Value Date   NA 139 05/03/2024   K 4.9 05/03/2024   CL 95 (L) 05/03/2024   CO2 27.0 05/03/2024   BUN 26 (H) 05/03/2024   CREATININE 5.25 (H) 05/03/2024   GLU 271 (H) 05/03/2024   CALCIUM  9.3 05/03/2024   MG 2.0 05/03/2024   PHOS 5.4 (H) 05/03/2024    Lab Results  Component Value Date   BILITOT <0.2 (L) 04/27/2024   BILIDIR <0.10 11/20/2020   PROT 8.5 (H) 04/27/2024   ALBUMIN 3.2 (L) 04/27/2024   ALT 19 04/27/2024   AST 30 04/27/2024   ALKPHOS 94 04/27/2024   GGT 20 09/10/2017                  [1] . acetaminophen   1,000 mg Oral Q8H  . aspirin   81 mg Oral Daily  . atorvastatin   80 mg Oral Daily  . clopidogrel   75 mg Oral Daily  . senna  1 tablet Oral Daily   And  . docusate sodium   100 mg Oral Daily  . ezetimibe   10 mg Oral Daily  . gabapentin   600 mg Oral Nightly  . heparin  (porcine) for subcutaneous use  5,000 Units Subcutaneous Q8H SCH  . insulin  glargine  8 Units Subcutaneous Nightly  . insulin  lispro  3 Units Subcutaneous 3xd Meals  . insulin  lispro  0-5 Units Subcutaneous 5XD insulin   . isosorbide  mononitrate  30 mg  Oral Daily  . methocarbamol   500 mg Oral QID  . metoPROLOL  tartrate  50 mg Oral BID  .  mirtazapine   30 mg Oral Nightly  . pantoprazole   40 mg Oral Daily before breakfast  . sertraline   150 mg Oral Daily  . sevelamer  800 mg Oral 3xd Meals  [2] Past Medical History: Diagnosis Date  . Atrial fibrillation    (CMS-HCC) 12/13/2020   ECG 06-16-2015 noted Atrial Fib with RVR noted during ED to Adm after PEA arrest.  . Cardiac arrest (CMS-HCC) 05/2015  . Cardiomyopathy (CMS-HCC)   . DKA (diabetic ketoacidoses) 04/20/2016  . DVT (deep venous thrombosis) (CMS-HCC)   . ESRD (end stage renal disease) on dialysis    (CMS-HCC)    T Th Sat  . GERD (gastroesophageal reflux disease) 08/03/2021  . Hemiplegia and hemiparesis following cerebral infarction affecting left non-dominant side    (CMS-HCC) 08/08/2022  . Hypertension   . Myocardial infarction (CMS-HCC)   . Peripheral arterial occlusive disease   . 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 Stroke    (CMS-HCC) 05/2015   left sided weakness, gets around with a wheelchair since stroke  . Type 1 diabetes (CMS-HCC)    Dignosed age 44. 2 episodes of DKA in past  . 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 AV FISTULA REPAIR  01/06/2019   Ballon angioplasty of stenotic basillic vein  . NASAL FRACTURE SURGERY     as teenager  . PR AMPUTATE THIGH,THRU FEMUR Left 04/29/2024   Procedure: AMPUTA THIGH THRU FEMUR ANY LEVEL;  Surgeon: Ave Bosworth, MD;  Location: OR [facility];  Service: Vascular  . PR AMPUTATION FOOT,TRANSMETATARSAL Left 03/23/2016   Procedure: AMPUTATION, FOOT; TRANSMETATARSAL;  Surgeon: Selinda Bernardino Baston, MD;  Location: MAIN [facility];  Service: Vascular  . PR AMPUTATION METATARSAL+TOE,SINGLE Left 01/14/2016   Procedure: AMPUTATION, METATARSAL, WITH TOE SINGLE. L 4th toe;  Surgeon: Bosworth Ave, MD;  Location: MAIN OR [facility];  Service: Vascular  . PR AV ANAST,FOREARM VEIN  TRANSPOSITION Left 05/08/2016   Procedure: Left Radial Cephalic fisutal vs Brachial Cephalic Fistula;  Surgeon: Bosworth Ave, MD;  Location: MAIN OR [facility];  Service: Vascular  . PR CATH PLACE/CORON ANGIO, IMG SUPER/INTERP,W LEFT HEART VENTRICULOGRAPHY N/A 12/21/2015   Procedure: Left Heart Catheterization;  Surgeon: Suella Finders, MD;  Location: [facility] CATH;  Service: Cardiology  . PR CATH PLACE/CORON ANGIO, IMG SUPER/INTERP,W LEFT HEART VENTRICULOGRAPHY N/A 11/18/2020   Procedure: Left Heart Catheterization;  Surgeon: Norleen Deward Grumbling, MD;  Location: [facility] CATH;  Service: Cardiology  . PR DEBRIDEMENT BONE 1ST 20 SQ CM/< Left 03/23/2016   Procedure: DEBRIDEMENT; SKIN, SUBCUTANEOUS TISSUE, MUSCLE, & BONE FOOT;  Surgeon: Selinda Bernardino Baston, MD;  Location: MAIN OR [facility];  Service: Vascular  . PR EXPLORATION N/FLWD SURG LOWER EXTREMITY ARTERY Left 03/26/2024   Procedure: EXPLORATION NOT FOLLOWED BY SURGICAL REPAIR, ARTERY; LOWER EXTREMITY (EG, COMMON FEMORAL, DEEP FEMORAL, SUPERFICIAL FEMORAL, POPLITEAL, TIBIAL, PERONEAL);  Surgeon: Ave Bosworth, MD;  Location: OR [facility];  Service: Vascular  . PR VEIN BYPASS GRAFT,FEM-TIBIAL Left 01/03/2016   Procedure: Fem-Peroneal Bypass with Ipsilateral GSV;  Surgeon: Bosworth Ave, MD;  Location: MAIN OR [facility];  Service: Vascular  . RSFA stent    [4] Family History Problem Relation Age of Onset  . Cancer Paternal Uncle        brain  . Diabetes Maternal Grandmother   . Hypertension Maternal Grandmother   . Diabetes Paternal Grandmother   . Hypertension Paternal Grandmother   . Heart disease Paternal Grandfather   . COPD Paternal Grandfather  Lung cancer  . Kidney disease Other        multiple family members  . Hypertension Other        multiple family members  . Diabetes type I Other        on both sides of family  . Lung cancer Other        grandmother  . Clotting disorder Neg Hx   . Anesthesia problems Neg Hx   . Glaucoma Neg Hx   . Macular  degeneration Neg Hx   . Bleeding Disorder Neg Hx   [5] Social History Tobacco Use  . Smoking status: Every Day    Types: Cigars    Start date: 2019    Passive exposure: Past  . Smokeless tobacco: Never  . Tobacco comments:    1-2 Black and Mild cigars per day; relights each one multiple times. Has decided to quit.  Vaping Use  . Vaping status: Former  Substance Use Topics  . Alcohol use: No    Alcohol/week: 0.0 standard drinks of alcohol    Comment: socially, very seldom  . Drug use: Yes    Types: Marijuana

## 2024-05-04 NOTE — Consults (Signed)
 Endocrine Team Diabetes Follow Up Consult Note   Consult information: Requesting Attending Physician : Lawayne Patience, MD Service Requesting Consult : Surg Vascular (SRV) Primary Care Provider: [name], DO Impression: Raymond Lamb is a 38 y.o. male admitted for worsening foot wound . We have been consulted at the request of Lawayne Patience, MD to evaluate Raymond Lamb for hyperglycemia.   Medical Decision Making: Diagnoses: 1.Type 1 Diabetes. Uncontrolled With severe hypoglycemia last 24 hours.  2. Nutrition: Complicating glycemic control. Increasing risk for both hypoglycemia and hyperglycemia. 3. Infection. Complicating glycemic control and increasing risk for hyperglycemia. 4. End Stage Renal Disease. Complicating glycemic control and increasing risk for hypoglycemia. 5. Coronary Artery Disease. Complicating glycemic control and increasing risk for hyperglycemia and hypoglycemia. 6. Peripheral Vascular Disease. Complicating glycemic control and increasing risk for hyperglycemia.  Studies reviewed 05/04/24: Labs: CBC, BMP, POCT-BG, and HbA1C Interpretation: +Leukocytosis. Anemia noted. Normal potassium. Elevated Cr with reduced GFR in setting of known ESRD. Hyperglycemia with severe. A1C 9.4% (Outside records) indicates poor control outpatient. Notes reviewed: Primary team and nursing notes  Overall impression based on above reviews and history: Hypoglycemia in patient with known type 1 diabetes complicated by acute illness. OmniPod ran out of supplies during admission and pump was stopped, but yesterday family members brought the pump supplies from home and pt restarted his pump.  For a few hours afterwards, glucose elevated very high but has since improved. BMP, venous gas, beta hydrox obtained last night to r/o DKA just to be safe.  Continue insulin  pump.  Nursing to continue ACHS POC glucose checks while he remains inpatient.  Recommendations: - continue  insulin  pump  - Hypoglycemia protocol. - POCT-BG achs. - Ensure patient is on glucose precautions if patient taking nutrition by mouth.    In case of pump failure: Lantus  8 units daily Lispro 3 units with meals  ISF target 140, ISF 40 achs   Discharge planning: In process. Will complete actual plan closer to discharge.  Will discharge on home pump.  Thank you for this consult. Discussed plan with primary team. We will continue to follow and make recommendations and place orders as appropriate.  Please page with questions or concerns: April Goley, NP: [phone] DCT on call from 6AM - 3PM on weekdays then endocrine fellow on call: [phone] from 3PM - 6AM on weekdays and on weekends and holidays.  If APP cannot be reached, please page the endocrine fellow on call.  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 C/o pain at time of rounds, but ok. PO intake decent. To get iHD today.  Initial HPI: Raymond Lamb is a 38 y.o. male with past medical history of peripheral arterial disease, LUE DVT, T1DM, ESRD (on HD M/W/F), HTN, anoxic brain injury 05/2015, and NSTEMI  admitted for worsening foot wound.   Diabetes History: Patient has a history of Type 1 diabetes. Diabetes is managed by: Dr. Carlin at [facility] Current home diabetes regimen:  OmniPod 5 in auto mode Basal 0.35 ICR 1 (does not carb count. Estimates units needed per meal)  ISF 80 Target 130   In case of pump failure: Lantus  8 units daily Lispro ~3 units with meals  ISF 1:50   Current home blood glucose monitoring dexcom G7. Hypoglycemia awareness: Unclear. Has a history of severe hypo requiring EMS and glucagon. Complications related to diabetes: retinopathy and ESRD on dialysis   Current Nutrition: Active Orders  Diet   Nutrition Therapy Consistent Carb; Consistent Carb 60/60/60 (4/4/4)  ROS: As per HPI.  Scheduled Medications[1]  Current Outpatient Medications  Medication Instructions  . alcohol  swabs PadM 1 each, Topical (Top), 4 times a day  . aspirin  (ECOTRIN) 81 mg, Oral, Daily (standard)  . atorvastatin  (LIPITOR ) 80 mg, Oral, Daily (standard)  . blood-glucose meter,continuous (DEXCOM G6 RECEIVER) Misc Dispense 1 Dexcom G6 receiver  . blood-glucose meter,continuous (DEXCOM G7 RECEIVER) Misc Use as instructed with G7 sensors  . blood-glucose sensor (DEXCOM G6 SENSOR) Devi Dispense 1 Dexcom G6 sensor every 10 days  . blood-glucose transmitter (DEXCOM G6 TRANSMITTER) Devi Dispense 1 Dexcom G6 transmitter every 90 days  . cholecalciferol, vitamin D3-50 mcg, 2,000 unit,, 50 mcg (2,000 unit) tablet 1 tablet, Daily (standard)  . cinacalcet  (SENSIPAR ) 90 mg, Oral, Daily (standard)  . clopidogrel  (PLAVIX ) 75 mg, Oral, Daily (standard)  . ezetimibe  (ZETIA ) 10 mg, Oral, Daily (standard)  . ferric citrate  (AURYXIA ) 210 mg iron  Tab tablet Take two tablets by mouth with meals, three times a day. Swallow whole.  Do not chew or crush medication.  . gabapentin  (NEURONTIN ) 600 mg, Oral, Nightly  . glucagon spray 3 mg/actuation Spry Use 1 spray in 1 nostril for severe hypoglycemia, as per package instructions  . HumaLOG KwikPen Insulin  10 Units, Subcutaneous, 3 times a day (AC)  . insulin  lispro (HUMALOG) 100 unit/mL injection Vials to be used in the pump to allow up to 50 units per day.  . insulin  pmp cart,aut,G6/7,cntr (OMNIPOD 5 G6-G7 INTRO KT,GEN5,) Crtg INTRO KIT CONTAIN CONTROLLER AND PODS, TO ALLOW POD CHANGE EVERY 2 DAYS  . insulin  pump cart,auto,BT,G6/7 (OMNIPOD 5 G6-G7 PODS, GEN 5,) Crtg Change pod every 2 days. Pod must be compatible with Dexcom G 6 and G 7.  . insulin  syringe-needle U-100 0.5 mL 31 gauge x 5/16 (8 mm) Syrg Use with insulin  Vials, 4 times per day before each meal or as needed to correct sugar.  . ipratropium (ATROVENT ) 42 mcg (0.06 %) nasal spray 2 sprays, Each Nare, 4 times a day  . isosorbide  mononitrate (IMDUR ) 30 mg, Oral, Daily (standard)  . lancets Misc use to test  blood sugar Four (4) times a day with a meal and nightly.  . lancets Misc Use to check blood sugar, up to 5 times a day  . lidocaine  2 % Soln 15 mL, Oral, Every 3 hours PRN  . methocarbamol  (ROBAXIN ) 1,000 mg, Oral, 2 times a day (standard)  . metoPROLOL  succinate (TOPROL  XL) 100 mg, Oral, Daily (standard)  . mirtazapine  (REMERON ) 30 mg, Oral, Nightly  . naloxone (NARCAN) 4 mg, Alternating Nares, Once as needed, One spray in either nostril once for known/suspected opioid overdose. May repeat every 2-3 minutes in alternating nostril til EMS arrives  . OMNIPOD 5 PACK PODS Use 1 Pod every 3 days  . ondansetron  (ZOFRAN -ODT) 4 mg, orally disintegrating tablet, Every 6 hours PRN  . oxyCODONE  (ROXICODONE ) 5 mg, Oral, Every 4 hours PRN  . pantoprazole  (PROTONIX ) 40 mg, Oral, Daily before breakfast  . patiromer  calcium  sorbitex (VELTASSA ) 8.4 gram powder packet Take 1 packet daily on non-dialysis days only  . pen needle, diabetic (UNIFINE PENTIPS) 32 gauge x 5/32 (4 mm) Ndle Use to give insulin  5 times a day  . predniSONE (DELTASONE) 50 mg, Oral, Take as directed, Give 13 hours, 7 hours and 1 hour before contrast procedure.  . sertraline  (ZOLOFT ) 150 mg, Oral, Daily (standard)  . sucralfate (CARAFATE) 1 g, Oral, 4 times a day PRN  . tizanidine (ZANAFLEX) 2 MG tablet  TAKE 2 TABLETS BY MOUTH TWICE DAILY AS NEEDED FOR MUSCLE SPASM  . triamcinolone (KENALOG) 0.1 % ointment Topical, 2 times a day (standard), Put on the scabby areas on the legs      Past Medical History[2]  Past Surgical History[3]  Family History[4]  Short Social History[5]  OBJECTIVE: BP 104/62   Pulse 84   Temp 36.6 C (97.9 F) (Oral)   Resp 16   Ht 175.3 cm (5' 9)   Wt 63.7 kg (140 lb 6.9 oz)   SpO2 95%   BMI 20.74 kg/m  Wt Readings from Last 12 Encounters:  05/03/24 63.7 kg (140 lb 6.9 oz)  04/04/24 70.3 kg (155 lb)  03/26/24 70.3 kg (155 lb)  02/21/24 68 kg (150 lb)  01/29/24 66.2 kg (146 lb)  01/21/24 66.2 kg  (146 lb)  01/15/24 (P) 65.3 kg (144 lb)  12/19/23 65.3 kg (144 lb)  12/06/23 66 kg (145 lb 8.1 oz)  10/16/23 65.8 kg (145 lb)  09/21/23 65.8 kg (145 lb)  09/14/23 66 kg (145 lb 9.6 oz)     Physical Exam Vitals and nursing note reviewed.  Constitutional:      Appearance: He is not ill-appearing.  HENT:     Head: Normocephalic and atraumatic.  Cardiovascular:     Rate and Rhythm: Normal rate.  Pulmonary:     Effort: Pulmonary effort is normal. No respiratory distress.  Skin:    General: Skin is warm and dry.  Neurological:     General: No focal deficit present.     Mental Status: He is alert and oriented to person, place, and time.       BG/insulin  reviewed per EMR.  Glucose, POC (mg/dL)  Date Value  88/90/7974 147  05/03/2024 208 (H)  05/03/2024 200 (H)  05/03/2024 413 (HH)  05/03/2024 357 (H)  05/03/2024 224 (H)  05/03/2024 269 (H)  05/02/2024 223 (H)     Summary of labs: Lab Results  Component Value Date   A1C 7.6 (H) 12/11/2023   A1C 8.6 (H) 09/10/2023   A1C 7.8 (H) 06/01/2023   Lab Results  Component Value Date   CREATININE 7.42 (H) 05/04/2024   Lab Results  Component Value Date   WBC 9.0 05/04/2024   HGB 8.5 (L) 05/04/2024   HCT 27.3 (L) 05/04/2024   PLT 456 (H) 05/04/2024    Lab Results  Component Value Date   NA 141 05/04/2024   K 4.2 05/04/2024   CL 96 (L) 05/04/2024   CO2 26.0 05/04/2024   BUN 33 (H) 05/04/2024   CREATININE 7.42 (H) 05/04/2024   GLU 146 05/04/2024   CALCIUM  9.9 05/04/2024   MG 2.1 05/04/2024   PHOS 5.5 (H) 05/04/2024    Lab Results  Component Value Date   BILITOT <0.2 (L) 04/27/2024   BILIDIR <0.10 11/20/2020   PROT 8.5 (H) 04/27/2024   ALBUMIN 3.2 (L) 04/27/2024   ALT 19 04/27/2024   AST 30 04/27/2024   ALKPHOS 94 04/27/2024   GGT 20 09/10/2017                   [1] . acetaminophen   1,000 mg Oral Q8H  . aspirin   81 mg Oral Daily  . atorvastatin   80 mg Oral Daily  . clopidogrel   75 mg  Oral Daily  . senna  1 tablet Oral Daily   And  . docusate sodium   100 mg Oral Daily  . ezetimibe   10 mg Oral Daily  .  gabapentin   600 mg Oral Nightly  . heparin  (porcine) for subcutaneous use  5,000 Units Subcutaneous Q8H SCH  . isosorbide  mononitrate  30 mg Oral Daily  . methocarbamol   500 mg Oral QID  . metoPROLOL  tartrate  50 mg Oral BID  . mirtazapine   30 mg Oral Nightly  . pantoprazole   40 mg Oral Daily before breakfast  . sertraline   150 mg Oral Daily  . sevelamer  800 mg Oral 3xd Meals  [2] Past Medical History: Diagnosis Date  . Atrial fibrillation    (CMS-HCC) 12/13/2020   ECG 06-16-2015 noted Atrial Fib with RVR noted during ED to Adm after PEA arrest.  . Cardiac arrest (CMS-HCC) 05/2015  . Cardiomyopathy (CMS-HCC)   . DKA (diabetic ketoacidoses) 04/20/2016  . DVT (deep venous thrombosis) (CMS-HCC)   . ESRD (end stage renal disease) on dialysis    (CMS-HCC)    T Th Sat  . GERD (gastroesophageal reflux disease) 08/03/2021  . Hemiplegia and hemiparesis following cerebral infarction affecting left non-dominant side    (CMS-HCC) 08/08/2022  . Hypertension   . Myocardial infarction (CMS-HCC)   . Peripheral arterial occlusive disease   . 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 Stroke    (CMS-HCC) 05/2015   left sided weakness, gets around with a wheelchair since stroke  . Type 1 diabetes (CMS-HCC)    Dignosed age 62. 2 episodes of DKA in past  . 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 AV FISTULA REPAIR  01/06/2019   Ballon angioplasty of stenotic basillic vein  . NASAL FRACTURE SURGERY     as teenager  . PR AMPUTATE THIGH,THRU FEMUR Left 04/29/2024   Procedure: AMPUTA THIGH THRU FEMUR ANY LEVEL;  Surgeon: Ave Bosworth, MD;  Location: OR [facility];  Service: Vascular  . PR AMPUTATION FOOT,TRANSMETATARSAL Left 03/23/2016   Procedure: AMPUTATION, FOOT; TRANSMETATARSAL;  Surgeon: Selinda Bernardino Baston, MD;   Location: MAIN [facility];  Service: Vascular  . PR AMPUTATION METATARSAL+TOE,SINGLE Left 01/14/2016   Procedure: AMPUTATION, METATARSAL, WITH TOE SINGLE. L 4th toe;  Surgeon: Bosworth Ave, MD;  Location: MAIN OR [facility];  Service: Vascular  . PR AV ANAST,FOREARM VEIN TRANSPOSITION Left 05/08/2016   Procedure: Left Radial Cephalic fisutal vs Brachial Cephalic Fistula;  Surgeon: Bosworth Ave, MD;  Location: MAIN OR [facility];  Service: Vascular  . PR CATH PLACE/CORON ANGIO, IMG SUPER/INTERP,W LEFT HEART VENTRICULOGRAPHY N/A 12/21/2015   Procedure: Left Heart Catheterization;  Surgeon: Suella Finders, MD;  Location: [facility] CATH;  Service: Cardiology  . PR CATH PLACE/CORON ANGIO, IMG SUPER/INTERP,W LEFT HEART VENTRICULOGRAPHY N/A 11/18/2020   Procedure: Left Heart Catheterization;  Surgeon: Norleen Deward Grumbling, MD;  Location: [facility] CATH;  Service: Cardiology  . PR DEBRIDEMENT BONE 1ST 20 SQ CM/< Left 03/23/2016   Procedure: DEBRIDEMENT; SKIN, SUBCUTANEOUS TISSUE, MUSCLE, & BONE FOOT;  Surgeon: Selinda Bernardino Baston, MD;  Location: MAIN OR [facility];  Service: Vascular  . PR EXPLORATION N/FLWD SURG LOWER EXTREMITY ARTERY Left 03/26/2024   Procedure: EXPLORATION NOT FOLLOWED BY SURGICAL REPAIR, ARTERY; LOWER EXTREMITY (EG, COMMON FEMORAL, DEEP FEMORAL, SUPERFICIAL FEMORAL, POPLITEAL, TIBIAL, PERONEAL);  Surgeon: Ave Bosworth, MD;  Location: OR [facility];  Service: Vascular  . PR VEIN BYPASS GRAFT,FEM-TIBIAL Left 01/03/2016   Procedure: Fem-Peroneal Bypass with Ipsilateral GSV;  Surgeon: Bosworth Ave, MD;  Location: MAIN OR [facility];  Service: Vascular  . RSFA stent    [4] Family History Problem Relation Age of Onset  . Cancer Paternal Uncle  brain  . Diabetes Maternal Grandmother   . Hypertension Maternal Grandmother   . Diabetes Paternal Grandmother   . Hypertension Paternal Grandmother   . Heart disease Paternal Grandfather   . COPD Paternal Grandfather        Lung cancer  . Kidney disease Other         multiple family members  . Hypertension Other        multiple family members  . Diabetes type I Other        on both sides of family  . Lung cancer Other        grandmother  . Clotting disorder Neg Hx   . Anesthesia problems Neg Hx   . Glaucoma Neg Hx   . Macular degeneration Neg Hx   . Bleeding Disorder Neg Hx   [5] Social History Tobacco Use  . Smoking status: Every Day    Types: Cigars    Start date: 2019    Passive exposure: Past  . Smokeless tobacco: Never  . Tobacco comments:    1-2 Black and Mild cigars per day; relights each one multiple times. Has decided to quit.  Vaping Use  . Vaping status: Former  Substance Use Topics  . Alcohol use: No    Alcohol/week: 0.0 standard drinks of alcohol    Comment: socially, very seldom  . Drug use: Yes    Types: Marijuana

## 2024-05-05 NOTE — Procedures (Signed)
 [facility] Nephrology Hemodialysis Procedure Note   05/05/2024  Raymond Lamb was seen and examined on hemodialysis  CHIEF COMPLAINT: End Stage Renal Disease  INTERVAL HISTORY: S/P RLE AKA - having a lot of pain so giving oxycodone  5mg  x 1. Otherwise doing okay, no cramping.   DIALYSIS TREATMENT DATA: Estimated Dry Weight (kg): 63.9 kg (140 lb 14 oz) Patient Goal Weight (kg): 2 kg (4 lb 6.6 oz)  Pre-Treatment Weight (kg):  (utw d/t pt refusal)   Dialysis Bath Bath: 2 K+ / 2.5 Ca+ Dialysate Na (mEq/L): 137 mEq/L Dialysate HCO3 (mEq/L): 35 mEq/L Dialyzer: F-180 (98 mLs)  Blood Flow Rate (mL/min): 400 mL/min Dialysis Flow (mL/min): 800 mL/min  Machine Temperature (C): 36.5 C (97.7 F)    PHYSICAL EXAM: Vitals: Temp:  [36.4 C (97.5 F)-36.8 C (98.3 F)] 36.8 C (98.3 F) Pulse:  [76-88] 86 BP: (91-158)/(56-88) 121/69 MAP (mmHg):  [68-104] 86  General: in no acute distress, currently dialyzing in a Bed Pulmonary: normal respiratory effort Cardiovascular: regular rate and rhythm Extremities: trace  edema Access: LUE AV fistula  LAB DATA: Lab Results  Component Value Date   NA 139 05/05/2024   K 4.9 (H) 05/05/2024   CL 94 (L) 05/05/2024   CO2 26.0 05/05/2024   BUN 45 (H) 05/05/2024   CREATININE 9.40 (H) 05/05/2024   CALCIUM  9.6 05/05/2024   MG 2.0 05/05/2024   PHOS 6.2 (H) 05/05/2024   ALBUMIN 3.2 (L) 04/27/2024    Lab Results  Component Value Date   HCT 28.4 (L) 05/05/2024   WBC 8.7 05/05/2024     ASSESSMENT/PLAN: End Stage Renal Disease on Intermittent Hemodialysis: UF goal: 2.7L as tolerated Adjust medications for a GFR <10 Avoid nephrotoxic agents Last HD Treatment:Started (05/05/24)   Bone Mineral Metabolism: Lab Results  Component Value Date   CALCIUM  9.6 05/05/2024   CALCIUM  9.9 05/04/2024   Lab Results  Component Value Date   ALBUMIN 3.2 (L) 04/27/2024   ALBUMIN 3.6 12/19/2023    Lab Results  Component Value Date   PHOS 6.2 (H)  05/05/2024   PHOS 5.5 (H) 05/04/2024   Lab Results  Component Value Date   PTH 194.4 (H) 06/23/2015   PTH 248.1 (H) 03/21/2015    Labs appropriate, no changes.  Anemia:  Lab Results  Component Value Date   HGB 9.1 (L) 05/05/2024   HGB 8.5 (L) 05/04/2024   HGB 8.3 (L) 05/03/2024   Iron  Saturation (%)  Date Value Ref Range Status  05/03/2024 20 20 - 55 % Final    Lab Results  Component Value Date   FERRITIN 209.0 07/09/2015     Anemia labs appropriate, no changes.  Vascular Access: Vascular Access functioning well - no need for intervention Blood Flow Rate (mL/min): 400 mL/min  IV Antibiotics to be administered at discharge: No  This procedure was fully reviewed with the patient and/or their decision-maker. The risks, benefits, and alternatives were discussed prior to the procedure. All questions were answered and written informed consent was obtained.  Amy MARLA Ku, MD [facility] Division of Nephrology & Hypertension

## 2024-05-05 NOTE — Consults (Addendum)
 Physical Medicine and Rehabilitation Inpatient Consult Note  Requesting Attending Physician: Lawayne Patience, MD Service Requesting Consult: Surg Vascular (SRV) Thank you for this consult.  Please contact the PM&R consult pager [phone]) for questions regarding these recommendations.  For questions of bed availability at [facility], contact the Intake Office at [phone]. Please contact your case manager for updates on AIR process as they are in regular contact with the rehab intake office.  ASSESSMENT:  Raymond Lamb is a 38 y.o. 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  RECOMMENDATIONS: Spasticity: secondary to right pontine ischemic stroke (2016). Outpatient medications include tizanidine 2mg  daily PRN. Follows with PM&R (Dr. Asencion) outpatient and receives botulinum toxin injections for spasticity management, last received 01/15/24. - continue home tizanidine 2mg  daily PRN  Phantom Limb Pain: - Discussed desensitization techniques such as mirror therapy, tapping or massaging the residual limb to assess with pain control  - continue gabapentin  600mg  nightly  Residual Limb Care: - appreciate Prosthetics and Orthotics involvement for limb guard and/or shrinkers.  Amputation Counseling: - Counseled patient about amputation process and life after amputation including process of getting a prosthesis  DISCHARGE RECOMMENDATIONS: - Recommendation: ACUTE INPATIENT REHABILITATION (AIR).  - Patient/family agreed to [facility] AIR. Based on the patient's post-operative status, medical complexity, and functional needs, inpatient rehabilitation is medically necessary to achieve a safe, effective  recovery and to maximize functional outcomes. Lower levels of care, such as SNF or home health, do not provide the intensity interdisciplinary coordination, or medical supervision required for this patient's recovery phase. The patient would benefit from medical oversight from a rehabilitation physician in order to prevent post-operative wound and infectious complications, monitor residual limb edema particularly in the setting of ESRD on HD in order to optimize residual limb shape for future acceptance of a potential prosthesis, optimize pain management. Additionally, patient has multiple comorbidities, including CAD, prior NSTEMI, T1DM, and renal disease that require close medical supervision and care coordination. Patient utilizes an insulin  pump for management of his T1DM that was unable to be utilized during a majority of his acute hospitalization so was placed on an alternative insulin  regimen. He recently resumed use of his insulin  pump with noted subsequent elevated blood glucose and will require ongoing close monitoring of his blood glucose. Lastly, patient has a history of a pontine ischemic stroke with resultant spasticity. He was not on his home medication for this during a portion of his acute hospitalization. While this medication has recently been resumed, he would benefit from the oversight of a rehabilitation physician for management of his spasticity with close attention to side effect monitoring and possible orthostasis secondary to medication titration that could exacerbate underlying orthostasis from increased activity following prolonged decreased mobility.  FOLLOW UP: - Please arrange Ambulatory PM&R Amputee Clinic Follow up with Dr. Randine Mt, Dr. Elsie Handler or Dr. Norleen Ozell Asencion at [facility] for [facility] [place], [place], phone: [phone])   AIR PLANNING: *This PM&R consult does not guarantee that patient has been accepted to [facility] AIR,  but can be helpful to guide patient progression*  Checklist For Potential Admission to [facility] AIR: [ ]  EDD/what is estimated discharge date? [ ]  Referral  from CM if wants [facility] AIR [ ]  PT/OT within 48 hours of AIR [ ]  dispo verified and [facility] AIR preference [ ]  CBC/BMP & other pertinent labs within 48 hours of AIR [ ]  Prefer oral pain meds only, but if on IV pain meds, then no more than q 6-8 hours prn [ ]  If on dialysis, must complete HD in a dialysis recliner and be transitioned to a MWF schedule prior to AIR  Discharge Plan After AIR: - patient lives in a 1 level home with 0 STE  In order for the patient to be considered for admission to [facility] inpatient rehab, the case manager must give the patient / family choice in post-acute discharge options. If the patient desires [facility], a referral from case management is required for acceptance to [facility] inpatient rehab.  A referral for [facility] inpatient rehab has not been received.   ______________________________________________ Thank you for involving us  in the care of this patient.   Kaitlyn DeHority, MD [facility] Physical Medicine & Rehabilitation   MEDICAL DECISION MAKING (level of service defined by 2/3 elements)   Number/Complexity of Problems Addressed --HIGH (99205/99215)-- 1 acute or chronic illness or injury that poses a threat to life or bodily function (99205/99215)  Amount/Complexity of Data to be Reviewed/Analyzed 3 points: Review prior notes (1 point per unique source); Review test results (1 point per unique test); Order tests (1 point per unique test); Assessment requiring an independent historian (99204/99214) Discussion of management or test interpretation with external physician/other qualified health care professional/appropriate source (99204/99214) --EXTENSIVE (99205/99215)-- Meets at least 2 of the 3 MODERATE criteria above (99205/99215)  Risk of Complications/Morbidity/Mortality of Management --HIGH Risk of Morbidity from Additional  Diagnostic Testing or Treatment (99205/99215)--   TIME   Total Time for E/M Services on the Date of Encounter Time-based coding not utilized for this encounter   Reviewed labs, endocrinology/nephrology notes, PT/OT treatment notes discussion of management with primary team.    SUBJECTIVE  Reason for Consult: Patient seen in consultation at the request of Lawayne Patience, MD for evaluation of rehabilitation needs and recommendations.  History of Present Illness: Raymond Lamb is a 38 y.o. 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  Medical Complexity: #h/o PAD, gangrene s/p L AKA 11/4 - ASA, statin, plavix  - limbguard delivered by P&O - PT/OT  #Acute Pain - oxycodone  5-10mg  q4h PRN - tylenol  1g TID - robaxin  500mg  QID - gabapentin  600mg  nightly - tizanidine 2mg  daily PRN  #h/o NSTEMI: - imdur , statin, metoprolol   #ESRD on HD - HD MWF - sevelamer  #T1DM: has insulin  pump that is currently not functioning. Endocrinology has been following -Lantus  7 units daily -Lispro 2 units with meals  -Lispro correction target 140, ISF 50 achs - Hypoglycemia protocol. - POCT-BG achs. - Ensure patient is on glucose precautions if patient taking nutrition by mouth.   #risk for constipation: decreased mobility and opioid pain medications are contributing factors - senna 1 tab daily - colace 100mg  daily  #Spasticity: secondary to right pontine ischemic stroke (2016). Outpatient medications include tizanidine 2mg  daily PRN. Follows with PM&R (Dr. Asencion) outpatient and receives botulinum toxin injections for spasticity management, last received 01/15/24. - tizanidine 2mg  daily PRN (home med)  #VTE Risk -  heparin   # Impaired ADLs, fall risk, impaired functional gait, mobility, balance, and endurance - Please continue to have patient work with PT and OT to maximize functional status with mobility and ADLs. - Fall risk precautions and OOB to chair daily with assistance if appropriate - Discussed rehab options today with patient. The patient/family/caregiver was counseled and educated on the purpose of AIR and questions/concerns were addressed.   Today Patient no longer on PCA or IV pain medications. Reports feeling motivated to work with therapy and get to AIR. During PT session on 11/9, he was able to perform stand pivot transfer to commode with CGA and demonstrated ability to propel his MWC with RUE and RLE (LUE function limited by prior stroke and resultant spasticity/contracture). Seen in HD today.  Past Medical and Surgical History:  Past Medical History[1] Past Surgical History[2]   Cancer History: Hematology/Oncology History   No problem history exists.    Social History:  Short Social History[3]  Social History   Social History Narrative  . Not on file    Living Environment: House Lives With: Mother Home Living: Level entry, Tub/shower unit, Standard height toilet, Grab bars in shower, Shower chair with back, One level home Caregiver Identified?: Yes Caregiver Availability: 24 hours Caregiver Ability: Limited lifting  Family History: Reviewed, noncontributory to rehab family history includes COPD in his paternal grandfather; Cancer in his paternal uncle; Diabetes in his maternal grandmother and paternal grandmother; Diabetes type I in an other family member; Heart disease in his paternal grandfather; Hypertension in his maternal grandmother, paternal grandmother, and another family member; Kidney disease in an other family member; Lung cancer in an other family member.  Allergies:  Baclofen, Camellia oleifera seed oil, Iodinated contrast media, Lisinopril, and  Oxybenzone(benzophenone 3)  Medications:  Scheduled Scheduled meds with Route[4] PRN dextrose  in water, 12.5 g, Q15 Min PRN senna, 1 tablet, Daily PRN  And docusate sodium , 100 mg, Daily PRN epoetin alfa-EPBX, 12,000 Units, Three times a week in dialysis glucagon, 1 mg, Once PRN glucose, 16 g, Q10 Min PRN insulin  lispro, 2 Units, TID PRN labetalol , 10 mg, Q4H PRN loperamide, 2 mg, TID PRN melatonin, 3 mg, Nightly PRN ondansetron , 4 mg, Q8H PRN oxyCODONE , 5 mg, Q4H PRN  Or oxyCODONE , 10 mg, Q4H PRN prochlorperazine, 2.5 mg, Q6H PRN simethicone, 80 mg, Q6H PRN tizanidine, 2 mg, Daily PRN   Continuous Infusions     Review of Systems:   General ROS: negative Psychological ROS: negative Ophthalmic ROS: negative Respiratory ROS: no cough, shortness of breath, or wheezing Cardiovascular ROS: no chest pain or dyspnea on exertion Gastrointestinal ROS: positive for - nausea, loss of appetite Genito-Urinary ROS: negative Musculoskeletal ROS: positive for - muscle pain Neurological ROS: positive for spasticity   Prior Functional Status: Prior Functional Status: Pt reports being mod I with functional mobility PTA. Pt primarily performs seated pivot transfers to power w/c. Per chart review, he has been non ambulatory since 2018. He reports having multiple falls in the past 6 months, able to return upright indep with some falls, but reports needing assistance with returning upright at times.  Current Functional Status: Therapy notes reviewed   Mobility:  Bed Mobility comments: sup <> sit with HOB elevated SBA     Ambulation comments: not assessed PT Post Acute Discharge Recommendations: 5x weekly, High intensity  Cognition, Swallow, Speech:  Cognition / Swallow / Speech Patient's Vision Adequate to Safely Complete Daily Activities: Yes Patient's Judgement Adequate to Safely Complete Daily Activities: Yes Patient's  Memory Adequate to Safely Complete Daily Activities:  Yes Patient Able to Express Needs/Desires: Yes Patient has speech problem: No        Assistive Devices: Wheelchair-manual, Wheelchair-power  Precautions: Safety Interventions Safety Interventions: bleeding precautions, fall reduction program maintained, lighting adjusted for tasks/safety, low bed, nonskid shoes/slippers when out of bed Aspiration Precautions: awake/alert before oral intake Bleeding Precautions: blood pressure closely monitored, gentle oral care promoted, monitored for signs of bleeding Infection Management: aseptic technique maintained  OBJECTIVE:  Vitals: Vitals:   05/05/24 1000 05/05/24 1015 05/05/24 1030 05/05/24 1045  BP: 118/73 126/73 123/77 121/69  Pulse: 84 86 88 86  Resp: 16     Temp:      TempSrc:      SpO2:      Weight:      Height:        Physical Exam:   GEN: Lying in bed in NAD. HEENT: Atraumatic. Normocephalic. Moist mucous membranes. Trachea midline. RESP: NWOB on RA. GI: nondistended PSYCH: Appropriate SKIN: see wound care flow sheet MSK: left transfemoral amputation, residual limb wrapped in dressing, no drainage strikethrough on bandage.  NEURO: Mental Status: awake, alert, attention intact, speech fluid and coherent, follows commands well and answers questions appropriately   Labs and Diagnostic Studies:   Personally reviewed  CBC - Results in Past 30 Days Result Component Current Result Ref Range Previous Result Ref Range  HCT 28.4 (L) (05/05/2024) 39.0 - 48.0 % 27.3 (L) (05/04/2024) 39.0 - 48.0 %  HGB 9.1 (L) (05/05/2024) 12.9 - 16.5 g/dL 8.5 (L) (88/0/7974) 87.0 - 16.5 g/dL  MCH 73.4 (88/89/7974) 25.9 - 32.4 pg 26.1 (05/04/2024) 25.9 - 32.4 pg  MCHC 32.2 (05/05/2024) 32.0 - 36.0 g/dL 68.9 (L) (88/0/7974) 67.9 - 36.0 g/dL  MCV 17.6 (88/89/7974) 77.6 - 95.7 fL 83.9 (05/04/2024) 77.6 - 95.7 fL  MPV 8.4 (05/05/2024) 6.8 - 10.7 fL 8.4 (05/04/2024) 6.8 - 10.7 fL  Platelet 474 (H) (05/05/2024) 150 - 450 10*9/L 456 (H) (05/04/2024) 150 -  450 10*9/L  RBC 3.45 (L) (05/05/2024) 4.26 - 5.60 10*12/L 3.26 (L) (05/04/2024) 4.26 - 5.60 10*12/L  WBC 8.7 (05/05/2024) 3.6 - 11.2 10*9/L 9.0 (05/04/2024) 3.6 - 11.2 10*9/L   BMP - Results in Past 30 Days Result Component Current Result Ref Range Previous Result Ref Range  BUN 45 (H) (05/05/2024) 9 - 23 mg/dL 33 (H) (88/0/7974) 9 - 23 mg/dL  Chloride 94 (L) (88/89/7974) 98 - 107 mmol/L 96 (L) (05/04/2024) 98 - 107 mmol/L  CO2 26.0 (05/05/2024) 20.0 - 31.0 mmol/L 26.0 (05/04/2024) 20.0 - 31.0 mmol/L  Creatinine 9.40 (H) (05/05/2024) 0.73 - 1.18 mg/dL 2.57 (H) (88/0/7974) 9.26 - 1.18 mg/dL  Glucose 809 (H) (88/89/7974) 70 - 179 mg/dL 853 (88/0/7974) 70 - 820 mg/dL  Potassium 4.9 (H) (88/89/7974) 3.4 - 4.8 mmol/L 4.2 (05/04/2024) 3.4 - 4.8 mmol/L  Sodium 139 (05/05/2024) 135 - 145 mmol/L 141 (05/04/2024) 135 - 145 mmol/L   LFT's - Results in Past 30 Days Result Component Current Result Ref Range Previous Result Ref Range  Albumin 3.2 (L) (04/27/2024) 3.4 - 5.0 g/dL Not in Time Range   Alkaline Phosphatase 94 (04/27/2024) 46 - 116 U/L Not in Time Range   ALT 19 (04/27/2024) 10 - 49 U/L Not in Time Range   AST 30 (04/27/2024) <=34 U/L Not in Time Range   Total Bilirubin <0.2 (L) (04/27/2024) 0.3 - 1.2 mg/dL Not in Time Range     Radiology Results:    No results found.  This encounter was a subsequent hospital care (follow up consultation).        [1] Past Medical History: Diagnosis Date  . Atrial fibrillation    (CMS-HCC) 12/13/2020   ECG 06-16-2015 noted Atrial Fib with RVR noted during ED to Adm after PEA arrest.  . Cardiac arrest (CMS-HCC) 05/2015  . Cardiomyopathy (CMS-HCC)   . DKA (diabetic ketoacidoses) 04/20/2016  . DVT (deep venous thrombosis) (CMS-HCC)   . ESRD (end stage renal disease) on dialysis    (CMS-HCC)    T Th Sat  . GERD (gastroesophageal reflux disease) 08/03/2021  . Hemiplegia and hemiparesis following cerebral infarction affecting left non-dominant side     (CMS-HCC) 08/08/2022  . Hypertension   . Myocardial infarction (CMS-HCC)   . Peripheral arterial occlusive disease   . 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 Stroke    (CMS-HCC) 05/2015   left sided weakness, gets around with a wheelchair since stroke  . Type 1 diabetes (CMS-HCC)    Dignosed age 28. 2 episodes of DKA in past  . Zoster ophthalmicus 04/13/2020  [2] Past Surgical History: Procedure Laterality Date  . AV FISTULA REPAIR  01/06/2019   Ballon angioplasty of stenotic basillic vein  . NASAL FRACTURE SURGERY     as teenager  . PR AMPUTATE THIGH,THRU FEMUR Left 04/29/2024   Procedure: AMPUTA THIGH THRU FEMUR ANY LEVEL;  Surgeon: Ave Bosworth, MD;  Location: OR [facility];  Service: Vascular  . PR AMPUTATION FOOT,TRANSMETATARSAL Left 03/23/2016   Procedure: AMPUTATION, FOOT; TRANSMETATARSAL;  Surgeon: Selinda Bernardino Baston, MD;  Location: MAIN [facility];  Service: Vascular  . PR AMPUTATION METATARSAL+TOE,SINGLE Left 01/14/2016   Procedure: AMPUTATION, METATARSAL, WITH TOE SINGLE. L 4th toe;  Surgeon: Bosworth Ave, MD;  Location: MAIN OR [facility];  Service: Vascular  . PR AV ANAST,FOREARM VEIN TRANSPOSITION Left 05/08/2016   Procedure: Left Radial Cephalic fisutal vs Brachial Cephalic Fistula;  Surgeon: Bosworth Ave, MD;  Location: MAIN OR [facility];  Service: Vascular  . PR CATH PLACE/CORON ANGIO, IMG SUPER/INTERP,W LEFT HEART VENTRICULOGRAPHY N/A 12/21/2015   Procedure: Left Heart Catheterization;  Surgeon: Suella Finders, MD;  Location: [facility] CATH;  Service: Cardiology  . PR CATH PLACE/CORON ANGIO, IMG SUPER/INTERP,W LEFT HEART VENTRICULOGRAPHY N/A 11/18/2020   Procedure: Left Heart Catheterization;  Surgeon: Norleen Deward Grumbling, MD;  Location: [facility] CATH;  Service: Cardiology  . PR DEBRIDEMENT BONE 1ST 20 SQ CM/< Left 03/23/2016   Procedure: DEBRIDEMENT; SKIN, SUBCUTANEOUS TISSUE, MUSCLE, & BONE FOOT;  Surgeon: Selinda Bernardino Baston, MD;  Location: MAIN OR [facility];  Service: Vascular  .  PR EXPLORATION N/FLWD SURG LOWER EXTREMITY ARTERY Left 03/26/2024   Procedure: EXPLORATION NOT FOLLOWED BY SURGICAL REPAIR, ARTERY; LOWER EXTREMITY (EG, COMMON FEMORAL, DEEP FEMORAL, SUPERFICIAL FEMORAL, POPLITEAL, TIBIAL, PERONEAL);  Surgeon: Ave Bosworth, MD;  Location: OR [facility];  Service: Vascular  . PR VEIN BYPASS GRAFT,FEM-TIBIAL Left 01/03/2016   Procedure: Fem-Peroneal Bypass with Ipsilateral GSV;  Surgeon: Bosworth Ave, MD;  Location: MAIN OR [facility];  Service: Vascular  . RSFA stent    [3] Social History Tobacco Use  . Smoking status: Every Day    Types: Cigars    Start date: 2019    Passive exposure: Past  . Smokeless tobacco: Never  . Tobacco comments:    1-2 Black and Mild cigars per day; relights each one multiple times. Has decided to quit.  Vaping Use  . Vaping status: Former  Substance Use Topics  . Alcohol use: No    Alcohol/week: 0.0 standard  drinks of alcohol    Comment: socially, very seldom  . Drug use: Yes    Types: Marijuana  [4] . acetaminophen  (TYLENOL ) tablet 1,000 mg Q8H  . aspirin  chewable tablet 81 mg Daily  . atorvastatin  (LIPITOR ) tablet 80 mg Daily  . clopidogrel  (PLAVIX ) tablet 75 mg Daily  . ezetimibe  (ZETIA ) tablet 10 mg Daily  . gabapentin  (NEURONTIN ) capsule 600 mg Nightly  . heparin  (porcine) 5,000 unit/mL injection 5,000 Units Q8H SCH  . isosorbide  mononitrate (IMDUR ) 24 hr tablet 30 mg Daily  . methocarbamol  (ROBAXIN ) tablet 500 mg QID  . metoPROLOL  tartrate (Lopressor ) tablet 50 mg BID  . mirtazapine  (REMERON ) tablet 30 mg Nightly  . pantoprazole  (Protonix ) EC tablet 40 mg Daily before breakfast  . sertraline  (ZOLOFT ) tablet 150 mg Daily  . sevelamer (RENVELA) tablet 800 mg 3xd Meals

## 2024-05-05 NOTE — Consults (Signed)
 Endocrine Team Diabetes Follow Up Consult Note   Consult information: Requesting Attending Physician : Lawayne Patience, MD Service Requesting Consult : Surg Vascular (SRV) Primary Care Provider: [name], DO Impression: Raymond Lamb is a 38 y.o. male admitted for worsening foot wound . We have been consulted at the request of Lawayne Patience, MD to evaluate Raymond Lamb for hyperglycemia.   Medical Decision Making: Diagnoses: 1.Type 1 Diabetes. Uncontrolled With severe hypoglycemia last 24 hours.  2. Nutrition: Complicating glycemic control. Increasing risk for both hypoglycemia and hyperglycemia. 3. Infection. Complicating glycemic control and increasing risk for hyperglycemia. 4. End Stage Renal Disease. Complicating glycemic control and increasing risk for hypoglycemia. 5. Coronary Artery Disease. Complicating glycemic control and increasing risk for hyperglycemia and hypoglycemia. 6. Peripheral Vascular Disease. Complicating glycemic control and increasing risk for hyperglycemia.  Studies reviewed 05/05/24: Labs: CBC, BMP, POCT-BG, and HbA1C Interpretation: WBCs normal. Anemia noted. Hyperkalemia. Elevated Cr with reduced GFR in setting of known ESRD. Hyperglycemia, some severe. A1C 9.4% (Outside records) indicates poor control outpatient. Notes reviewed: Primary team and nursing notes  Overall impression based on above reviews and history: Type 1 diabetes mostly meeting inpatient glucose targets with occasional hyperglycemia secondary to post prandial excursions.  Nursing to continue ACHS POC glucose checks while he remains inpatient.  Recommendations: - continue insulin  pump  - Hypoglycemia protocol. - POCT-BG achs. - Ensure patient is on glucose precautions if patient taking nutrition by mouth.    In case of pump failure: Lantus  8 units daily Lispro 3 units with meals  ISF target 140, ISF 40 achs   Discharge planning: In process. Will complete  actual plan closer to discharge.  Will discharge on home pump.  Thank you for this consult. Discussed plan with primary team. We will continue to follow and make recommendations and place orders as appropriate.  Please page with questions or concerns: April Goley, NP: [phone] DCT on call from 6AM - 3PM on weekdays then endocrine fellow on call: [phone] from 3PM - 6AM on weekdays and on weekends and holidays.  If APP cannot be reached, please page the endocrine fellow on call.  Subjective: Interval history: No events  Initial HPI: Raymond Lamb is a 38 y.o. male with past medical history of peripheral arterial disease, LUE DVT, T1DM, ESRD (on HD M/W/F), HTN, anoxic brain injury 05/2015, and NSTEMI  admitted for worsening foot wound.   Diabetes History: Patient has a history of Type 1 diabetes. Diabetes is managed by: Dr. Carlin at [facility]. Current home diabetes regimen:  OmniPod 5 in auto mode Basal 0.35 ICR 1 (does not carb count. Estimates units needed per meal)  ISF 80 Target 130   In case of pump failure: Lantus  8 units daily Lispro ~3 units with meals  ISF 1:50   Current home blood glucose monitoring dexcom G7. Hypoglycemia awareness: Unclear. Has a history of severe hypo requiring EMS and glucagon. Complications related to diabetes: retinopathy and ESRD on dialysis   Current Nutrition: Active Orders  Diet   Nutrition Therapy Consistent Carb; Consistent Carb 60/60/60 (4/4/4)    ROS: As per HPI.  Scheduled Medications[1]  Current Outpatient Medications  Medication Instructions  . alcohol swabs PadM 1 each, Topical (Top), 4 times a day  . aspirin  (ECOTRIN) 81 mg, Oral, Daily (standard)  . atorvastatin  (LIPITOR ) 80 mg, Oral, Daily (standard)  . blood-glucose meter,continuous (DEXCOM G6 RECEIVER) Misc Dispense 1 Dexcom G6 receiver  . blood-glucose meter,continuous (DEXCOM G7 RECEIVER) Misc Use as instructed with  G7 sensors  . blood-glucose sensor (DEXCOM G6  SENSOR) Devi Dispense 1 Dexcom G6 sensor every 10 days  . blood-glucose transmitter (DEXCOM G6 TRANSMITTER) Devi Dispense 1 Dexcom G6 transmitter every 90 days  . cholecalciferol, vitamin D3-50 mcg, 2,000 unit,, 50 mcg (2,000 unit) tablet 1 tablet, Daily (standard)  . cinacalcet  (SENSIPAR ) 90 mg, Oral, Daily (standard)  . clopidogrel  (PLAVIX ) 75 mg, Oral, Daily (standard)  . ezetimibe  (ZETIA ) 10 mg, Oral, Daily (standard)  . ferric citrate  (AURYXIA ) 210 mg iron  Tab tablet Take two tablets by mouth with meals, three times a day. Swallow whole.  Do not chew or crush medication.  . gabapentin  (NEURONTIN ) 600 mg, Oral, Nightly  . glucagon spray 3 mg/actuation Spry Use 1 spray in 1 nostril for severe hypoglycemia, as per package instructions  . HumaLOG KwikPen Insulin  10 Units, Subcutaneous, 3 times a day (AC)  . insulin  lispro (HUMALOG) 100 unit/mL injection Vials to be used in the pump to allow up to 50 units per day.  . insulin  pmp cart,aut,G6/7,cntr (OMNIPOD 5 G6-G7 INTRO KT,GEN5,) Crtg INTRO KIT CONTAIN CONTROLLER AND PODS, TO ALLOW POD CHANGE EVERY 2 DAYS  . insulin  pump cart,auto,BT,G6/7 (OMNIPOD 5 G6-G7 PODS, GEN 5,) Crtg Change pod every 2 days. Pod must be compatible with Dexcom G 6 and G 7.  . insulin  syringe-needle U-100 0.5 mL 31 gauge x 5/16 (8 mm) Syrg Use with insulin  Vials, 4 times per day before each meal or as needed to correct sugar.  . ipratropium (ATROVENT ) 42 mcg (0.06 %) nasal spray 2 sprays, Each Nare, 4 times a day  . isosorbide  mononitrate (IMDUR ) 30 mg, Oral, Daily (standard)  . lancets Misc use to test blood sugar Four (4) times a day with a meal and nightly.  . lancets Misc Use to check blood sugar, up to 5 times a day  . lidocaine  2 % Soln 15 mL, Oral, Every 3 hours PRN  . methocarbamol  (ROBAXIN ) 1,000 mg, Oral, 2 times a day (standard)  . metoPROLOL  succinate (TOPROL  XL) 100 mg, Oral, Daily (standard)  . mirtazapine  (REMERON ) 30 mg, Oral, Nightly  . naloxone (NARCAN)  4 mg, Alternating Nares, Once as needed, One spray in either nostril once for known/suspected opioid overdose. May repeat every 2-3 minutes in alternating nostril til EMS arrives  . OMNIPOD 5 PACK PODS Use 1 Pod every 3 days  . ondansetron  (ZOFRAN -ODT) 4 mg, orally disintegrating tablet, Every 6 hours PRN  . oxyCODONE  (ROXICODONE ) 5 mg, Oral, Every 4 hours PRN  . pantoprazole  (PROTONIX ) 40 mg, Oral, Daily before breakfast  . patiromer  calcium  sorbitex (VELTASSA ) 8.4 gram powder packet Take 1 packet daily on non-dialysis days only  . pen needle, diabetic (UNIFINE PENTIPS) 32 gauge x 5/32 (4 mm) Ndle Use to give insulin  5 times a day  . predniSONE (DELTASONE) 50 mg, Oral, Take as directed, Give 13 hours, 7 hours and 1 hour before contrast procedure.  . sertraline  (ZOLOFT ) 150 mg, Oral, Daily (standard)  . sucralfate (CARAFATE) 1 g, Oral, 4 times a day PRN  . tizanidine (ZANAFLEX) 2 MG tablet TAKE 2 TABLETS BY MOUTH TWICE DAILY AS NEEDED FOR MUSCLE SPASM  . triamcinolone (KENALOG) 0.1 % ointment Topical, 2 times a day (standard), Put on the scabby areas on the legs      Past Medical History[2]  Past Surgical History[3]  Family History[4]  Short Social History[5]  OBJECTIVE: BP 121/69   Pulse 86   Temp 36.8 C (98.3 F) (Oral)  Resp 16   Ht 175.3 cm (5' 9)   Wt 63.7 kg (140 lb 6.9 oz)   SpO2 97%   BMI 20.74 kg/m  Wt Readings from Last 12 Encounters:  05/03/24 63.7 kg (140 lb 6.9 oz)  04/04/24 70.3 kg (155 lb)  03/26/24 70.3 kg (155 lb)  02/21/24 68 kg (150 lb)  01/29/24 66.2 kg (146 lb)  01/21/24 66.2 kg (146 lb)  01/15/24 (P) 65.3 kg (144 lb)  12/19/23 65.3 kg (144 lb)  12/06/23 66 kg (145 lb 8.1 oz)  10/16/23 65.8 kg (145 lb)  09/21/23 65.8 kg (145 lb)  09/14/23 66 kg (145 lb 9.6 oz)     Physical Exam Vitals and nursing note reviewed.  Constitutional:      Appearance: He is not ill-appearing.  HENT:     Head: Normocephalic and atraumatic.  Cardiovascular:      Rate and Rhythm: Normal rate.  Pulmonary:     Effort: Pulmonary effort is normal. No respiratory distress.  Skin:    General: Skin is warm and dry.  Neurological:     General: No focal deficit present.     Mental Status: He is alert and oriented to person, place, and time.       BG/insulin  reviewed per EMR.  Glucose, POC (mg/dL)  Date Value  88/89/7974 154  05/04/2024 94  05/04/2024 216 (H)  05/04/2024 223 (H)  05/04/2024 137  05/04/2024 147  05/03/2024 208 (H)  05/03/2024 200 (H)     Summary of labs: Lab Results  Component Value Date   A1C 7.6 (H) 12/11/2023   A1C 8.6 (H) 09/10/2023   A1C 7.8 (H) 06/01/2023   Lab Results  Component Value Date   CREATININE 9.40 (H) 05/05/2024   Lab Results  Component Value Date   WBC 8.7 05/05/2024   HGB 9.1 (L) 05/05/2024   HCT 28.4 (L) 05/05/2024   PLT 474 (H) 05/05/2024    Lab Results  Component Value Date   NA 139 05/05/2024   K 4.9 (H) 05/05/2024   CL 94 (L) 05/05/2024   CO2 26.0 05/05/2024   BUN 45 (H) 05/05/2024   CREATININE 9.40 (H) 05/05/2024   GLU 190 (H) 05/05/2024   CALCIUM  9.6 05/05/2024   MG 2.0 05/05/2024   PHOS 6.2 (H) 05/05/2024    Lab Results  Component Value Date   BILITOT <0.2 (L) 04/27/2024   BILIDIR <0.10 11/20/2020   PROT 8.5 (H) 04/27/2024   ALBUMIN 3.2 (L) 04/27/2024   ALT 19 04/27/2024   AST 30 04/27/2024   ALKPHOS 94 04/27/2024   GGT 20 09/10/2017                     [1] . acetaminophen   1,000 mg Oral Q8H  . aspirin   81 mg Oral Daily  . atorvastatin   80 mg Oral Daily  . clopidogrel   75 mg Oral Daily  . ezetimibe   10 mg Oral Daily  . gabapentin   600 mg Oral Nightly  . heparin  (porcine) for subcutaneous use  5,000 Units Subcutaneous Q8H SCH  . isosorbide  mononitrate  30 mg Oral Daily  . methocarbamol   500 mg Oral QID  . metoPROLOL  tartrate  50 mg Oral BID  . mirtazapine   30 mg Oral Nightly  . pantoprazole   40 mg Oral Daily before breakfast  . sertraline   150  mg Oral Daily  . sevelamer  800 mg Oral 3xd Meals  [2] Past Medical History: Diagnosis Date  .  Atrial fibrillation    (CMS-HCC) 12/13/2020   ECG 06-16-2015 noted Atrial Fib with RVR noted during ED to Adm after PEA arrest.  . Cardiac arrest (CMS-HCC) 05/2015  . Cardiomyopathy (CMS-HCC)   . DKA (diabetic ketoacidoses) 04/20/2016  . DVT (deep venous thrombosis) (CMS-HCC)   . ESRD (end stage renal disease) on dialysis    (CMS-HCC)    T Th Sat  . GERD (gastroesophageal reflux disease) 08/03/2021  . Hemiplegia and hemiparesis following cerebral infarction affecting left non-dominant side    (CMS-HCC) 08/08/2022  . Hypertension   . Myocardial infarction (CMS-HCC)   . Peripheral arterial occlusive disease   . 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 Stroke    (CMS-HCC) 05/2015   left sided weakness, gets around with a wheelchair since stroke  . Type 1 diabetes (CMS-HCC)    Dignosed age 60. 2 episodes of DKA in past  . 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 AV FISTULA REPAIR  01/06/2019   Ballon angioplasty of stenotic basillic vein  . NASAL FRACTURE SURGERY     as teenager  . PR AMPUTATE THIGH,THRU FEMUR Left 04/29/2024   Procedure: AMPUTA THIGH THRU FEMUR ANY LEVEL;  Surgeon: Ave Bosworth, MD;  Location: OR [facility];  Service: Vascular  . PR AMPUTATION FOOT,TRANSMETATARSAL Left 03/23/2016   Procedure: AMPUTATION, FOOT; TRANSMETATARSAL;  Surgeon: Selinda Bernardino Baston, MD;  Location: MAIN OR [facility];  Service: Vascular  . PR AMPUTATION METATARSAL+TOE,SINGLE Left 01/14/2016   Procedure: AMPUTATION, METATARSAL, WITH TOE SINGLE. L 4th toe;  Surgeon: Bosworth Ave, MD;  Location: MAIN [facility];  Service: Vascular  . PR AV ANAST,FOREARM VEIN TRANSPOSITION Left 05/08/2016   Procedure: Left Radial Cephalic fisutal vs Brachial Cephalic Fistula;  Surgeon: Bosworth Ave, MD;  Location: MAIN OR [facility];  Service: Vascular  . PR CATH  PLACE/CORON ANGIO, IMG SUPER/INTERP,W LEFT HEART VENTRICULOGRAPHY N/A 12/21/2015   Procedure: Left Heart Catheterization;  Surgeon: Suella Finders, MD;  Location: [facility] CATH;  Service: Cardiology  . PR CATH PLACE/CORON ANGIO, IMG SUPER/INTERP,W LEFT HEART VENTRICULOGRAPHY N/A 11/18/2020   Procedure: Left Heart Catheterization;  Surgeon: Norleen Deward Grumbling, MD;  Location: [facility] CATH;  Service: Cardiology  . PR DEBRIDEMENT BONE 1ST 20 SQ CM/< Left 03/23/2016   Procedure: DEBRIDEMENT; SKIN, SUBCUTANEOUS TISSUE, MUSCLE, & BONE FOOT;  Surgeon: Selinda Bernardino Baston, MD;  Location: MAIN [facility];  Service: Vascular  . PR EXPLORATION N/FLWD SURG LOWER EXTREMITY ARTERY Left 03/26/2024   Procedure: EXPLORATION NOT FOLLOWED BY SURGICAL REPAIR, ARTERY; LOWER EXTREMITY (EG, COMMON FEMORAL, DEEP FEMORAL, SUPERFICIAL FEMORAL, POPLITEAL, TIBIAL, PERONEAL);  Surgeon: Ave Bosworth, MD;  Location: OR [facility];  Service: Vascular  . PR VEIN BYPASS GRAFT,FEM-TIBIAL Left 01/03/2016   Procedure: Fem-Peroneal Bypass with Ipsilateral GSV;  Surgeon: Bosworth Ave, MD;  Location: MAIN OR [facility];  Service: Vascular  . RSFA stent    [4] Family History Problem Relation Age of Onset  . Cancer Paternal Uncle        brain  . Diabetes Maternal Grandmother   . Hypertension Maternal Grandmother   . Diabetes Paternal Grandmother   . Hypertension Paternal Grandmother   . Heart disease Paternal Grandfather   . COPD Paternal Grandfather        Lung cancer  . Kidney disease Other        multiple family members  . Hypertension Other        multiple family members  . Diabetes type I Other  on both sides of family  . Lung cancer Other        grandmother  . Clotting disorder Neg Hx   . Anesthesia problems Neg Hx   . Glaucoma Neg Hx   . Macular degeneration Neg Hx   . Bleeding Disorder Neg Hx   [5] Social History Tobacco Use  . Smoking status: Every Day    Types: Cigars    Start date: 2019    Passive exposure: Past  .  Smokeless tobacco: Never  . Tobacco comments:    1-2 Black and Mild cigars per day; relights each one multiple times. Has decided to quit.  Vaping Use  . Vaping status: Former  Substance Use Topics  . Alcohol use: No    Alcohol/week: 0.0 standard drinks of alcohol    Comment: socially, very seldom  . Drug use: Yes    Types: Marijuana

## 2024-05-07 NOTE — Consults (Signed)
 Endocrine Team Diabetes Follow Up Consult Note   Consult information: Requesting Attending Physician : Lawayne Patience, MD Service Requesting Consult : Surg Vascular (SRV) Primary Care Provider: [name], DO Impression: Raymond Lamb is a 38 y.o. male admitted for worsening foot wound . We have been consulted at the request of Lawayne Patience, MD to evaluate Raymond Lamb for hyperglycemia.   Medical Decision Making: Diagnoses: 1.Type 1 Diabetes. Uncontrolled With hyperglycemia. 2. Nutrition: Complicating glycemic control. Increasing risk for both hypoglycemia and hyperglycemia. 3. Infection. Complicating glycemic control and increasing risk for hyperglycemia. 4. End Stage Renal Disease. Complicating glycemic control and increasing risk for hypoglycemia. 5. Coronary Artery Disease. Complicating glycemic control and increasing risk for hyperglycemia and hypoglycemia. 6. Peripheral Vascular Disease. Complicating glycemic control and increasing risk for hyperglycemia.  Studies reviewed 05/07/24: Labs: CBC, BMP, POCT-BG Interpretation: WBCs normal. Anemia noted. Normal sodium. Hyperkalemia. Elevated Cr with reduced GFR in setting of known ESRD. Intermittent hyperglycemia. Notes reviewed: Primary team and nursing notes  Overall impression based on above reviews and history: Type 1 diabetes mostly meeting inpatient glucose targets with occasional hyperglycemia secondary to post prandial excursions.  Nursing to continue ACHS POC glucose checks while he remains inpatient and self-managing with pump.  Recommendations: - Continue insulin  pump. Please see subcutaneous insulin  recommendations below should mental status decline and he is no longer able to self-manage via pump. - Hypoglycemia protocol. - POCT-BG achs. - Ensure patient is on glucose precautions if patient taking nutrition by mouth.    In case of pump failure: Lantus  8 units daily Lispro 3 units with meals   ISF target 140, ISF 40 achs   Discharge planning: In process. Will complete actual plan closer to discharge.  Will discharge on home pump.  Thank you for this consult. Discussed plan with primary team. We will continue to follow and make recommendations and place orders as appropriate.  Please page with questions or concerns: April Goley, NP: [phone] DCT on call from 6AM - 3PM on weekdays then endocrine fellow on call: [phone] from 3PM - 6AM on weekdays and on weekends and holidays.  If APP cannot be reached, please page the endocrine fellow on call.  Subjective: Interval history: No events; dialysis today. Initial HPI: Raymond Lamb is a 38 y.o. male with past medical history of peripheral arterial disease, LUE DVT, T1DM, ESRD (on HD M/W/F), HTN, anoxic brain injury 05/2015, and NSTEMI  admitted for worsening foot wound.   Diabetes History: Patient has a history of Type 1 diabetes. Diabetes is managed by: [facility]. Carlin at [facility]. Current home diabetes regimen:  OmniPod 5 in auto mode Basal 0.35 ICR 1 (does not carb count. Estimates units needed per meal)  ISF 80 Target 130   In case of pump failure: Lantus  8 units daily Lispro ~3 units with meals  ISF 1:50   Current home blood glucose monitoring dexcom G7. Hypoglycemia awareness: Unclear. Has a history of severe hypo requiring EMS and glucagon. Complications related to diabetes: retinopathy and ESRD on dialysis   Current Nutrition: Active Orders  Diet   Nutrition Therapy Consistent Carb; Consistent Carb 60/60/60 (4/4/4)    ROS: As per HPI.  Scheduled Medications[1]  Current Outpatient Medications  Medication Instructions  . alcohol swabs PadM 1 each, Topical (Top), 4 times a day  . aspirin  (ECOTRIN) 81 mg, Oral, Daily (standard)  . atorvastatin  (LIPITOR ) 80 mg, Oral, Daily (standard)  . blood-glucose meter,continuous (DEXCOM G6 RECEIVER) Misc Dispense 1 Dexcom G6 receiver  .  blood-glucose  meter,continuous (DEXCOM G7 RECEIVER) Misc Use as instructed with G7 sensors  . blood-glucose sensor (DEXCOM G6 SENSOR) Devi Dispense 1 Dexcom G6 sensor every 10 days  . blood-glucose transmitter (DEXCOM G6 TRANSMITTER) Devi Dispense 1 Dexcom G6 transmitter every 90 days  . cholecalciferol, vitamin D3-50 mcg, 2,000 unit,, 50 mcg (2,000 unit) tablet 1 tablet, Daily (standard)  . cinacalcet  (SENSIPAR ) 90 mg, Oral, Daily (standard)  . clopidogrel  (PLAVIX ) 75 mg, Oral, Daily (standard)  . ezetimibe  (ZETIA ) 10 mg, Oral, Daily (standard)  . ferric citrate  (AURYXIA ) 210 mg iron  Tab tablet Take two tablets by mouth with meals, three times a day. Swallow whole.  Do not chew or crush medication.  . gabapentin  (NEURONTIN ) 600 mg, Oral, Nightly  . glucagon spray 3 mg/actuation Spry Use 1 spray in 1 nostril for severe hypoglycemia, as per package instructions  . HumaLOG KwikPen Insulin  10 Units, Subcutaneous, 3 times a day (AC)  . insulin  lispro (HUMALOG) 100 unit/mL injection Vials to be used in the pump to allow up to 50 units per day.  . insulin  pmp cart,aut,G6/7,cntr (OMNIPOD 5 G6-G7 INTRO KT,GEN5,) Crtg INTRO KIT CONTAIN CONTROLLER AND PODS, TO ALLOW POD CHANGE EVERY 2 DAYS  . insulin  pump cart,auto,BT,G6/7 (OMNIPOD 5 G6-G7 PODS, GEN 5,) Crtg Change pod every 2 days. Pod must be compatible with Dexcom G 6 and G 7.  . insulin  syringe-needle U-100 0.5 mL 31 gauge x 5/16 (8 mm) Syrg Use with insulin  Vials, 4 times per day before each meal or as needed to correct sugar.  . ipratropium (ATROVENT ) 42 mcg (0.06 %) nasal spray 2 sprays, Each Nare, 4 times a day  . isosorbide  mononitrate (IMDUR ) 30 mg, Oral, Daily (standard)  . lancets Misc use to test blood sugar Four (4) times a day with a meal and nightly.  . lancets Misc Use to check blood sugar, up to 5 times a day  . lidocaine  2 % Soln 15 mL, Oral, Every 3 hours PRN  . methocarbamol  (ROBAXIN ) 1,000 mg, Oral, 2 times a day (standard)  . metoPROLOL   succinate (TOPROL  XL) 100 mg, Oral, Daily (standard)  . mirtazapine  (REMERON ) 30 mg, Oral, Nightly  . naloxone (NARCAN) 4 mg, Alternating Nares, Once as needed, One spray in either nostril once for known/suspected opioid overdose. May repeat every 2-3 minutes in alternating nostril til EMS arrives  . OMNIPOD 5 PACK PODS Use 1 Pod every 3 days  . ondansetron  (ZOFRAN -ODT) 4 mg, orally disintegrating tablet, Every 6 hours PRN  . oxyCODONE  (ROXICODONE ) 5 mg, Oral, Every 4 hours PRN  . pantoprazole  (PROTONIX ) 40 mg, Oral, Daily before breakfast  . patiromer  calcium  sorbitex (VELTASSA ) 8.4 gram powder packet Take 1 packet daily on non-dialysis days only  . pen needle, diabetic (UNIFINE PENTIPS) 32 gauge x 5/32 (4 mm) Ndle Use to give insulin  5 times a day  . predniSONE (DELTASONE) 50 mg, Oral, Take as directed, Give 13 hours, 7 hours and 1 hour before contrast procedure.  . sertraline  (ZOLOFT ) 150 mg, Oral, Daily (standard)  . sucralfate (CARAFATE) 1 g, Oral, 4 times a day PRN  . tizanidine (ZANAFLEX) 2 MG tablet TAKE 2 TABLETS BY MOUTH TWICE DAILY AS NEEDED FOR MUSCLE SPASM  . triamcinolone (KENALOG) 0.1 % ointment Topical, 2 times a day (standard), Put on the scabby areas on the legs      Past Medical History[2]  Past Surgical History[3]  Family History[4]  Short Social History[5]  OBJECTIVE: BP 83/46   Pulse 89  Temp 36.8 C (98.2 F) (Oral)   Resp 17   Ht 175.3 cm (5' 9)   Wt 63.7 kg (140 lb 6.9 oz)   SpO2 99%   BMI 20.74 kg/m  Wt Readings from Last 12 Encounters:  05/03/24 63.7 kg (140 lb 6.9 oz)  04/04/24 70.3 kg (155 lb)  03/26/24 70.3 kg (155 lb)  02/21/24 68 kg (150 lb)  01/29/24 66.2 kg (146 lb)  01/21/24 66.2 kg (146 lb)  01/15/24 (P) 65.3 kg (144 lb)  12/19/23 65.3 kg (144 lb)  12/06/23 66 kg (145 lb 8.1 oz)  10/16/23 65.8 kg (145 lb)  09/21/23 65.8 kg (145 lb)  09/14/23 66 kg (145 lb 9.6 oz)     Physical Exam Constitutional:      General: He is not in  acute distress.    Appearance: He is normal weight. He is not toxic-appearing.  Pulmonary:     Effort: Pulmonary effort is normal. No respiratory distress.  Skin:    Comments: Omnipod and Dexcom in place on R thigh.  Neurological:     Mental Status: He is alert. Mental status is at baseline.  Psychiatric:        Mood and Affect: Mood normal.        Behavior: Behavior normal.       BG/insulin  reviewed per EMR.  Glucose, POC (mg/dL)  Date Value  88/87/7974 189 (H)  05/07/2024 199 (H)  05/06/2024 146  05/06/2024 238 (H)  05/06/2024 222 (H)  05/06/2024 176  05/05/2024 213 (H)  05/05/2024 265 (H)     Summary of labs: Lab Results  Component Value Date   A1C 7.6 (H) 12/11/2023   A1C 8.6 (H) 09/10/2023   A1C 7.8 (H) 06/01/2023   Lab Results  Component Value Date   CREATININE 9.38 (H) 05/07/2024   Lab Results  Component Value Date   WBC 10.1 05/07/2024   HGB 9.3 (L) 05/07/2024   HCT 29.6 (L) 05/07/2024   PLT 505 (H) 05/07/2024    Lab Results  Component Value Date   NA 137 05/07/2024   K 5.0 (H) 05/07/2024   CL 92 (L) 05/07/2024   CO2 26.0 05/07/2024   BUN 50 (H) 05/07/2024   CREATININE 9.38 (H) 05/07/2024   GLU 188 (H) 05/07/2024   CALCIUM  9.9 05/07/2024   MG 2.0 05/07/2024   PHOS 5.8 (H) 05/07/2024    Lab Results  Component Value Date   BILITOT <0.2 (L) 04/27/2024   BILIDIR <0.10 11/20/2020   PROT 8.5 (H) 04/27/2024   ALBUMIN 3.2 (L) 04/27/2024   ALT 19 04/27/2024   AST 30 04/27/2024   ALKPHOS 94 04/27/2024   GGT 20 09/10/2017         [1] . acetaminophen   1,000 mg Oral Q8H  . aspirin   81 mg Oral Daily  . atorvastatin   80 mg Oral Daily  . clopidogrel   75 mg Oral Daily  . ezetimibe   10 mg Oral Daily  . gabapentin   600 mg Oral Nightly  . heparin  (porcine) for subcutaneous use  5,000 Units Subcutaneous Q8H SCH  . isosorbide  mononitrate  30 mg Oral Daily  . methocarbamol   500 mg Oral QID  . metoPROLOL  tartrate  50 mg Oral BID  . mirtazapine    30 mg Oral Nightly  . pantoprazole   40 mg Oral Daily before breakfast  . sertraline   150 mg Oral Daily  . sevelamer  800 mg Oral 3xd Meals  [2] Past Medical History: Diagnosis Date  .  Atrial fibrillation    (CMS-HCC) 12/13/2020   ECG 06-16-2015 noted Atrial Fib with RVR noted during ED to Adm after PEA arrest.  . Cardiac arrest (CMS-HCC) 05/2015  . Cardiomyopathy (CMS-HCC)   . DKA (diabetic ketoacidoses) 04/20/2016  . DVT (deep venous thrombosis) (CMS-HCC)   . ESRD (end stage renal disease) on dialysis    (CMS-HCC)    T Th Sat  . GERD (gastroesophageal reflux disease) 08/03/2021  . Hemiplegia and hemiparesis following cerebral infarction affecting left non-dominant side    (CMS-HCC) 08/08/2022  . Hypertension   . Myocardial infarction (CMS-HCC)   . Peripheral arterial occlusive disease   . 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 Stroke    (CMS-HCC) 05/2015   left sided weakness, gets around with a wheelchair since stroke  . Type 1 diabetes (CMS-HCC)    Dignosed age 16. 2 episodes of DKA in past  . 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 AV FISTULA REPAIR  01/06/2019   Ballon angioplasty of stenotic basillic vein  . NASAL FRACTURE SURGERY     as teenager  . PR AMPUTATE THIGH,THRU FEMUR Left 04/29/2024   Procedure: AMPUTA THIGH THRU FEMUR ANY LEVEL;  Surgeon: Ave Bosworth, MD;  Location: OR [facility];  Service: Vascular  . PR AMPUTATION FOOT,TRANSMETATARSAL Left 03/23/2016   Procedure: AMPUTATION, FOOT; TRANSMETATARSAL;  Surgeon: Selinda Bernardino Baston, MD;  Location: MAIN OR [facility];  Service: Vascular  . PR AMPUTATION METATARSAL+TOE,SINGLE Left 01/14/2016   Procedure: AMPUTATION, METATARSAL, WITH TOE SINGLE. L 4th toe;  Surgeon: Bosworth Ave, MD;  Location: MAIN [facility];  Service: Vascular  . PR AV ANAST,FOREARM VEIN TRANSPOSITION Left 05/08/2016   Procedure: Left Radial Cephalic fisutal vs Brachial Cephalic Fistula;   Surgeon: Bosworth Ave, MD;  Location: MAIN OR [facility];  Service: Vascular  . PR CATH PLACE/CORON ANGIO, IMG SUPER/INTERP,W LEFT HEART VENTRICULOGRAPHY N/A 12/21/2015   Procedure: Left Heart Catheterization;  Surgeon: Suella Finders, MD;  Location: [facility] CATH;  Service: Cardiology  . PR CATH PLACE/CORON ANGIO, IMG SUPER/INTERP,W LEFT HEART VENTRICULOGRAPHY N/A 11/18/2020   Procedure: Left Heart Catheterization;  Surgeon: Norleen Deward Grumbling, MD;  Location: [facility] CATH;  Service: Cardiology  . PR DEBRIDEMENT BONE 1ST 20 SQ CM/< Left 03/23/2016   Procedure: DEBRIDEMENT; SKIN, SUBCUTANEOUS TISSUE, MUSCLE, & BONE FOOT;  Surgeon: Selinda Bernardino Baston, MD;  Location: MAIN OR [facility];  Service: Vascular  . PR EXPLORATION N/FLWD SURG LOWER EXTREMITY ARTERY Left 03/26/2024   Procedure: EXPLORATION NOT FOLLOWED BY SURGICAL REPAIR, ARTERY; LOWER EXTREMITY (EG, COMMON FEMORAL, DEEP FEMORAL, SUPERFICIAL FEMORAL, POPLITEAL, TIBIAL, PERONEAL);  Surgeon: Ave Bosworth, MD;  Location: OR [facility];  Service: Vascular  . PR VEIN BYPASS GRAFT,FEM-TIBIAL Left 01/03/2016   Procedure: Fem-Peroneal Bypass with Ipsilateral GSV;  Surgeon: Bosworth Ave, MD;  Location: MAIN OR [facility];  Service: Vascular  . RSFA stent    [4] Family History Problem Relation Age of Onset  . Cancer Paternal Uncle        brain  . Diabetes Maternal Grandmother   . Hypertension Maternal Grandmother   . Diabetes Paternal Grandmother   . Hypertension Paternal Grandmother   . Heart disease Paternal Grandfather   . COPD Paternal Grandfather        Lung cancer  . Kidney disease Other        multiple family members  . Hypertension Other        multiple family members  . Diabetes type I Other  on both sides of family  . Lung cancer Other        grandmother  . Clotting disorder Neg Hx   . Anesthesia problems Neg Hx   . Glaucoma Neg Hx   . Macular degeneration Neg Hx   . Bleeding Disorder Neg Hx   [5] Social History Tobacco Use  . Smoking status:  Every Day    Types: Cigars    Start date: 2019    Passive exposure: Past  . Smokeless tobacco: Never  . Tobacco comments:    1-2 Black and Mild cigars per day; relights each one multiple times. Has decided to quit.  Vaping Use  . Vaping status: Former  Substance Use Topics  . Alcohol use: No    Alcohol/week: 0.0 standard drinks of alcohol    Comment: socially, very seldom  . Drug use: Yes    Types: Marijuana

## 2024-05-07 NOTE — Procedures (Signed)
 [facility] Nephrology Hemodialysis Procedure Note   05/07/2024  Raymond Lamb was seen and examined on hemodialysis  CHIEF COMPLAINT: End Stage Renal Disease  INTERVAL HISTORY: No acute events. Tolerating dialysis well without cramping or IDH.  DIALYSIS TREATMENT DATA: Estimated Dry Weight (kg): 63.9 kg (140 lb 14 oz) Patient Goal Weight (kg): 2 kg (4 lb 6.6 oz)  Pre-Treatment Weight (kg):  (utw, foot pain)   Dialysis Bath Bath: 2 K+ / 2.5 Ca+ Dialysate Na (mEq/L): 137 mEq/L Dialysate HCO3 (mEq/L): 35 mEq/L Dialyzer: F-180 (98 mLs)  Blood Flow Rate (mL/min): 350 mL/min Dialysis Flow (mL/min): 800 mL/min  Machine Temperature (C): 36.5 C (97.7 F)    PHYSICAL EXAM: Vitals: Temp:  [36.4 C (97.5 F)-36.8 C (98.2 F)] 36.8 C (98.2 F) Pulse:  [79-93] 89 BP: (83-151)/(43-88) 116/84 MAP (mmHg):  [82-96] 95  General: fatigued, currently dialyzing in a Bed Pulmonary: normal respiratory effort Cardiovascular: regular rate and rhythm Extremities: no significant  edema Access: LUE AV fistula  LAB DATA: Lab Results  Component Value Date   NA 137 05/07/2024   K 5.0 (H) 05/07/2024   CL 92 (L) 05/07/2024   CO2 26.0 05/07/2024   BUN 50 (H) 05/07/2024   CREATININE 9.38 (H) 05/07/2024   CALCIUM  9.9 05/07/2024   MG 2.0 05/07/2024   PHOS 5.8 (H) 05/07/2024   ALBUMIN 3.2 (L) 04/27/2024    Lab Results  Component Value Date   HCT 29.6 (L) 05/07/2024   WBC 10.1 05/07/2024     ASSESSMENT/PLAN: End Stage Renal Disease on Intermittent Hemodialysis: UF goal: 1L as tolerated Adjust medications for a GFR <10 Avoid nephrotoxic agents Last HD Treatment:Started (05/07/24)   Bone Mineral Metabolism: Lab Results  Component Value Date   CALCIUM  9.9 05/07/2024   CALCIUM  9.6 05/06/2024   Lab Results  Component Value Date   ALBUMIN 3.2 (L) 04/27/2024   ALBUMIN 3.6 12/19/2023    Lab Results  Component Value Date   PHOS 5.8 (H) 05/07/2024   PHOS 4.8 05/06/2024   Lab Results   Component Value Date   PTH 194.4 (H) 06/23/2015   PTH 248.1 (H) 03/21/2015    Labs appropriate, no changes.  Anemia:  Lab Results  Component Value Date   HGB 9.3 (L) 05/07/2024   HGB 9.0 (L) 05/06/2024   HGB 9.1 (L) 05/05/2024   Iron  Saturation (%)  Date Value Ref Range Status  05/03/2024 20 20 - 55 % Final    Lab Results  Component Value Date   FERRITIN 209.0 07/09/2015     Continue intravenous Epogen/Retacrit 12,000 units with each treatment.  Vascular Access: Vascular Access functioning well - no need for intervention Blood Flow Rate (mL/min): 350 mL/min  IV Antibiotics to be administered at discharge: No  This procedure was fully reviewed with the patient and/or their decision-maker. The risks, benefits, and alternatives were discussed prior to the procedure. All questions were answered and written informed consent was obtained.  Amy MARLA Ku, MD [facility] Division of Nephrology & Hypertension

## 2024-05-07 NOTE — Nursing Note (Signed)
   05/07/24 1608  Reassessment  Current Discharge Risk other (see comments)  Has transportation been arranged?  No  Post Acute Facility needed at discharge? Yes  Post Acute Facility [facility] ([facility])  Facility (Name/Phone #) [facility]  Home Care/ Home Medical Equipment needed at discharge? No  Outpatient/Community Referrals needed for discharge? No  Patient and/or family were provided with choice of facilities / services that are available and appropriate to meet post hospital care needs? Yes  Reassessment Complete Yes     Care Management Reassessment   Estimated Discharge Date: 05/08/2024  Current Discharge Plan: Other: See notes below  Anticipated Changes: See notes below  Coordination of Care and Care Progression Notes: Patient discharge is pending expedited appeal for [facility]. CM will continue to monitor for discharge needs.

## 2024-05-08 NOTE — Consults (Signed)
 Endocrine Team Diabetes Follow Up Consult Note   Consult information: Requesting Attending Physician : Lawayne Patience, MD Service Requesting Consult : Surg Vascular (SRV) Primary Care Provider: [name], DO Impression: Raymond Lamb is a 38 y.o. male admitted for worsening foot wound . We have been consulted at the request of Lawayne Patience, MD to evaluate Raymond Lamb for hyperglycemia.   Medical Decision Making: Diagnoses: 1.Type 1 Diabetes. Uncontrolled With hyperglycemia. 2. Nutrition: Complicating glycemic control. Increasing risk for both hypoglycemia and hyperglycemia. 3. Infection. Complicating glycemic control and increasing risk for hyperglycemia. 4. End Stage Renal Disease. Complicating glycemic control and increasing risk for hypoglycemia. 5. Coronary Artery Disease. Complicating glycemic control and increasing risk for hyperglycemia and hypoglycemia. 6. Peripheral Vascular Disease. Complicating glycemic control and increasing risk for hyperglycemia.  Studies reviewed 05/08/24: Labs: CBC, BMP, POCT-BG Interpretation: WBCs normal. Anemia noted. Normal sodium. Hyperkalemia. Elevated Cr with reduced GFR in setting of known ESRD. Intermittent hyperglycemia. Notes reviewed: Primary team and nursing notes  Overall impression based on above reviews and history: Type 1 diabetes mostly meeting inpatient glucose targets with occasional hyperglycemia secondary to post prandial excursions.  Nursing to continue ACHS POC glucose checks while he remains inpatient and self-managing with pump.  Recommendations: -DC home on insulin  pump with current pump settings - Continue insulin  pump. Please see subcutaneous insulin  recommendations below should mental status decline and he is no longer able to self-manage via pump. - Hypoglycemia protocol. - POCT-BG achs. - Ensure patient is on glucose precautions if patient taking nutrition by mouth.    In case of pump  failure: Lantus  8 units daily Lispro 3 units with meals  ISF target 140, ISF 40 achs   Discharge planning: Will discharge on home pump.Settings.  Thank you for this consult. Discussed plan with primary team. We will continue to follow and make recommendations and place orders as appropriate.  Please page with questions or concerns: April Goley, NP: [phone] DCT on call from 6AM - 3PM on weekdays then endocrine fellow on call: [phone] from 3PM - 6AM on weekdays and on weekends and holidays.  If APP cannot be reached, please page the endocrine fellow on call.  Subjective: Interval history: No events; dialysis today. Initial HPI: Raymond Lamb is a 38 y.o. male with past medical history of peripheral arterial disease, LUE DVT, T1DM, ESRD (on HD M/W/F), HTN, anoxic brain injury 05/2015, and NSTEMI  admitted for worsening foot wound.   Diabetes History: Patient has a history of Type 1 diabetes. Diabetes is managed by: Dr. Carlin at [facility]. Current home diabetes regimen:  OmniPod 5 in auto mode Basal 0.35 ICR 1 (does not carb count. Estimates units needed per meal)  ISF 80 Target 130   In case of pump failure: Lantus  8 units daily Lispro ~3 units with meals  ISF 1:50   Current home blood glucose monitoring dexcom G7. Hypoglycemia awareness: Unclear. Has a history of severe hypo requiring EMS and glucagon. Complications related to diabetes: retinopathy and ESRD on dialysis   Current Nutrition: Active Orders  Diet   Nutrition Therapy Consistent Carb; Consistent Carb 60/60/60 (4/4/4)    ROS: As per HPI.  Scheduled Medications[1]  Current Outpatient Medications  Medication Instructions  . alcohol swabs PadM 1 each, Topical (Top), 4 times a day  . aspirin  (ECOTRIN) 81 mg, Oral, Daily (standard)  . atorvastatin  (LIPITOR ) 80 mg, Oral, Daily (standard)  . blood-glucose meter,continuous (DEXCOM G6 RECEIVER) Misc Dispense 1 Dexcom G6 receiver  . blood-glucose  meter,continuous (DEXCOM G7 RECEIVER) Misc Use as instructed with G7 sensors  . blood-glucose sensor (DEXCOM G6 SENSOR) Devi Dispense 1 Dexcom G6 sensor every 10 days  . blood-glucose transmitter (DEXCOM G6 TRANSMITTER) Devi Dispense 1 Dexcom G6 transmitter every 90 days  . cholecalciferol, vitamin D3-50 mcg, 2,000 unit,, 50 mcg (2,000 unit) tablet 1 tablet, Daily (standard)  . cinacalcet  (SENSIPAR ) 90 mg, Oral, Daily (standard)  . clopidogrel  (PLAVIX ) 75 mg, Oral, Daily (standard)  . ezetimibe  (ZETIA ) 10 mg, Oral, Daily (standard)  . ferric citrate  (AURYXIA ) 210 mg iron  Tab tablet Take two tablets by mouth with meals, three times a day. Swallow whole.  Do not chew or crush medication.  . gabapentin  (NEURONTIN ) 600 mg, Oral, Nightly  . glucagon spray 3 mg/actuation Spry Use 1 spray in 1 nostril for severe hypoglycemia, as per package instructions  . HumaLOG KwikPen Insulin  10 Units, Subcutaneous, 3 times a day (AC)  . insulin  lispro (HUMALOG) 100 unit/mL injection Vials to be used in the pump to allow up to 50 units per day.  . insulin  pmp cart,aut,G6/7,cntr (OMNIPOD 5 G6-G7 INTRO KT,GEN5,) Crtg INTRO KIT CONTAIN CONTROLLER AND PODS, TO ALLOW POD CHANGE EVERY 2 DAYS  . insulin  pump cart,auto,BT,G6/7 (OMNIPOD 5 G6-G7 PODS, GEN 5,) Crtg Change pod every 2 days. Pod must be compatible with Dexcom G 6 and G 7.  . insulin  syringe-needle U-100 0.5 mL 31 gauge x 5/16 (8 mm) Syrg Use with insulin  Vials, 4 times per day before each meal or as needed to correct sugar.  . ipratropium (ATROVENT ) 42 mcg (0.06 %) nasal spray 2 sprays, Each Nare, 4 times a day  . isosorbide  mononitrate (IMDUR ) 30 mg, Oral, Daily (standard)  . lancets Misc use to test blood sugar Four (4) times a day with a meal and nightly.  . lancets Misc Use to check blood sugar, up to 5 times a day  . lidocaine  2 % Soln 15 mL, Oral, Every 3 hours PRN  . methocarbamol  (ROBAXIN ) 1,000 mg, Oral, 2 times a day (standard)  . metoPROLOL   succinate (TOPROL  XL) 100 mg, Oral, Daily (standard)  . mirtazapine  (REMERON ) 30 mg, Oral, Nightly  . naloxone (NARCAN) 4 mg, Alternating Nares, Once as needed, One spray in either nostril once for known/suspected opioid overdose. May repeat every 2-3 minutes in alternating nostril til EMS arrives  . OMNIPOD 5 PACK PODS Use 1 Pod every 3 days  . ondansetron  (ZOFRAN -ODT) 4 mg, orally disintegrating tablet, Every 6 hours PRN  . oxyCODONE  (ROXICODONE ) 5 mg, Oral, Every 4 hours PRN  . pantoprazole  (PROTONIX ) 40 mg, Oral, Daily before breakfast  . patiromer  calcium  sorbitex (VELTASSA ) 8.4 gram powder packet Take 1 packet daily on non-dialysis days only  . pen needle, diabetic (UNIFINE PENTIPS) 32 gauge x 5/32 (4 mm) Ndle Use to give insulin  5 times a day  . predniSONE (DELTASONE) 50 mg, Oral, Take as directed, Give 13 hours, 7 hours and 1 hour before contrast procedure.  . sertraline  (ZOLOFT ) 150 mg, Oral, Daily (standard)  . sucralfate (CARAFATE) 1 g, Oral, 4 times a day PRN  . tizanidine (ZANAFLEX) 2 MG tablet TAKE 2 TABLETS BY MOUTH TWICE DAILY AS NEEDED FOR MUSCLE SPASM  . triamcinolone (KENALOG) 0.1 % ointment Topical, 2 times a day (standard), Put on the scabby areas on the legs      Past Medical History[2]  Past Surgical History[3]  Family History[4]  Short Social History[5]  OBJECTIVE: BP 139/88   Pulse 91  Temp 37.1 C (98.8 F) (Oral)   Resp 18   Ht 175.3 cm (5' 9)   Wt 63.7 kg (140 lb 6.9 oz)   SpO2 95%   BMI 20.74 kg/m  Wt Readings from Last 12 Encounters:  05/03/24 63.7 kg (140 lb 6.9 oz)  04/04/24 70.3 kg (155 lb)  03/26/24 70.3 kg (155 lb)  02/21/24 68 kg (150 lb)  01/29/24 66.2 kg (146 lb)  01/21/24 66.2 kg (146 lb)  01/15/24 (P) 65.3 kg (144 lb)  12/19/23 65.3 kg (144 lb)  12/06/23 66 kg (145 lb 8.1 oz)  10/16/23 65.8 kg (145 lb)  09/21/23 65.8 kg (145 lb)  09/14/23 66 kg (145 lb 9.6 oz)     Physical Exam Constitutional:      General: He is not in  acute distress.    Appearance: He is normal weight. He is not toxic-appearing.  Pulmonary:     Effort: Pulmonary effort is normal. No respiratory distress.  Skin:    Comments: Omnipod and Dexcom in place on R thigh.  Neurological:     Mental Status: He is alert. Mental status is at baseline.  Psychiatric:        Mood and Affect: Mood normal.        Behavior: Behavior normal.       BG/insulin  reviewed per EMR.  Glucose, POC (mg/dL)  Date Value  88/86/7974 192 (H)  05/07/2024 196 (H)  05/07/2024 131  05/07/2024 189 (H)  05/07/2024 199 (H)  05/06/2024 146  05/06/2024 238 (H)  05/06/2024 222 (H)     Summary of labs: Lab Results  Component Value Date   A1C 7.6 (H) 12/11/2023   A1C 8.6 (H) 09/10/2023   A1C 7.8 (H) 06/01/2023   Lab Results  Component Value Date   CREATININE 6.36 (H) 05/08/2024   Lab Results  Component Value Date   WBC 9.6 05/08/2024   HGB 10.3 (L) 05/08/2024   HCT 32.3 (L) 05/08/2024   PLT 521 (H) 05/08/2024    Lab Results  Component Value Date   NA 140 05/08/2024   K 4.6 05/08/2024   CL 96 (L) 05/08/2024   CO2 26.0 05/08/2024   BUN 33 (H) 05/08/2024   CREATININE 6.36 (H) 05/08/2024   GLU 127 05/08/2024   CALCIUM  10.3 05/08/2024   MG 2.0 05/08/2024   PHOS 4.8 05/08/2024    Lab Results  Component Value Date   BILITOT <0.2 (L) 04/27/2024   BILIDIR <0.10 11/20/2020   PROT 8.5 (H) 04/27/2024   ALBUMIN 3.2 (L) 04/27/2024   ALT 19 04/27/2024   AST 30 04/27/2024   ALKPHOS 94 04/27/2024   GGT 20 09/10/2017          [1] . acetaminophen   1,000 mg Oral Q8H  . aspirin   81 mg Oral Daily  . atorvastatin   80 mg Oral Daily  . clopidogrel   75 mg Oral Daily  . ezetimibe   10 mg Oral Daily  . gabapentin   600 mg Oral Nightly  . heparin  (porcine) for subcutaneous use  5,000 Units Subcutaneous Q8H SCH  . isosorbide  mononitrate  30 mg Oral Daily  . methocarbamol   500 mg Oral QID  . metoPROLOL  tartrate  50 mg Oral BID  . mirtazapine   30 mg  Oral Nightly  . pantoprazole   40 mg Oral Daily before breakfast  . sertraline   150 mg Oral Daily  . sevelamer  800 mg Oral 3xd Meals  [2] Past Medical History: Diagnosis Date  . Atrial  fibrillation    (CMS-HCC) 12/13/2020   ECG 06-16-2015 noted Atrial Fib with RVR noted during ED to Adm after PEA arrest.  . Cardiac arrest (CMS-HCC) 05/2015  . Cardiomyopathy (CMS-HCC)   . DKA (diabetic ketoacidoses) 04/20/2016  . DVT (deep venous thrombosis) (CMS-HCC)   . ESRD (end stage renal disease) on dialysis    (CMS-HCC)    T Th Sat  . GERD (gastroesophageal reflux disease) 08/03/2021  . Hemiplegia and hemiparesis following cerebral infarction affecting left non-dominant side    (CMS-HCC) 08/08/2022  . Hypertension   . Myocardial infarction (CMS-HCC)   . Peripheral arterial occlusive disease   . 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 Stroke    (CMS-HCC) 05/2015   left sided weakness, gets around with a wheelchair since stroke  . Type 1 diabetes (CMS-HCC)    Dignosed age 29. 2 episodes of DKA in past  . 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 AV FISTULA REPAIR  01/06/2019   Ballon angioplasty of stenotic basillic vein  . NASAL FRACTURE SURGERY     as teenager  . PR AMPUTATE THIGH,THRU FEMUR Left 04/29/2024   Procedure: AMPUTA THIGH THRU FEMUR ANY LEVEL;  Surgeon: Ave Bosworth, MD;  Location: OR [facility];  Service: Vascular  . PR AMPUTATION FOOT,TRANSMETATARSAL Left 03/23/2016   Procedure: AMPUTATION, FOOT; TRANSMETATARSAL;  Surgeon: Selinda Bernardino Baston, MD;  Location: MAIN OR [facility];  Service: Vascular  . PR AMPUTATION METATARSAL+TOE,SINGLE Left 01/14/2016   Procedure: AMPUTATION, METATARSAL, WITH TOE SINGLE. L 4th toe;  Surgeon: Bosworth Ave, MD;  Location: MAIN OR [facility];  Service: Vascular  . PR AV ANAST,FOREARM VEIN TRANSPOSITION Left 05/08/2016   Procedure: Left Radial Cephalic fisutal vs Brachial Cephalic Fistula;  Surgeon:  Bosworth Ave, MD;  Location: MAIN OR [facility];  Service: Vascular  . PR CATH PLACE/CORON ANGIO, IMG SUPER/INTERP,W LEFT HEART VENTRICULOGRAPHY N/A 12/21/2015   Procedure: Left Heart Catheterization;  Surgeon: Suella Finders, MD;  Location: [facility] CATH;  Service: Cardiology  . PR CATH PLACE/CORON ANGIO, IMG SUPER/INTERP,W LEFT HEART VENTRICULOGRAPHY N/A 11/18/2020   Procedure: Left Heart Catheterization;  Surgeon: Norleen Deward Grumbling, MD;  Location: [facility] CATH;  Service: Cardiology  . PR DEBRIDEMENT BONE 1ST 20 SQ CM/< Left 03/23/2016   Procedure: DEBRIDEMENT; SKIN, SUBCUTANEOUS TISSUE, MUSCLE, & BONE FOOT;  Surgeon: Selinda Bernardino Baston, MD;  Location: MAIN OR [facility];  Service: Vascular  . PR EXPLORATION N/FLWD SURG LOWER EXTREMITY ARTERY Left 03/26/2024   Procedure: EXPLORATION NOT FOLLOWED BY SURGICAL REPAIR, ARTERY; LOWER EXTREMITY (EG, COMMON FEMORAL, DEEP FEMORAL, SUPERFICIAL FEMORAL, POPLITEAL, TIBIAL, PERONEAL);  Surgeon: Ave Bosworth, MD;  Location: OR [facility];  Service: Vascular  . PR VEIN BYPASS GRAFT,FEM-TIBIAL Left 01/03/2016   Procedure: Fem-Peroneal Bypass with Ipsilateral GSV;  Surgeon: Bosworth Ave, MD;  Location: MAIN OR [facility];  Service: Vascular  . RSFA stent    [4] Family History Problem Relation Age of Onset  . Cancer Paternal Uncle        brain  . Diabetes Maternal Grandmother   . Hypertension Maternal Grandmother   . Diabetes Paternal Grandmother   . Hypertension Paternal Grandmother   . Heart disease Paternal Grandfather   . COPD Paternal Grandfather        Lung cancer  . Kidney disease Other        multiple family members  . Hypertension Other        multiple family members  . Diabetes type I Other  on both sides of family  . Lung cancer Other        grandmother  . Clotting disorder Neg Hx   . Anesthesia problems Neg Hx   . Glaucoma Neg Hx   . Macular degeneration Neg Hx   . Bleeding Disorder Neg Hx   [5] Social History Tobacco Use  . Smoking status: Every  Day    Types: Cigars    Start date: 2019    Passive exposure: Past  . Smokeless tobacco: Never  . Tobacco comments:    1-2 Black and Mild cigars per day; relights each one multiple times. Has decided to quit.  Vaping Use  . Vaping status: Former  Substance Use Topics  . Alcohol use: No    Alcohol/week: 0.0 standard drinks of alcohol    Comment: socially, very seldom  . Drug use: Yes    Types: Marijuana

## 2024-05-08 NOTE — Discharge Summary (Signed)
 ------------------------------------------------------------------------------- Attestation signed by Ave Bosworth, MD at 05/09/24 1024 I saw and evaluated the patient, participating in the key portions of the service.  I reviewed the resident's note.  I agree with the resident's findings and plan.   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 *   OR Procedures:   Left - AMPUTA THIGH THRU FEMUR ANY LEVEL Date 04/29/2024 -------------------   Ancillary Procedures: no procedures  Discharge Day Services: The patient was seen and examined by the Vascular Surgery Team on the day of discharge. Patient was medically stable and determined to be ready for discharge.  Subjective  POD 9 s/p L AKA. NAEO. AF and hemodynamically stable. BG 131-196 with insulin  pump. Patient received dialysis yesterday. Tolerating PO intake and pain well-controlled. Patient medically ready for discharge and feels comfortably discharging home with home health.   Objective  Patient Vitals for the past 8 hrs:  BP Temp Temp src Pulse Resp SpO2  05/08/24 1100 102/65 36.7 C (98.1 F) Oral 89 18 98 %  05/08/24 0814 139/88 37.1 C (98.8 F) Oral 91 18 95 %   I/O this  shift: In: 580 [P.O.:580] Out: -   Physical Exam General: Cooperative, no distress, well appearing Neurologic: Alert and interactive Pulmonary: Normal work of breathing on room air Cardiovascular: Regular rate Musculoskeletal: Moves all extremities. Stump dressing in place. No strikethrough.  Hospital Course:  Raymond Lamb is a 38 y.o. male with history of PAD w/extensive vascular intervention history, LUE DVT, T1DM, ESRD on MWF HD, HTN, anoxic brain injury, NSTEMI, that was admitted to the hospital on 04/27/2024. He was taken to the OR on 04/29/2024 for a left above knee amputation. Intraoperative findings include extensive soft tissue and skin wounds and necrosis of the LLE. He did well postoperatively. His diet was slowly advanced and at the time of discharge was tolerating a regular diet.   Patient initially presented for worsening LLE wounds and odor on the lower leg and foot that were infested with maggots. He was started on broad spectrum antibiotics, endocrine was consulted for T1DM management, and nephrology for in-patient dialysis.  His lower extremity XR showed no acute pathology of his femur, a spiral fracture of the distal third of the tibial and fibular diaphyses with displacement, and no acute pathology of the foot.   Her hospital course was complicated by poor glycemic control which was managed by endocrinology. He required upgrade to ICU status for Endotool twice during admission, with the most recent time being 11/6. Patient remained on Endotool until his sugars were more controlled. He was stepped down from ICU on the evening of 11/7. He was transitioned to his home insulin  pump and his sugars were well controlled. His hospital course was additionally complicated by diarrhea treated with imodium.  He consistently worked with physical therapy and occupational therapy during his hospitalization. At  the time of discharge, he was cleared by physical therapy to discharge  home. He was identified to have occupational therapy needs, so Home Health services were arranged.  The patient was able to void spontaneously at the time of discharge. Pain was controlled with P.O. pain medication and augmented with dilaudid  PCA during admission. Pain was controlled with P.O. pain medication on discharge. The patient returned to prior activity level with return of bowel function. He is being discharged on 04/28/24 to home in stable condition with planned outpatient follow-up.  Condition at Discharge: Improved Discharge Medications:    Medication List    START taking these medications   . acetaminophen  500 MG tablet; Commonly known as: TYLENOL ; Take 2 tablets  (1,000 mg total) by mouth every six (6) hours as needed for pain.   CHANGE how you take these medications   . oxyCODONE  5 MG immediate release tablet; Commonly known as: ROXICODONE ;  Take 1 tablet (5 mg total) by mouth every four (4) hours as needed for up  to 5 days.; What changed: reasons to take this   CONTINUE taking these medications   . alcohol swabs Padm; Apply 1 each topically Four (4) times a day. . aspirin  81 MG tablet; Commonly known as: ECOTRIN; TAKE ONE TABLET BY  MOUTH ONCE DAILY . atorvastatin  80 MG tablet; Commonly known as: LIPITOR ; Take 1 tablet (80  mg total) by mouth daily. . * blood-glucose,receiver,cont Misc; Commonly known as: DEXCOM G6  RECEIVER; Dispense 1 Dexcom G6 receiver . * DEXCOM G7 RECEIVER Misc; Generic drug: blood-glucose,receiver,cont;  Use as instructed with G7 sensors . cholecalciferol (vitamin D3-50 mcg (2,000 unit)) 50 mcg (2,000 unit)  tablet . cinacalcet  90 MG tablet; Commonly known as: SENSIPAR ; Take 1 tablet (90  mg total) by mouth daily. . clopidogrel  75 mg tablet; Commonly known as: PLAVIX ; Take 1 tablet (75  mg total) by mouth daily. . DEXCOM G6 SENSOR Devi; Generic drug: blood-glucose sensor; Dispense 1  Dexcom G6 sensor every 10 days . DEXCOM G6 TRANSMITTER  Devi; Generic drug: blood-glucose transmitter;  Dispense 1 Dexcom G6 transmitter every 90 days . ezetimibe  10 mg tablet; Commonly known as: ZETIA ; Take 1 tablet (10 mg  total) by mouth daily. . ferric citrate  210 mg iron  Tab tablet; Commonly known as: AURYXIA ; Take  two tablets by mouth with meals, three times a day. Swallow whole.  Do not  chew or crush medication. . gabapentin  300 MG capsule; Commonly known as: NEURONTIN ; Take 2 capsules  (600 mg total) by mouth nightly. SABRA glucagon spray 3 mg/actuation Spry; Use 1 spray in 1 nostril for severe  hypoglycemia, as per package instructions . * insulin  lispro 100 unit/mL injection; Commonly known as: HumaLOG;  Vials to be used in the pump to allow up to 50 units per day. . * HumaLOG KwikPen Insulin  100 unit/mL injection pen; Generic drug:  insulin  lispro; Inject 10 Units under the skin Three (3) times a day  before meals. . insulin  syringe-needle U-100 0.5 mL 31 gauge x 5/16 (8 mm) Syrg; Use  with insulin  Vials, 4 times per day before each meal or as needed to  correct sugar. . ipratropium 42 mcg (0.06 %) nasal spray; Commonly known as: ATROVENT  . isosorbide  mononitrate 30 MG 24 hr tablet; Commonly known as: IMDUR ;  Take 1 tablet (30 mg total) by mouth daily. . * lancets Misc; use to test blood sugar Four (4) times a day with a meal  and nightly. . * lancets Misc;  Use to check blood sugar, up to 5 times a day . lidocaine  2 % Soln . methocarbamol  500 MG tablet; Commonly known as: ROBAXIN ; Take 2 tablets  (1,000 mg total) by mouth two (2) times a day. . metoPROLOL  succinate 100 MG 24 hr tablet; Commonly known as: TOPROL  XL;  Take 1 tablet (100 mg total) by mouth daily. . mirtazapine  30 MG tablet; Commonly known as: REMERON ; Take 1 tablet (30  mg total) by mouth nightly. . naloxone 4 mg/actuation nasal spray; Commonly known as: NARCAN; 1 spray  (4 mg total) into alternating nostrils once as needed for up to 1 dose.  One spray in either  nostril once for known/suspected opioid overdose. May  repeat every 2-3 minutes in alternating nostril til EMS arrives . OMNIPOD 5 G6-G7 INTRO KT(GEN5) Crtg; Generic drug: insulin  pmp  cart,aut,G6/7,cntr; INTRO KIT CONTAIN CONTROLLER AND PODS, TO ALLOW POD  CHANGE EVERY 2 DAYS . OMNIPOD 5 G6-G7 PODS (GEN 5) Crtg; Generic drug: insulin  pump  cart,auto,BT,G6/7; Change pod every 2 days. Pod must be compatible with  Dexcom G 6 and G 7. . OMNIPOD CLASSIC PODS (GEN 3) Crtg; Generic drug: insulin  pump cart,cont  inf,RF; Use 1 Pod every 3 days . ondansetron  4 MG disintegrating tablet; Commonly known as: ZOFRAN -ODT . pantoprazole  40 MG tablet; Commonly known as: Protonix ; Take 1 tablet  (40 mg total) by mouth daily before breakfast. . pen needle, diabetic 32 gauge x 5/32 (4 mm) Ndle; Commonly known as:  UNIFINE PENTIPS; Use to give insulin  5 times a day . sertraline  100 MG tablet; Commonly known as: ZOLOFT ; Take 1.5 tablets  (150 mg total) by mouth daily. . sucralfate 1 gram tablet; Commonly known as: CARAFATE; Take 1 tablet (1  g total) by mouth four (4) times a day as needed (reflux). . tizanidine 2 MG tablet; Commonly known as: ZANAFLEX; TAKE 2 TABLETS BY  MOUTH TWICE DAILY AS NEEDED FOR MUSCLE SPASM . triamcinolone 0.1 % ointment; Commonly known as: KENALOG; Apply  topically two (2) times a day. Put on the scabby areas on the legs . VELTASSA  8.4 gram powder packet; Generic drug: patiromer  calcium   sorbitex; Take 1 packet daily on non-dialysis days only * This list has 6 medication(s) that are the same as other medications  prescribed for you. Read the directions carefully, and ask your doctor or  other care provider to review them with you.   STOP taking these medications   . predniSONE 50 MG tablet; Commonly known as: DELTASONE    Pending Test Results:   Discharge Instructions: Activity:   Diet:  Other Instructions: Other Instructions     Discharge instructions      Postoperative Instructions for Leg Amputations   Having your leg amputated can be traumatic, and learning to live with new limits can be frustrating. Many people feel depressed and may grieve for their former lifestyle. Talking with your family, friends, and health professionals is an important part of your recovery. Remember that even though you've lost a limb, it doesn't change who you are or prevent you from enjoying life. You'll learn new ways to do things, and you can still love, play, and live life to its fullest. You may also find that it helps to talk with a person who has had an amputation. For example, you can go to www.amputee-coalition.org for information and support.       How can you care for yourself at home?   What to do if you have concerns  about healing:   You will have an appointment scheduled in 4 weeks following surgery for your surgeon to check your incisions and determine if your staples/sutures are ready to be removed.  If you have questions or think that something with your amputation is not right, call the Vascular Surgery clinic [facility]- [phone] during regular business hours (8 am-4pm Monday through Friday) and ask to speak with a nurse. If it is after hours or on the weekend, and you have an urgent question, please call the hospital operator [phone] and ask to speak with the Vascular Surgeon on call.  If you have signs/symptoms of infection (fever, chills, night sweats, large amounts of drainage, severe pain) go to your closest Emergency Room.    Cleanse: Please take all bandages, shrinkers and limb guards off at least once a day.   Clean the amputation site daily with mild soap and water. Do not take a tub bath or soak, but showers are okay. Keep your amputation covered with dry gauze if it has any open and or draining areas.    Replace dressings daily with gauze, kerlix and ACE wraps. If wound is not draining, it is okay to place sock or stump shrinker in  place of dressing.   Limb Care: Check your limb daily for irritation, skin breaks, and redness. Tell your doctor about any problems you see. Some swelling in the limb is normal.  At first you will be wrapping your limb with kerlix gauze and ace wrap to manage swelling.  Once you can tolerate the compression then you will wear your elastic stump shrinker (provided in hospital) most of the day. This will help to ensure that your incision can heal and that your limb can develop a nice shape for fitting into a prosthesis. Wear your limb guard (hard black plastic leg covering) to protect your limb when you are up and moving around. It can protect you if you have a fall.  It is also meant to prevent contractures.   You will have Home Health come to your house a few times per week to perform wound checks and dressing changes. Additionally, Occupational Therapy will work with you a few times per week.   Contractures: A contracture is when your hip or knee becomes stuck in one position. If this happens, it may be hard or impossible to straighten your leg and use a prosthetic leg. Being able to bend your hip and/or knee will be an important part of your rehabilitation.    To prevent contractures: Make sure you put equal weight on both hips when you sit. Use firm chairs, and sit up straight. Keep your remaining limb flat with your knees straight and your legs together while you are lying on your back. Lie on your stomach as much as possible to stretch your hip joint. Do not sit for more than an hour or two. Stand, or lie on your stomach now and then. Do not put pillows under your hips or knees or between your thighs. You should wear your limb guard 2 hours on and 2 hours off throughout the day to help prevent knee contracture. Also, wear it whenever you are out of bed.      Phantom Pain:  You may have pain where your leg was. This is called phantom pain. It is common and may come and go for a year or  longer. Most patients get prescribed nerve pain medications in the hospital to help with phantom pain.  The  rehabilitation doctors can continue to help with this if needed.   Activity: Be active. If you are active and use your leg, you will recover faster. You should have seen a physical medicine and rehab physician in the hospital. They are the experts in taking care of patients with amputations and helping increase your function and walking as you recover. You should have seen an orthotist and prosthetist in the hospital and should have a follow up appointment to help get you started on building your prosthetic. Building your prosthetic leg can start as soon as 8 -12 weeks after surgery, but it may take longer. If you do not have these appointments, please call the vascular surgery clinic at [phone].      Labs and Other Follow-ups after Discharge: Follow Up instructions and Outpatient Referrals    Ambulatory Referral to Home Health     Reason for referral: Wound dressing changes and OT services   Physician to follow patient's care: PCP   Disciplines requested:  Occupational Therapy Nursing     Nursing requested: Wound Care   Wound count: Wound 1   Wound 1 type/staging: Surgical wound   Wound 1 location: left AKA stump   Wound 1 care orders: Change dressing daily with gauze, ABD, kerlix and  ACE   Wound 1 order frequency: daily   Occupational Therapy Requested: Evaluate and treat   Requested follow up plan: I would resume responsibility.   Discharge instructions       Future Appointments: Appointments which have been scheduled for you    May 21, 2024 2:50 PM (Arrive by 2:35 PM) RETURN DIABETES with Georgia Jury, MD [facility] DIABETES AND ENDOCRINOLOGY [place] [facility] REGION) [address] [facility] [address] [place] [address] [phone]     Jun 05, 2024 9:45 AM (Arrive by 9:30 AM) POST OP with Lawayne Patience, MD [facility] HEART VASCULAR  CTR SURGERY [place] [facility] REGION) [address] [place] [address] [phone]     Jun 17, 2024 9:00 AM (Arrive by 8:45 AM) DIABETES EDUCATION with Rosario Na [facility] DIABETES AND ENDOCRINOLOGY [place] [facility] REGION) [address] [facility] 1 [address] [place] [address] [phone]

## 2024-05-08 NOTE — Care Plan (Signed)
 Shift Summary Loperamide was administered for loose stools during the shift.  Pain was reported at a moderate level, with a request for oxycodone  but no administration as patient was sleeping; acetaminophen  was refused.  Blood glucose was elevated at 196 mg/dL.  Mean arterial pressure decreased over the course of the shift.  The patient remained free from falls and participated in comfort and safety interventions.   Optimal Comfort and Wellbeing: Comfort interventions such as nonirritating oral fluids and pressure reduction techniques were maintained, and the patient was observed sleeping and awake at intervals; bowel movement occurred during the shift.   Absence of Fall and Fall-Related Injury: Fall reduction program and safety interventions were consistently maintained throughout the shift, including use of low bed, side rails, and hourly visual checks, with no falls documented.   Optimal Pain Control and Function: Pain was reported at a moderate level (6/10) during the shift, with a request for oxycodone  at 10:30 PM but no administration as the patient was sleeping; acetaminophen  was refused.   Blood Glucose Levels Within Targeted Range: Point-of-care glucose was elevated at 196 mg/dL during the shift.   Blood Pressure in Desired Range: Mean arterial pressure trended downward from 85 to 74 mmHg over the shift.

## 2024-05-12 ENCOUNTER — Observation Stay
Admission: EM | Admit: 2024-05-12 | Discharge: 2024-05-13 | Disposition: A | Discharge status: Home / Self Care | Attending: Family Medicine | Admitting: Family Medicine

## 2024-05-12 ENCOUNTER — Other Ambulatory Visit: Payer: Self-pay

## 2024-05-12 ENCOUNTER — Emergency Department

## 2024-05-12 DIAGNOSIS — E1110 Type 2 diabetes mellitus with ketoacidosis without coma: Secondary | ICD-10-CM | POA: Diagnosis present

## 2024-05-12 DIAGNOSIS — N186 End stage renal disease: Secondary | ICD-10-CM | POA: Insufficient documentation

## 2024-05-12 DIAGNOSIS — D631 Anemia in chronic kidney disease: Secondary | ICD-10-CM | POA: Insufficient documentation

## 2024-05-12 DIAGNOSIS — E1010 Type 1 diabetes mellitus with ketoacidosis without coma: Principal | ICD-10-CM

## 2024-05-12 DIAGNOSIS — Z89612 Acquired absence of left leg above knee: Secondary | ICD-10-CM | POA: Insufficient documentation

## 2024-05-12 DIAGNOSIS — I739 Peripheral vascular disease, unspecified: Secondary | ICD-10-CM | POA: Diagnosis not present

## 2024-05-12 DIAGNOSIS — Z992 Dependence on renal dialysis: Secondary | ICD-10-CM | POA: Insufficient documentation

## 2024-05-12 DIAGNOSIS — I120 Hypertensive chronic kidney disease with stage 5 chronic kidney disease or end stage renal disease: Secondary | ICD-10-CM | POA: Insufficient documentation

## 2024-05-12 DIAGNOSIS — I2510 Atherosclerotic heart disease of native coronary artery without angina pectoris: Secondary | ICD-10-CM | POA: Diagnosis not present

## 2024-05-12 DIAGNOSIS — F1290 Cannabis use, unspecified, uncomplicated: Secondary | ICD-10-CM | POA: Diagnosis not present

## 2024-05-12 DIAGNOSIS — F1090 Alcohol use, unspecified, uncomplicated: Secondary | ICD-10-CM | POA: Diagnosis not present

## 2024-05-12 DIAGNOSIS — E875 Hyperkalemia: Principal | ICD-10-CM | POA: Insufficient documentation

## 2024-05-12 DIAGNOSIS — Z7901 Long term (current) use of anticoagulants: Secondary | ICD-10-CM | POA: Diagnosis not present

## 2024-05-12 DIAGNOSIS — Z79899 Other long term (current) drug therapy: Secondary | ICD-10-CM | POA: Diagnosis not present

## 2024-05-12 DIAGNOSIS — E1040 Type 1 diabetes mellitus with diabetic neuropathy, unspecified: Secondary | ICD-10-CM | POA: Insufficient documentation

## 2024-05-12 DIAGNOSIS — E1022 Type 1 diabetes mellitus with diabetic chronic kidney disease: Secondary | ICD-10-CM | POA: Diagnosis not present

## 2024-05-12 DIAGNOSIS — Z7982 Long term (current) use of aspirin: Secondary | ICD-10-CM | POA: Insufficient documentation

## 2024-05-12 DIAGNOSIS — R5381 Other malaise: Secondary | ICD-10-CM | POA: Diagnosis present

## 2024-05-12 LAB — GLUCOSE, CAPILLARY
Glucose-Capillary: 584 mg/dL (ref 70–99)
Glucose-Capillary: 455 mg/dL — ABNORMAL HIGH (ref 70–99)
Glucose-Capillary: 448 mg/dL — ABNORMAL HIGH (ref 70–99)
Glucose-Capillary: 534 mg/dL (ref 70–99)
Glucose-Capillary: 411 mg/dL — ABNORMAL HIGH (ref 70–99)
Glucose-Capillary: 433 mg/dL — ABNORMAL HIGH (ref 70–99)
Glucose-Capillary: 390 mg/dL — ABNORMAL HIGH (ref 70–99)
Glucose-Capillary: 268 mg/dL — ABNORMAL HIGH (ref 70–99)
Glucose-Capillary: 153 mg/dL — ABNORMAL HIGH (ref 70–99)
Glucose-Capillary: 154 mg/dL — ABNORMAL HIGH (ref 70–99)
Glucose-Capillary: 118 mg/dL — ABNORMAL HIGH (ref 70–99)
Glucose-Capillary: 104 mg/dL — ABNORMAL HIGH (ref 70–99)
Glucose-Capillary: 132 mg/dL — ABNORMAL HIGH (ref 70–99)

## 2024-05-12 LAB — BASIC METABOLIC PANEL WITH GFR
Anion gap: 19 — ABNORMAL HIGH (ref 5–15)
Anion gap: 18 — ABNORMAL HIGH (ref 5–15)
Anion gap: 19 — ABNORMAL HIGH (ref 5–15)
BUN: 71 mg/dL — ABNORMAL HIGH (ref 6–20)
BUN: 69 mg/dL — ABNORMAL HIGH (ref 6–20)
BUN: 68 mg/dL — ABNORMAL HIGH (ref 6–20)
CO2: 21 mmol/L — ABNORMAL LOW (ref 22–32)
CO2: 22 mmol/L (ref 22–32)
CO2: 22 mmol/L (ref 22–32)
Calcium: 8.2 mg/dL — ABNORMAL LOW (ref 8.9–10.3)
Calcium: 8.5 mg/dL — ABNORMAL LOW (ref 8.9–10.3)
Calcium: 8.6 mg/dL — ABNORMAL LOW (ref 8.9–10.3)
Chloride: 91 mmol/L — ABNORMAL LOW (ref 98–111)
Chloride: 92 mmol/L — ABNORMAL LOW (ref 98–111)
Chloride: 95 mmol/L — ABNORMAL LOW (ref 98–111)
Creatinine, Ser: 9.07 mg/dL — ABNORMAL HIGH (ref 0.61–1.24)
Creatinine, Ser: 9.59 mg/dL — ABNORMAL HIGH (ref 0.61–1.24)
Creatinine, Ser: 9.52 mg/dL — ABNORMAL HIGH (ref 0.61–1.24)
GFR, Estimated: 7 mL/min — ABNORMAL LOW (ref >60)
GFR, Estimated: 6 mL/min — ABNORMAL LOW (ref >60)
GFR, Estimated: 7 mL/min — ABNORMAL LOW (ref >60)
Glucose, Bld: 742 mg/dL (ref 70–99)
Glucose, Bld: 640 mg/dL (ref 70–99)
Glucose, Bld: 303 mg/dL — ABNORMAL HIGH (ref 70–99)
Potassium: 6.7 mmol/L (ref 3.5–5.1)
Potassium: 6.2 mmol/L — ABNORMAL HIGH (ref 3.5–5.1)
Potassium: 5.5 mmol/L — ABNORMAL HIGH (ref 3.5–5.1)
Sodium: 131 mmol/L — ABNORMAL LOW (ref 135–145)
Sodium: 131 mmol/L — ABNORMAL LOW (ref 135–145)
Sodium: 136 mmol/L (ref 135–145)

## 2024-05-12 LAB — BLOOD GAS, VENOUS
Acid-base deficit: 2.2 mmol/L — ABNORMAL HIGH (ref 0.0–2.0)
Bicarbonate: 24.2 mmol/L (ref 20.0–28.0)
O2 Saturation: 89 %
Patient temperature: 37
pCO2, Ven: 47 mmHg (ref 44–60)
pH, Ven: 7.32 (ref 7.25–7.43)
pO2, Ven: 59 mmHg — ABNORMAL HIGH (ref 32–45)

## 2024-05-12 LAB — CBC
HCT: 28.8 % — ABNORMAL LOW (ref 39.0–52.0)
Hemoglobin: 8.4 g/dL — ABNORMAL LOW (ref 13.0–17.0)
MCH: 26.3 pg (ref 26.0–34.0)
MCHC: 29.2 g/dL — ABNORMAL LOW (ref 30.0–36.0)
MCV: 90.3 fL (ref 80.0–100.0)
Platelets: 367 K/uL (ref 150–400)
RBC: 3.19 MIL/uL — ABNORMAL LOW (ref 4.22–5.81)
RDW: 20.2 % — ABNORMAL HIGH (ref 11.5–15.5)
WBC: 8 K/uL (ref 4.0–10.5)
nRBC: 0 % (ref 0.0–0.2)

## 2024-05-12 LAB — HEPATIC FUNCTION PANEL
ALT: 30 U/L (ref 0–44)
AST: 39 U/L (ref 15–41)
Albumin: 3.3 g/dL — ABNORMAL LOW (ref 3.5–5.0)
Alkaline Phosphatase: 79 U/L (ref 38–126)
Bilirubin, Direct: <0.1 mg/dL (ref 0.0–0.2)
Total Bilirubin: 0.2 mg/dL (ref 0.0–1.2)
Total Protein: 6.7 g/dL (ref 6.5–8.1)

## 2024-05-12 LAB — CBG MONITORING, ED
Glucose-Capillary: >600 mg/dL (ref 70–99)
Glucose-Capillary: >600 mg/dL (ref 70–99)
Glucose-Capillary: >600 mg/dL (ref 70–99)
Glucose-Capillary: 598 mg/dL (ref 70–99)

## 2024-05-12 LAB — BETA-HYDROXYBUTYRIC ACID
Beta-Hydroxybutyric Acid: 0.12 mmol/L (ref 0.05–0.27)
Beta-Hydroxybutyric Acid: 0.08 mmol/L (ref 0.05–0.27)

## 2024-05-12 LAB — MRSA NEXT GEN BY PCR, NASAL: MRSA by PCR Next Gen: NOT DETECTED

## 2024-05-12 LAB — HEPATITIS B SURFACE ANTIGEN: Hepatitis B Surface Ag: NONREACTIVE

## 2024-05-12 MED ORDER — PANTOPRAZOLE SODIUM 40 MG PO TBEC
40.0000 mg | DELAYED_RELEASE_TABLET | Freq: Every day | ORAL | Status: DC
  Administered 2024-05-12 – 2024-05-13 (×2): 40 mg via ORAL
  Filled 2024-05-12 (×2): qty 1

## 2024-05-12 MED ORDER — INSULIN ASPART 100 UNIT/ML IJ SOLN: 0.0000 [IU] | Freq: Three times a day (TID) | INTRAMUSCULAR | Status: DC

## 2024-05-12 MED ORDER — ATORVASTATIN CALCIUM 80 MG PO TABS
80.0000 mg | ORAL_TABLET | Freq: Every evening | ORAL | Status: DC
  Administered 2024-05-12: 80 mg via ORAL
  Filled 2024-05-12: qty 1

## 2024-05-12 MED ORDER — SODIUM CHLORIDE 0.9 % IV BOLUS
500.0000 mL | Freq: Once | INTRAVENOUS | Status: AC
  Administered 2024-05-12: 500 mL via INTRAVENOUS

## 2024-05-12 MED ORDER — INSULIN ASPART 100 UNIT/ML IJ SOLN
3.0000 [IU] | Freq: Three times a day (TID) | INTRAMUSCULAR | Status: DC
  Administered 2024-05-13 (×2): 3 [IU] via SUBCUTANEOUS
  Filled 2024-05-12 (×2): qty 3

## 2024-05-12 MED ORDER — DEXTROSE 50 % IV SOLN: 0.0000 mL | INTRAVENOUS | Status: DC | PRN

## 2024-05-12 MED ORDER — METHOCARBAMOL 500 MG PO TABS: 500.0000 mg | ORAL_TABLET | Freq: Three times a day (TID) | ORAL | Status: DC | PRN

## 2024-05-12 MED ORDER — DOCUSATE SODIUM 100 MG PO CAPS
100.0000 mg | ORAL_CAPSULE | Freq: Two times a day (BID) | ORAL | Status: DC
  Administered 2024-05-12: 100 mg via ORAL
  Filled 2024-05-12 (×2): qty 1

## 2024-05-12 MED ORDER — DEXTROSE IN LACTATED RINGERS 5 % IV SOLN: INTRAVENOUS | Status: DC

## 2024-05-12 MED ORDER — SUCROFERRIC OXYHYDROXIDE 500 MG PO CHEW: 500.0000 mg | CHEWABLE_TABLET | Freq: Three times a day (TID) | ORAL | Status: DC

## 2024-05-12 MED ORDER — INSULIN ASPART 100 UNIT/ML IV SOLN: 10.0000 [IU] | Freq: Once | INTRAVENOUS | Status: DC

## 2024-05-12 MED ORDER — HEPARIN SODIUM (PORCINE) 5000 UNIT/ML IJ SOLN
5000.0000 [IU] | Freq: Three times a day (TID) | INTRAMUSCULAR | Status: DC
  Administered 2024-05-12 – 2024-05-13 (×3): 5000 [IU] via SUBCUTANEOUS
  Filled 2024-05-12 (×3): qty 1

## 2024-05-12 MED ORDER — INSULIN GLARGINE-YFGN 100 UNIT/ML ~~LOC~~ SOLN
8.0000 [IU] | Freq: Every day | SUBCUTANEOUS | Status: DC
  Administered 2024-05-12 – 2024-05-13 (×2): 8 [IU] via SUBCUTANEOUS
  Filled 2024-05-12 (×2): qty 0.08

## 2024-05-12 MED ORDER — PATIROMER SORBITEX CALCIUM 8.4 G PO PACK
2.0000 | PACK | Freq: Every day | ORAL | Status: DC
  Administered 2024-05-12: 2 via ORAL
  Filled 2024-05-12 (×2): qty 2

## 2024-05-12 MED ORDER — CALCIUM GLUCONATE 10 % IV SOLN
1.0000 g | Freq: Once | INTRAVENOUS | Status: AC
  Administered 2024-05-12: 1 g via INTRAVENOUS
  Filled 2024-05-12: qty 10

## 2024-05-12 MED ORDER — ISOSORBIDE MONONITRATE ER 30 MG PO TB24
30.0000 mg | ORAL_TABLET | Freq: Every day | ORAL | Status: DC
  Administered 2024-05-13: 30 mg via ORAL
  Filled 2024-05-12: qty 1

## 2024-05-12 MED ORDER — LACTATED RINGERS IV BOLUS: 500.0000 mL | Freq: Once | INTRAVENOUS | Status: DC

## 2024-05-12 MED ORDER — IPRATROPIUM BROMIDE 0.06 % NA SOLN
2.0000 | Freq: Four times a day (QID) | NASAL | Status: DC
  Administered 2024-05-12 – 2024-05-13 (×4): 2 via NASAL
  Filled 2024-05-12: qty 15

## 2024-05-12 MED ORDER — CINACALCET HCL 30 MG PO TABS
90.0000 mg | ORAL_TABLET | Freq: Every morning | ORAL | Status: DC
  Administered 2024-05-12 – 2024-05-13 (×2): 90 mg via ORAL
  Filled 2024-05-12 (×2): qty 3

## 2024-05-12 MED ORDER — ORAL CARE MOUTH RINSE: 15.0000 mL | OROMUCOSAL | Status: DC | PRN

## 2024-05-12 MED ORDER — CHLORHEXIDINE GLUCONATE CLOTH 2 % EX PADS
6.0000 | MEDICATED_PAD | Freq: Every day | CUTANEOUS | Status: DC
  Administered 2024-05-12 – 2024-05-13 (×2): 6 via TOPICAL
  Filled 2024-05-12: qty 6

## 2024-05-12 MED ORDER — SERTRALINE HCL 50 MG PO TABS
100.0000 mg | ORAL_TABLET | Freq: Every day | ORAL | Status: DC
  Administered 2024-05-12 – 2024-05-13 (×2): 100 mg via ORAL
  Filled 2024-05-12 (×2): qty 2

## 2024-05-12 MED ORDER — ALBUTEROL SULFATE (2.5 MG/3ML) 0.083% IN NEBU
10.0000 mg | INHALATION_SOLUTION | Freq: Once | RESPIRATORY_TRACT | Status: AC
  Administered 2024-05-12: 10 mg via RESPIRATORY_TRACT
  Filled 2024-05-12: qty 12

## 2024-05-12 MED ORDER — ISOSORBIDE MONONITRATE ER 30 MG PO TB24: 30.0000 mg | ORAL_TABLET | Freq: Every day | ORAL | Status: DC

## 2024-05-12 MED ORDER — INSULIN ASPART 100 UNIT/ML IJ SOLN
0.0000 [IU] | Freq: Three times a day (TID) | INTRAMUSCULAR | Status: DC
  Administered 2024-05-13 (×2): 1 [IU] via SUBCUTANEOUS
  Filled 2024-05-12 (×2): qty 1

## 2024-05-12 MED ORDER — MIRTAZAPINE 15 MG PO TABS
30.0000 mg | ORAL_TABLET | Freq: Every day | ORAL | Status: DC
  Administered 2024-05-12: 30 mg via ORAL
  Filled 2024-05-12: qty 2

## 2024-05-12 MED ORDER — OXYCODONE-ACETAMINOPHEN 5-325 MG PO TABS: 2.0000 | ORAL_TABLET | Freq: Three times a day (TID) | ORAL | Status: DC | PRN

## 2024-05-12 MED ORDER — LACTATED RINGERS IV SOLN: INTRAVENOUS | Status: DC

## 2024-05-12 MED ORDER — GABAPENTIN 300 MG PO CAPS
600.0000 mg | ORAL_CAPSULE | Freq: Every day | ORAL | Status: DC
  Administered 2024-05-12: 600 mg via ORAL
  Filled 2024-05-12: qty 2

## 2024-05-12 MED ORDER — LACTATED RINGERS IV BOLUS
20.0000 mL/kg | Freq: Once | INTRAVENOUS | Status: AC
Start: 2024-05-12 — End: 2024-05-12
  Administered 2024-05-12: 1360 mL via INTRAVENOUS

## 2024-05-12 MED ORDER — METOPROLOL TARTRATE 50 MG PO TABS
50.0000 mg | ORAL_TABLET | Freq: Two times a day (BID) | ORAL | Status: DC
  Administered 2024-05-12 – 2024-05-13 (×3): 50 mg via ORAL
  Filled 2024-05-12 (×3): qty 1

## 2024-05-12 MED ORDER — ASPIRIN 81 MG PO CHEW
81.0000 mg | CHEWABLE_TABLET | Freq: Every day | ORAL | Status: DC
  Administered 2024-05-12 – 2024-05-13 (×2): 81 mg via ORAL
  Filled 2024-05-12 (×2): qty 1

## 2024-05-12 MED ORDER — FERRIC CITRATE 1 GM 210 MG(FE) PO TABS
420.0000 mg | ORAL_TABLET | Freq: Three times a day (TID) | ORAL | Status: DC
  Administered 2024-05-13 (×2): 420 mg via ORAL
  Filled 2024-05-12 (×4): qty 2

## 2024-05-12 MED ORDER — SODIUM ZIRCONIUM CYCLOSILICATE 10 G PO PACK
10.0000 g | PACK | Freq: Once | ORAL | Status: AC
  Administered 2024-05-12: 10 g via ORAL
  Filled 2024-05-12: qty 1

## 2024-05-12 MED ORDER — METOPROLOL SUCCINATE ER 50 MG PO TB24: 100.0000 mg | ORAL_TABLET | Freq: Every day | ORAL | Status: DC

## 2024-05-12 MED ORDER — EZETIMIBE 10 MG PO TABS
10.0000 mg | ORAL_TABLET | Freq: Every day | ORAL | Status: DC
  Administered 2024-05-12 – 2024-05-13 (×2): 10 mg via ORAL
  Filled 2024-05-12 (×2): qty 1

## 2024-05-12 MED ORDER — CLOPIDOGREL BISULFATE 75 MG PO TABS
75.0000 mg | ORAL_TABLET | Freq: Every day | ORAL | Status: DC
  Administered 2024-05-12 – 2024-05-13 (×2): 75 mg via ORAL
  Filled 2024-05-12 (×2): qty 1

## 2024-05-12 MED ORDER — LIDOCAINE VISCOUS HCL 2 % MT SOLN: 15.0000 mL | OROMUCOSAL | Status: DC | PRN

## 2024-05-12 MED ORDER — INSULIN REGULAR(HUMAN) IN NACL 100-0.9 UT/100ML-% IV SOLN
INTRAVENOUS | Status: DC
  Administered 2024-05-12: 6 [IU]/h via INTRAVENOUS
  Filled 2024-05-12: qty 100

## 2024-05-12 NOTE — ED Provider Notes (Signed)
 [facility] Provider Note    Event Date/Time   First MD Initiated Contact with Patient 05/12/24 [phone]     (approximate)   History   Hyperglycemia   HPI  Raymond Lamb is a 38 year old male with history of T1DM, ESRD on HD, HTN, PAD s/p recent left AKA presenting to the emergency department for evaluation of elevated blood sugar.  Patient reports that yesterday morning he changed his insulin  pump.  Sometime yesterday he began to feel poorly.  Today he noticed that his blood sugar was running high with his continuous glucose monitor even though he had his insulin  pump in place.  Reports that this is changed every 3 days typically.  Denies any fevers chills, chest pain, shortness of breath, vomiting.  Reports his mother has been doing dressing changes over his leg but also has home health coming out.  Reviewed discharge summary from [facility] system from 05/08/2024.  At that time patient presented with extensive soft tissue of the left lower extremity wound.  Had issues with glycemic control requiring Endo tool while in the hospital.  Underwent left AKA on 11/4 with good wound healing postoperatively.     Physical Exam   Triage Vital Signs: ED Triage Vitals  Encounter Vitals Group     BP 05/12/24 0749 117/79     Girls Systolic BP Percentile --      Girls Diastolic BP Percentile --      Boys Systolic BP Percentile --      Boys Diastolic BP Percentile --      Pulse Rate 05/12/24 0749 80     Resp 05/12/24 0749 14     Temp 05/12/24 0749 97.8 F (36.6 C)     Temp Source 05/12/24 0749 Oral     SpO2 05/12/24 0749 95 %     Weight 05/12/24 0746 150 lb (68 kg)     Height 05/12/24 0746 5' 9 (1.753 m)     Head Circumference --      Peak Flow --      Pain Score 05/12/24 0743 0     Pain Loc --      Pain Education --      Exclude from Growth Chart --     Most recent vital signs: Vitals:   05/12/24 0749 05/12/24 0900  BP: 117/79 96/60  Pulse: 80 76  Resp: 14    Temp: 97.8 F (36.6 C)   SpO2: 95% 94%     General: Awake, interactive  CV:  Good peripheral perfusion Resp:  Unlabored respirations, lungs clear to auscultation Abd:  Nondistended.  Neuro:  Symmetric facial movement, fluid speech Other:   Clean dressing in place over the left lower extremity.  When this is taken down a well-healing incision is noted with staples in place over the stump.  No erythema, warmth, drainage.  There is a insulin  pump with a Dexcom adjacent over the right thigh.  No surrounding erythema, drainage. LUE fistula with palpable thrill.    ED Results / Procedures / Treatments   Labs (all labs ordered are listed, but only abnormal results are displayed) Labs Reviewed  BASIC METABOLIC PANEL WITH GFR - Abnormal; Notable for the following components:      Result Value   Sodium 131 (*)    Potassium 6.7 (*)    Chloride 91 (*)    CO2 21 (*)    Glucose, Bld 742 (*)    BUN 71 (*)    Creatinine, Ser  9.07 (*)    Calcium  8.2 (*)    GFR, Estimated 7 (*)    Anion gap 19 (*)    All other components within normal limits  CBC - Abnormal; Notable for the following components:   RBC 3.19 (*)    Hemoglobin 8.4 (*)    HCT 28.8 (*)    MCHC 29.2 (*)    RDW 20.2 (*)    All other components within normal limits  BLOOD GAS, VENOUS - Abnormal; Notable for the following components:   pO2, Ven 59 (*)    Acid-base deficit 2.2 (*)    All other components within normal limits  CBG MONITORING, ED - Abnormal; Notable for the following components:   Glucose-Capillary >600 (*)    All other components within normal limits  HEPATIC FUNCTION PANEL  BETA-HYDROXYBUTYRIC ACID  CBG MONITORING, ED  CBG MONITORING, ED     EKG EKG independently reviewed and interpreted by myself demonstrates:  EKG demonstrate sinus rhythm at a rate of 80, PR 150, QRS 85, QTc 454, somewhat peaked appearance of T waves, no STEMI  RADIOLOGY Imaging independently reviewed and interpreted by myself  demonstrates:   Formal Radiology Read:  No results found.  PROCEDURES:  Critical Care performed: Yes, see critical care procedure note(s)  CRITICAL CARE Performed by: Nilsa Dade   Total critical care time: 31 minutes  Critical care time was exclusive of separately billable procedures and treating other patients.  Critical care was necessary to treat or prevent imminent or life-threatening deterioration.  Critical care was time spent personally by me on the following activities: development of treatment plan with patient and/or surrogate as well as nursing, discussions with consultants, evaluation of patient's response to treatment, examination of patient, obtaining history from patient or surrogate, ordering and performing treatments and interventions, ordering and review of laboratory studies, ordering and review of radiographic studies, pulse oximetry and re-evaluation of patient's condition.   Procedures   MEDICATIONS ORDERED IN ED: Medications  albuterol  (PROVENTIL ) (2.5 MG/3ML) 0.083% nebulizer solution 10 mg (has no administration in time range)  sodium zirconium cyclosilicate  (LOKELMA ) packet 10 g (has no administration in time range)  insulin  regular, human (MYXREDLIN ) 100 units/ 100 mL infusion (6 Units/hr Intravenous New Bag/Given 05/12/24 0959)  lactated ringers  infusion (has no administration in time range)  dextrose  5 % in lactated ringers  infusion (has no administration in time range)  dextrose  50 % solution 0-50 mL (has no administration in time range)  Chlorhexidine  Gluconate Cloth 2 % PADS 6 each (has no administration in time range)  calcium  gluconate inj 10% (1 g) URGENT USE ONLY! (1 g Intravenous Given 05/12/24 0959)  sodium chloride  0.9 % bolus 500 mL (500 mLs Intravenous New Bag/Given 05/12/24 0952)     IMPRESSION / MDM / ASSESSMENT AND PLAN / ED COURSE  I reviewed the triage vital signs and the nursing notes.  Differential diagnosis includes, but is not  limited to, DKA secondary to insulin  pump malfunction, malfunctioning CGM, no evidence of superinfection or recent surgical site, electrolyte abnormality  Patient's presentation is most consistent with acute presentation with potential threat to life or bodily function.  39 year old male presenting to the emergency department for evaluation of hyperglycemia.  Given timing of events I am concerned about insulin  pump dysfunction.  CBG here does confirm glucose greater than 600.  High concern for DKA.  Labs ordered.  VBG with only mild acidosis with pH of 7.32, bicarb of 24.  CBC with mild stable anemia.  Will await results of BMP.  Clinical Course as of 05/12/24 1002  Mon May 12, 2024  9066 Basic metabolic panel(!!) Potassium critically elevated, does appear that patient has some peaked T waves compared to baseline.  Ordered for temporizing treatment with calcium , insulin , Lokelma , albuterol .  Nephrology consulted.  BMP also notable for significant hyperglycemia with mild anion gap metabolic acidosis with bicarb of 21, anion gap of 19.  VBG with normal pH, but downtrending bicarb, will go ahead and initiate insulin  drip. [NR]  [phone] Case reviewed with Dr. Dennise with nephrology. His team will evaluate for anticipated HD today. Patient has LUE fistula. Will reach out to medicine team to discuss admission.  [NR]  [phone] Case discussed with Dr. Laurita with hospitalist team.  He will evaluate for anticipated admission. [NR]    Clinical Course User Index [NR] Levander Slate, MD     FINAL CLINICAL IMPRESSION(S) / ED DIAGNOSES   Final diagnoses:  Hyperkalemia  Diabetic ketoacidosis without coma associated with type 1 diabetes mellitus (HCC)     Rx / DC Orders   ED Discharge Orders     None        Note:  This document was prepared using Dragon voice recognition software and may include unintentional dictation errors.   Levander Slate, MD 05/12/24 1002

## 2024-05-12 NOTE — Inpatient Diabetes Management (Addendum)
 Inpatient Diabetes Program Recommendations  AACE/ADA: New Consensus Statement on Inpatient Glycemic Control (2015)  Target Ranges:  Prepandial:   less than 140 mg/dL      Peak postprandial:   less than 180 mg/dL (1-2 hours)      Critically ill patients:  140 - 180 mg/dL   Lab Results  Component Value Date   GLUCAP 455 (H) 05/12/2024   HGBA1C 9.4 (H) 02/20/2024    Review of Glycemic Control  Latest Reference Range & Units 05/12/24 07:42 05/12/24 09:58 05/12/24 10:22 05/12/24 10:55 05/12/24 11:24 05/12/24 12:07  Glucose-Capillary 70 - 99 mg/dL >399 (HH) >399 (HH) >399 (HH) 598 (HH) 584 (HH) 455 (H)  Outpatient Diabetes medications: OmniPod 5 (basal 0.35 units/hr (8.4 units per 24 hours); I:CR 1:1 (pt. Put in 1 ot 2 for CHO coverage), I:SF 1:90 mg/dl); off pump plan Lantus  8 units at bedtime, insulin  lispro 3 units TID with meals plus 1:50 mg/dl >859 mg/dl for correction  Current orders for Inpatient glycemic control:  IV insulin    Spoke with patient's mother by phone.  She states that she used to help the patient put his insulin  pump on, however during his last hospitalization he learned to put on Omnipod himself so she was letting him place it.  Explained that RN in ED removed insulin  pump site and found that cannula was bent and not penetrating skin.  She states that she will begin helping him with insulin  pump again.  She has supplies and can bring tomorrow.    -If patient transitions off insulin  drip today (acidosis cleared), Consider off pump plan which is: Lantus  8 units (2 hours prior to d/c of insulin  drip), Novolog  3 units with meals and a very sensitive correction (0-6 units) tid with meals.   Addendum 1350- Briefly spoke to patient and told him about the site failure.  Discussed having his mom help him when he changes sites in the future.  Explained that if CBG's go up while on insulin  pump, site should immediately be changed to ensure that insulin  is infusing. Patient  appreciative of information.   Thanks,  Randall Bullocks, RN, BC-ADM Inpatient Diabetes Coordinator Pager [phone]  (8a-5p)

## 2024-05-12 NOTE — Inpatient Diabetes Management (Addendum)
 Inpatient Diabetes Program Recommendations  AACE/ADA: New Consensus Statement on Inpatient Glycemic Control (2015)  Target Ranges:  Prepandial:   less than 140 mg/dL      Peak postprandial:   less than 180 mg/dL (1-2 hours)      Critically ill patients:  140 - 180 mg/dL   Lab Results  Component Value Date   GLUCAP >600 (HH) 05/12/2024   HGBA1C 9.4 (H) 02/20/2024    Review of Glycemic Control  Latest Reference Range & Units 05/12/24 07:42  Glucose-Capillary 70 - 99 mg/dL >399 (HH)   Diabetes history: DM 1/ESRD Outpatient Diabetes medications: OmniPod 5 (basal 0.35 units/hr (8.4 units per 24 hours); I:CR 1:1 (pt. Put in 1 ot 2 for CHO coverage), I:SF 1:90 mg/dl); off pump plan Lantus  8 units at bedtime, insulin  lispro 3 units TID with meals plus 1:50 mg/dl >859 mg/dl for correction  Current orders for Inpatient glycemic control:  IV insulin   Inpatient Diabetes Program Recommendations:    Note patient to ED with glucose>600 mg/dL.  He uses Omnipod insulin  pump which according to progress notes was changes yesterday.  Unclear what might have precipitated high blood sugar.  Currently ordered IV insulin -  Will see patient today to discuss.    Addendum: 12 noon-  Note documentation that insulin  pump removed by ED RN.  She noted that cannula was bent and was not penetrating skin.  This would explain pump failure.  Notified MD.   Randall Bullocks, RN, BC-ADM Inpatient Diabetes Coordinator Pager [phone]  (8a-5p)

## 2024-05-12 NOTE — H&P (Signed)
 History and Physical    Coy Rochford FMW:[id number] DOB: 20-Jan-1986 DOA: 05/12/2024  [facility] (Confirm with patient/family/NH records and if not entered, this has to be entered at [facility] point of entry) Patient coming from: Home  I have personally briefly reviewed patient's old medical records in [facility]  Chief Complaint: Feeling sick  HPI: Raymond Lamb is a 38 y.o. male with medical history significant of insulin -dependent diabetes on insulin  pump, most CAD status post PCI and stenting, PAD status post recent AKA, ESRD on HD MWF, HTN, HLD, presented with multiple complaints of malaise, lethargy, and nausea.  Patient had a insulin  pump change yesterday morning.  Sometime in the afternoon he started to feel tired and sleepy, he is continuous glucose monitor continue to show  high through the evening.  Last night he started to feel nausea but no vomiting and decided come to ED.  Patient was just recently hospitalized at [facility] with nonhealing left lower extremity wounds and infection eventually underwent AKI on 04/29/2024.  Patient reported good healing of the wound.  Denied any chest pain abdominal pain diarrhea.  His last HD was last Friday, he denied any cough shortness of breath. ED Course: Afebrile, not tachycardia not hypotension and hypoxic, blood work showed glucose 742 bicarb 21K 6.7 BUN 71 creatinine 9.0.  Patient was given hyperkalemia cocktail, including Lokelma , insulin  IV push and started on insulin  drip  Review of Systems: As per HPI otherwise 14 point review of systems negative.    Past Medical History:  Diagnosis Date   Anemia of chronic disease    CAD (coronary artery disease)    a. 11/2015 Cath: LM nl, LAD 30p, LCX nl, OM2 70-80, RCA 50-45m; b. 08/2020 MV: EF 56%. No isch/infarct; c. 10/2020 NSTEMI in setting of DKA-->Cath: LM nl, LAD nl, D2 70, LCX nl, OM2 70, OM3 70 inf branch, RCA 80p/m-->Med rx.   ESRD (end stage renal disease) (HCC)    Essential  hypertension    History of echocardiogram    a. 10/2020 Echo: EF >55%, nl RV fxn.   Mixed hyperlipidemia    PVD (peripheral vascular disease)    a. 07/2015 PTA/Stenting RSFA; b. 12/2015 LLE Fem->PTA bypass; c. 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 04/2020 Duplex: patent LLE bypass. Mild prox RSFA stenosis. Stable ABIs.   Stroke [facility])    Tobacco abuse    Type 1 diabetes [facility])     Past Surgical History:  Procedure Laterality Date   AVF Left    LEG AMPUTATION BELOW KNEE Left    LOWER EXTREMITY ANGIOGRAPHY Left 02/20/2024   Procedure: Lower Extremity Angiography;  Surgeon: Jama Cordella MATSU, MD;  Location: [facility] INVASIVE CV LAB;  Service: Cardiovascular;  Laterality: Left;     reports that he has been smoking cigarettes. He has a 6 pack-year smoking history. He has never used smokeless tobacco. He reports current drug use. Drug: Marijuana. He reports that he does not drink alcohol.  Allergies  Allergen Reactions   Baclofen Itching and Other (See Comments)    Became disoriented, emesis   Camellia Swelling   Iodinated Contrast Media Hives   Lisinopril Swelling   Other Swelling   Oxybenzone     Other reaction(s): Unknown    Family History  Problem Relation Age of Onset   Diabetes type I Other    Clotting disorder Neg Hx      Prior to Admission medications   Medication Sig Start Date End Date Taking? Authorizing Provider  aspirin  81 MG chewable tablet Chew 81 mg by mouth daily. 11/25/20   [provider]  atorvastatin  (LIPITOR ) 80 MG tablet Take 80 mg by mouth every evening. 09/29/20   [provider]  AURYXIA  1 GM 210 MG(Fe) tablet Take 420 mg by mouth 3 (three) times daily with meals. 10/12/22   [provider]  Cholecalciferol 50 MCG (2000 UT) TABS Take 1 tablet by mouth daily. 05/09/23   [provider]  cinacalcet  (SENSIPAR ) 90 MG tablet Take 90 mg by mouth every morning. 05/16/23   [provider]  clopidogrel  (PLAVIX ) 75 MG  tablet Take 75 mg by mouth daily. 08/30/20   [provider]  docusate sodium  (COLACE) 100 MG capsule Take 1 capsule (100 mg total) by mouth 2 (two) times daily. 04/18/24 05/18/24  Ward, Josette SAILOR, DO  ezetimibe  (ZETIA ) 10 MG tablet Take 10 mg by mouth daily. 02/10/21 02/10/22  [provider]  gabapentin  (NEURONTIN ) 300 MG capsule Take 600 mg by mouth at bedtime. 09/28/20   [provider]  insulin  lispro (HUMALOG) 100 UNIT/ML injection Inject 50 Units into the skin daily. Uses in Pump 01/18/24   [provider]  ipratropium (ATROVENT ) 0.06 % nasal spray Place 2 sprays into both nostrils 4 (four) times daily. 03/17/24   Arvis Jolan NOVAK, PA-C  isosorbide  mononitrate (IMDUR ) 30 MG 24 hr tablet Take 30 mg by mouth daily. 10/14/21   [provider]  lidocaine  (XYLOCAINE ) 2 % solution Use as directed 15 mLs in the mouth or throat every 3 (three) hours as needed for mouth pain (swish and spit). 03/17/24   Arvis Jolan NOVAK, PA-C  methocarbamol  (ROBAXIN ) 500 MG tablet Take 1 tablet (500 mg total) by mouth every 8 (eight) hours as needed. 04/18/24   Ward, Josette SAILOR, DO  metoprolol  succinate (TOPROL -XL) 100 MG 24 hr tablet Take 100 mg by mouth daily. 10/14/21   [provider]  mirtazapine  (REMERON ) 30 MG tablet Take 30 mg by mouth at bedtime. 07/10/23   [provider]  ondansetron  (ZOFRAN -ODT) 4 MG disintegrating tablet Take 1 tablet (4 mg total) by mouth every 6 (six) hours as needed for nausea or vomiting. 04/18/24   Ward, Josette SAILOR, DO  oxyCODONE  (OXY IR/ROXICODONE ) 5 MG immediate release tablet Take 5 mg by mouth every 4 (four) hours as needed.    [provider]  oxyCODONE -acetaminophen  (PERCOCET) 5-325 MG tablet Take 2 tablets by mouth every 8 (eight) hours as needed. 04/18/24 04/18/25  Ward, Josette SAILOR, DO  pantoprazole  (PROTONIX ) 40 MG tablet Take 40 mg by mouth daily. 08/28/23   [provider]  sertraline  (ZOLOFT ) 100 MG tablet Take  100 mg by mouth in the morning. 11/25/20   [provider]  triamcinolone ointment (KENALOG) 0.1 % Apply 1 Application topically 2 (two) times daily. 11/07/23   [provider]  VELPHORO 500 MG chewable tablet Chew 500 mg by mouth 3 (three) times daily with meals. 12/21/23   [provider]  VELTASSA  16.8 g PACK Take 1 packet by mouth daily. 11/18/23   [provider]    Physical Exam: Vitals:   05/12/24 0746 05/12/24 0749 05/12/24 0900 05/12/24 1000  BP:  117/79 96/60 125/77  Pulse:  80 76 78  Resp:  14    Temp:  97.8 F (36.6 C)    TempSrc:  Oral    SpO2:  95% 94% 90%  Weight: 68 kg     Height: 5' 9 (1.753 m)  Constitutional: NAD, calm, comfortable Vitals:   05/12/24 0746 05/12/24 0749 05/12/24 0900 05/12/24 1000  BP:  117/79 96/60 125/77  Pulse:  80 76 78  Resp:  14    Temp:  97.8 F (36.6 C)    TempSrc:  Oral    SpO2:  95% 94% 90%  Weight: 68 kg     Height: 5' 9 (1.753 m)      Eyes: PERRL, lids and conjunctivae normal ENMT: Mucous membranes are moist. Posterior pharynx clear of any exudate or lesions.Normal dentition.  Neck: normal, supple, no masses, no thyromegaly Respiratory: clear to auscultation bilaterally, no wheezing, no crackles. Normal respiratory effort. No accessory muscle use.  Cardiovascular: Regular rate and rhythm, no murmurs / rubs / gallops. No extremity edema. 2+ pedal pulses. No carotid bruits.  Abdomen: no tenderness, no masses palpated. No hepatosplenomegaly. Bowel sounds positive.  Musculoskeletal: no clubbing / cyanosis. No joint deformity upper and lower extremities. Good ROM, no contractures. Normal muscle tone.  Skin: no rashes, lesions, ulcers. No induration Neurologic: CN 2-12 grossly intact. Sensation intact, DTR normal. Strength 5/5 in all 4.  Psychiatric: Normal judgment and insight. Alert and oriented x 3. Normal mood.     Labs on Admission: I have personally reviewed following labs and imaging  studies  CBC: Recent Labs  Lab 05/12/24 0754  WBC 8.0  HGB 8.4*  HCT 28.8*  MCV 90.3  PLT 367   Basic Metabolic Panel: Recent Labs  Lab 05/12/24 0754  NA 131*  K 6.7*  CL 91*  CO2 21*  GLUCOSE 742*  BUN 71*  CREATININE 9.07*  CALCIUM  8.2*   GFR: Estimated Creatinine Clearance: 10.4 mL/min (A) (by C-G formula based on SCr of 9.07 mg/dL (H)). Liver Function Tests: No results for input(s): AST, ALT, ALKPHOS, BILITOT, PROT, ALBUMIN in the last 168 hours. No results for input(s): LIPASE, AMYLASE in the last 168 hours. No results for input(s): AMMONIA in the last 168 hours. Coagulation Profile: No results for input(s): INR, PROTIME in the last 168 hours. Cardiac Enzymes: No results for input(s): CKTOTAL, CKMB, CKMBINDEX, TROPONINI in the last 168 hours. BNP (last 3 results) No results for input(s): PROBNP in the last 8760 hours. HbA1C: No results for input(s): HGBA1C in the last 72 hours. CBG: Recent Labs  Lab 05/12/24 0742 05/12/24 0958 05/12/24 1022  GLUCAP >600* >600* >600*   Lipid Profile: No results for input(s): CHOL, HDL, LDLCALC, TRIG, CHOLHDL, LDLDIRECT in the last 72 hours. Thyroid Function Tests: No results for input(s): TSH, T4TOTAL, FREET4, T3FREE, THYROIDAB in the last 72 hours. Anemia Panel: No results for input(s): VITAMINB12, FOLATE, FERRITIN, TIBC, IRON , RETICCTPCT in the last 72 hours. Urine analysis: No results found for: COLORURINE, APPEARANCEUR, LABSPEC, PHURINE, GLUCOSEU, HGBUR, BILIRUBINUR, KETONESUR, PROTEINUR, UROBILINOGEN, NITRITE, LEUKOCYTESUR  Radiological Exams on Admission: No results found.  EKG: Independently reviewed.  Sinus rhythm, similar T wave morphology compared to previous EKGs.  Assessment/Plan Principal Problem:   DKA (diabetic ketoacidosis) (HCC) Active Problems:   PVD (peripheral vascular disease)   DKA, type 1 (HCC)    ESRD (end stage renal disease) on dialysis (HCC)  (please populate well all problems here in Problem List. (For example, if patient is on BP meds at home and you resume or decide to hold them, it is a problem that needs to be her. Same for CAD, COPD, HLD and so on)  DKA, mild Type 1 diabetes - Continue insulin  drip with Endo tool protocol, BMP every 4 hours - I  contacted patient's family, mother reported that she is in contact with insulin  pump company to figure out what might be the technical problem associated with new insulin  pump.  Hyperkalemia - Secondary to DKA - No significant T wave changes on EKG, patient received a cocktail of insulin  IV push, Lokelma , nephrology consulted and patient is to have emergency HD today.  ESRD on HD Worsening of uremia Worsening of azotemia - Secondary to dehydration likely, clinically there is no significant fluid overload. - HD today  CAD PVD status post recent left AKA - No acute concern, continue aspirin  Plavix  and statin and Zetia  - Labs AKI stump site clean, no signs of active infection.  HTN - Stable, continue Imdur  and metoprolol   Diabetic neuropathy - Continue at bedtime gabapentin   Chronic normocytic anemia secondary to ESRD - H&H stable, outpatient follow-up with nephrology for periodic EPO infusion  DVT prophylaxis: Heparin  subcu Code Status: Full code Family Communication: Mother over the phone Disposition Plan: Expect less than 2 midnight hospital stay Consults called: DM coordinator Admission status: Stepdown observation   Cort ONEIDA Mana MD [facility] Pager [phone]  05/12/2024, 10:27 AM

## 2024-05-12 NOTE — Progress Notes (Signed)
   05/12/24 1956  Vitals  Pulse Rate 80  ECG Heart Rate 81  Resp 18  Weight 63.3 kg  Type of Weight Post-Dialysis  Post Treatment  Dialyzer Clearance Clear  Liters Processed 54.3  Fluid Removed (mL) 1200 mL  Tolerated HD Treatment Yes  Post-Hemodialysis Comments Tx tolerated. 1200 removed. BFR reduced due to patient unable to leave arm straight due to contraction. Vs have remained stable. Hemostasis has been achieved. Patient has no verbalized concerns. Patient is stable  AVG/AVF Arterial Site Held (minutes) 5 minutes  AVG/AVF Venous Site Held (minutes) 5 minutes

## 2024-05-12 NOTE — Progress Notes (Signed)
 [facility]  ROUNDING NOTE   Subjective:   Raymond Lamb  is a 38 y.o. male with a history of ESRD on hemodialysis with left upper extremity AV fistula, type 1 diabetes with prior history of DKA, peripheral vascular disease, prior stroke, and hypertension.  Patient reports to the emergency room with hyperglycemia. He has been admitted under observation for Hyperkalemia [E87.5] DKA (diabetic ketoacidosis) (HCC) [E11.10] Diabetic ketoacidosis without coma associated with type 1 diabetes mellitus (HCC) [E10.10]   Patient is known to our practice from previous admissions and receives outpatient dialysis treatments at [facility] on a MWF schedule, supervised by [facility] physicians.  Patient is seen and evaluated at bedside in emergency department.  Patient very somnolent, difficult to arouse.  Currently on room air.  Labs on ED arrival including sodium 131, potassium 6.7, glucose 742, BUN 71, creatinine 9.07 with GFR 7, hemoglobin 8.4.  Chest x-ray shows mild interstitial edema with moderate cardiomegaly.  We have been consulted to manage dialysis needs.   Objective:  Vital signs in last 24 hours:  Temp:  [97.8 F (36.6 C)-98.6 F (37 C)] 98.6 F (37 C) (11/17 1128) Pulse Rate:  [73-82] 76 (11/17 1400) Resp:  [6-14] 6 (11/17 1400) BP: (96-152)/(60-91) 127/91 (11/17 1400) SpO2:  [90 %-100 %] 93 % (11/17 1128) Weight:  [60 kg-68 kg] 60 kg (11/17 1128)  Weight change:  Filed Weights   05/12/24 0746 05/12/24 1128  Weight: 68 kg 60 kg    Intake/Output: No intake/output data recorded.   Intake/Output this shift:  Total I/O In: 952.4 [I.V.:230.5; IV Piggyback:721.9] Out: -   Physical Exam: General: NAD  Head: Normocephalic, atraumatic. Moist oral mucosal membranes  Eyes: Anicteric  Lungs:  Clear to auscultation, normal effort  Heart: Regular rate and rhythm  Abdomen:  Soft, nontender  Extremities: Trace peripheral edema.  Neurologic: Somnolent  Skin:  Warm,dry, no rash  Access: Left upper AVF    Basic Metabolic Panel: Recent Labs  Lab 05/12/24 0754 05/12/24 1145  NA 131* 131*  K 6.7* 6.2*  CL 91* 92*  CO2 21* 22  GLUCOSE 742* 640*  BUN 71* 69*  CREATININE 9.07* 9.59*  CALCIUM  8.2* 8.5*    Liver Function Tests: Recent Labs  Lab 05/12/24 1145  AST 39  ALT 30  ALKPHOS 79  BILITOT 0.2  PROT 6.7  ALBUMIN 3.3*   No results for input(s): LIPASE, AMYLASE in the last 168 hours. No results for input(s): AMMONIA in the last 168 hours.  CBC: Recent Labs  Lab 05/12/24 0754  WBC 8.0  HGB 8.4*  HCT 28.8*  MCV 90.3  PLT 367    Cardiac Enzymes: No results for input(s): CKTOTAL, CKMB, CKMBINDEX, TROPONINI in the last 168 hours.  BNP: Invalid input(s): POCBNP  CBG: Recent Labs  Lab 05/12/24 1124 05/12/24 1207 05/12/24 1240 05/12/24 1310 05/12/24 1346  GLUCAP 584* 455* 534* 448* 433*    Microbiology: Results for orders placed or performed during the hospital encounter of 05/12/24  MRSA Next Gen by PCR, Nasal     Status: None   Collection Time: 05/12/24 11:31 AM   Specimen: Nasal Mucosa; Nasal Swab  Result Value Ref Range Status   MRSA by PCR Next Gen NOT DETECTED NOT DETECTED Final    Comment: (NOTE) The GeneXpert MRSA Assay (FDA approved for NASAL specimens only), is one component of a comprehensive MRSA colonization surveillance program. It is not intended to diagnose MRSA infection nor to guide or monitor treatment for MRSA  infections. Test performance is not FDA approved in patients less than 27 years old. Performed at [facility], [address] [place], [place] [address]     Coagulation Studies: No results for input(s): LABPROT, INR in the last 72 hours.  Urinalysis: No results for input(s): COLORURINE, LABSPEC, PHURINE, GLUCOSEU, HGBUR, BILIRUBINUR, KETONESUR, PROTEINUR, UROBILINOGEN, NITRITE, LEUKOCYTESUR in the last 72 hours.  Invalid  input(s): APPERANCEUR    Imaging: DG Chest 1 View Result Date: 05/12/2024 EXAM: 1 VIEW(S) XRAY OF THE CHEST 05/12/2024 10:16:00 AM COMPARISON: Comparison is 11/13/2021. CLINICAL HISTORY: 842629 DKA (diabetic ketoacidosis) (HCC) 842629; 798742 ESRD (end stage renal disease) (HCC) 201257 DKA (diabetic ketoacidosis) (HCC) 842629; 798742 ESRD (end stage renal disease) (HCC) 201257 FINDINGS: LINES, TUBES AND DEVICES: Multiple wires and leads project over the chest on the frontal radiograph. LUNGS AND PLEURA: Mild interstitial edema is new or increased. Moderate right hemidiaphragm elevation. No pleural effusion. No pneumothorax. HEART AND MEDIASTINUM: Moderate cardiomegaly. BONES AND SOFT TISSUES: No acute osseous abnormality. LIMITATIONS/ARTIFACTS: Patient rotated left. IMPRESSION: 1. Mild interstitial edema, new or increased. 2. Moderate cardiomegaly. 3. Oblique portable radiograph. Electronically signed by: Rockey Kilts MD 05/12/2024 10:44 AM EST RP Workstation: [id number]     Medications:    dextrose  5% lactated ringers      insulin  5 Units/hr (05/12/24 1420)   lactated ringers  100 mL/hr at 05/12/24 1425    aspirin   81 mg Oral Daily   atorvastatin   80 mg Oral QPM   Chlorhexidine  Gluconate Cloth  6 each Topical Q0600   cinacalcet   90 mg Oral q morning   clopidogrel   75 mg Oral Daily   docusate sodium   100 mg Oral BID   ezetimibe   10 mg Oral Daily   ferric citrate   420 mg Oral TID WC   gabapentin   600 mg Oral QHS   heparin   5,000 Units Subcutaneous Q8H   ipratropium  2 spray Each Nare QID   [START ON 05/13/2024] isosorbide  mononitrate  30 mg Oral Daily   metoprolol  tartrate  50 mg Oral BID   mirtazapine   30 mg Oral QHS   pantoprazole   40 mg Oral Daily   patiromer   2 packet Oral Daily   sertraline   100 mg Oral Daily   dextrose , lidocaine , methocarbamol , oxyCODONE -acetaminophen   Assessment/ Plan:  [name] is a 38 y.o.  male with a history of ESRD on hemodialysis with left  upper extremity AV fistula, type 1 diabetes with prior history of DKA, peripheral vascular disease, prior stroke, and hypertension.  Patient reports to the emergency room with hyperglycemia. 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  Hemoglobin A1c 9.4 on 02/20/2024.  Glucose greater than 700 on ED arrival.  Patient currently placed on insulin  drip.  End-stage renal disease with hyperkalemia on hemodialysis.  Potassium 6.7 on ED arrival, treated with shifting measures.  Currently 6.2.  Will plan for scheduled dialysis treatment today.  3. Anemia of chronic kidney disease Lab Results  Component Value Date   HGB 8.4 (L) 05/12/2024    Hemoglobin slightly decreased.  Patient receives Mircera at outpatient clinic. Will continue to monitor for right now.  4. Secondary Hyperparathyroidism: with outpatient labs: None available   Lab Results  Component Value Date   CALCIUM  8.5 (L) 05/12/2024   PHOS 3.6 11/14/2021    Will  continue to monitor bone minerals during this admission.   LOS: 0 Raymond Lamb 11/17/20252:34 PM

## 2024-05-12 NOTE — ED Notes (Signed)
 Pt here for dexcom reading high blood sugar. Pt only complaint is fatigue and feeling tired.

## 2024-05-12 NOTE — ED Notes (Signed)
 Insulin  pump removed. Observed that catheter was not penetrating patient's skin and it was bent

## 2024-05-12 NOTE — ED Notes (Signed)
 Attempted to call report to ICU. RN unable to receive report

## 2024-05-12 NOTE — ED Triage Notes (Signed)
 Pt arrives via POV with c/o hyperglycemia that started last night. Pt wears an insulin  pump and currently has it on his right upper thigh. Pt is A&Ox4. 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   Pt's CBG read high during triage.

## 2024-05-12 NOTE — Plan of Care (Signed)

## 2024-05-12 NOTE — Progress Notes (Signed)
 Pt receives outpt HD at [facility] on MWF @ 5:50am schedule. Navigator following to assist with any HD needs.  Suzen Satchel Dialysis Navigator [phone]

## 2024-05-13 DIAGNOSIS — I739 Peripheral vascular disease, unspecified: Secondary | ICD-10-CM

## 2024-05-13 DIAGNOSIS — E1010 Type 1 diabetes mellitus with ketoacidosis without coma: Secondary | ICD-10-CM | POA: Diagnosis not present

## 2024-05-13 LAB — GLUCOSE, CAPILLARY
Glucose-Capillary: 187 mg/dL — ABNORMAL HIGH (ref 70–99)
Glucose-Capillary: 152 mg/dL — ABNORMAL HIGH (ref 70–99)

## 2024-05-13 LAB — BASIC METABOLIC PANEL WITH GFR
Anion gap: 14 (ref 5–15)
BUN: 40 mg/dL — ABNORMAL HIGH (ref 6–20)
CO2: 26 mmol/L (ref 22–32)
Calcium: 8.3 mg/dL — ABNORMAL LOW (ref 8.9–10.3)
Chloride: 94 mmol/L — ABNORMAL LOW (ref 98–111)
Creatinine, Ser: 6.5 mg/dL — ABNORMAL HIGH (ref 0.61–1.24)
GFR, Estimated: 10 mL/min — ABNORMAL LOW (ref >60)
Glucose, Bld: 191 mg/dL — ABNORMAL HIGH (ref 70–99)
Potassium: 4.8 mmol/L (ref 3.5–5.1)
Sodium: 133 mmol/L — ABNORMAL LOW (ref 135–145)

## 2024-05-13 LAB — PHOSPHORUS: Phosphorus: 5.6 mg/dL — ABNORMAL HIGH (ref 2.5–4.6)

## 2024-05-13 MED ORDER — METOPROLOL TARTRATE 50 MG PO TABS: 50.0000 mg | ORAL_TABLET | Freq: Two times a day (BID) | ORAL | 0 refills | Status: AC

## 2024-05-13 NOTE — Progress Notes (Signed)
 [facility]  ROUNDING NOTE   Subjective:   Raymond Lamb  is a 38 y.o. male with a history of ESRD on hemodialysis with left upper extremity AV fistula, type 1 diabetes with prior history of DKA, peripheral vascular disease, prior stroke, and hypertension.  Patient reports to the emergency room with hyperglycemia. He has been admitted under observation for Hyperkalemia [E87.5] DKA (diabetic ketoacidosis) (HCC) [E11.10] Diabetic ketoacidosis without coma associated with type 1 diabetes mellitus (HCC) [E10.10]   Patient is known to our practice from previous admissions and receives outpatient dialysis treatments at [facility] on a MWF schedule, supervised by [facility] physicians.    Patient seen laying in bed Fully Alert and oriented States his insulin  pump malfunctioned outpatient States she is ready for discharge  Objective:  Vital signs in last 24 hours:  Temp:  [97.6 F (36.4 C)-99.4 F (37.4 C)] 97.8 F (36.6 C) (11/18 0800) Pulse Rate:  [69-112] 84 (11/18 0800) Resp:  [3-23] 11 (11/18 0800) BP: (92-168)/(54-100) 124/83 (11/18 0800) SpO2:  [93 %-100 %] 93 % (11/18 0800) Weight:  [60 kg-64.7 kg] 63.3 kg (11/17 1956)  Weight change:  Filed Weights   05/12/24 1128 05/12/24 1600 05/12/24 1956  Weight: 60 kg 64.7 kg 63.3 kg    Intake/Output: I/O last 3 completed shifts: In: 1852.7 [I.V.:485.1; IV Piggyback:1367.6] Out: 1200 [Other:1200]   Intake/Output this shift:  No intake/output data recorded.  Physical Exam: General: NAD  Head: Normocephalic, atraumatic. Moist oral mucosal membranes  Eyes: Anicteric  Lungs:  Clear to auscultation, normal effort  Heart: Regular rate and rhythm  Abdomen:  Soft, nontender  Extremities: Trace peripheral edema.  Neurologic: Alert and oriented  Skin: Warm,dry, no rash  Access: Left upper AVF    Basic Metabolic Panel: Recent Labs  Lab 05/12/24 0754 05/12/24 1145 05/12/24 1551 05/13/24 0318  NA 131* 131* 136  133*  K 6.7* 6.2* 5.5* 4.8  CL 91* 92* 95* 94*  CO2 21* 22 22 26   GLUCOSE 742* 640* 303* 191*  BUN 71* 69* 68* 40*  CREATININE 9.07* 9.59* 9.52* 6.50*  CALCIUM  8.2* 8.5* 8.6* 8.3*    Liver Function Tests: Recent Labs  Lab 05/12/24 1145  AST 39  ALT 30  ALKPHOS 79  BILITOT 0.2  PROT 6.7  ALBUMIN 3.3*   No results for input(s): LIPASE, AMYLASE in the last 168 hours. No results for input(s): AMMONIA in the last 168 hours.  CBC: Recent Labs  Lab 05/12/24 0754  WBC 8.0  HGB 8.4*  HCT 28.8*  MCV 90.3  PLT 367    Cardiac Enzymes: No results for input(s): CKTOTAL, CKMB, CKMBINDEX, TROPONINI in the last 168 hours.  BNP: Invalid input(s): POCBNP  CBG: Recent Labs  Lab 05/12/24 1706 05/12/24 1802 05/12/24 1856 05/12/24 2125 05/13/24 0807  GLUCAP 153* 118* 104* 132* 187*    Microbiology: Results for orders placed or performed during the hospital encounter of 05/12/24  MRSA Next Gen by PCR, Nasal     Status: None   Collection Time: 05/12/24 11:31 AM   Specimen: Nasal Mucosa; Nasal Swab  Result Value Ref Range Status   MRSA by PCR Next Gen NOT DETECTED NOT DETECTED Final    Comment: (NOTE) The GeneXpert MRSA Assay (FDA approved for NASAL specimens only), is one component of a comprehensive MRSA colonization surveillance program. It is not intended to diagnose MRSA infection nor to guide or monitor treatment for MRSA infections. Test performance is not FDA approved in patients less than  59 years old. Performed at [facility], [address] [place], [place] [address]     Coagulation Studies: No results for input(s): LABPROT, INR in the last 72 hours.  Urinalysis: No results for input(s): COLORURINE, LABSPEC, PHURINE, GLUCOSEU, HGBUR, BILIRUBINUR, KETONESUR, PROTEINUR, UROBILINOGEN, NITRITE, LEUKOCYTESUR in the last 72 hours.  Invalid input(s): APPERANCEUR    Imaging: DG Chest 1 View Result Date:  05/12/2024 EXAM: 1 VIEW(S) XRAY OF THE CHEST 05/12/2024 10:16:00 AM COMPARISON: Comparison is 11/13/2021. CLINICAL HISTORY: 842629 DKA (diabetic ketoacidosis) (HCC) 842629; 798742 ESRD (end stage renal disease) (HCC) 201257 DKA (diabetic ketoacidosis) (HCC) 842629; 798742 ESRD (end stage renal disease) (HCC) 201257 FINDINGS: LINES, TUBES AND DEVICES: Multiple wires and leads project over the chest on the frontal radiograph. LUNGS AND PLEURA: Mild interstitial edema is new or increased. Moderate right hemidiaphragm elevation. No pleural effusion. No pneumothorax. HEART AND MEDIASTINUM: Moderate cardiomegaly. BONES AND SOFT TISSUES: No acute osseous abnormality. LIMITATIONS/ARTIFACTS: Patient rotated left. IMPRESSION: 1. Mild interstitial edema, new or increased. 2. Moderate cardiomegaly. 3. Oblique portable radiograph. Electronically signed by: Rockey Kilts MD 05/12/2024 10:44 AM EST RP Workstation: [id number]     Medications:      aspirin   81 mg Oral Daily   atorvastatin   80 mg Oral QPM   Chlorhexidine  Gluconate Cloth  6 each Topical Q0600   cinacalcet   90 mg Oral q morning   clopidogrel   75 mg Oral Daily   docusate sodium   100 mg Oral BID   ezetimibe   10 mg Oral Daily   ferric citrate   420 mg Oral TID WC   gabapentin   600 mg Oral QHS   heparin   5,000 Units Subcutaneous Q8H   insulin  aspart  0-6 Units Subcutaneous TID WC   insulin  aspart  3 Units Subcutaneous TID WC   insulin  glargine-yfgn  8 Units Subcutaneous Daily   ipratropium  2 spray Each Nare QID   isosorbide  mononitrate  30 mg Oral Daily   metoprolol  tartrate  50 mg Oral BID   mirtazapine   30 mg Oral QHS   pantoprazole   40 mg Oral Daily   patiromer   2 packet Oral Daily   sertraline   100 mg Oral Daily   dextrose , lidocaine , methocarbamol , mouth rinse, oxyCODONE -acetaminophen   Assessment/ Plan:  Raymond Lamb is a 38 y.o.  male with a history of ESRD on hemodialysis with left upper extremity AV fistula, type 1 diabetes  with prior history of DKA, peripheral vascular disease, prior stroke, and hypertension.  Patient reports to the emergency room with hyperglycemia. 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  Hemoglobin A1c 9.4 on 02/20/2024.  Glucose greater than 700 on ED arrival.   Weaned off insulin  drip, managed with SSI at this time.   End-stage renal disease with hyperkalemia on hemodialysis.  Potassium 6.7 on ED arrival, treated with shifting measures.  Potassium corrected 4.8. Treatment received yesterday, UF 1.2L achieved. Next dialysis scheduled for Wednesday.   3. Anemia of chronic kidney disease Lab Results  Component Value Date   HGB 8.4 (L) 05/12/2024    Patient receives Mircera at outpatient clinic. Will continue to monitor for right now.  4. Secondary Hyperparathyroidism: with outpatient labs: None available   Lab Results  Component Value Date   CALCIUM  8.3 (L) 05/13/2024   PHOS 3.6 11/14/2021  Calcium  stable. Awaiting phos update.    LOS: 0 Karolina Zamor 11/18/202511:20 AM

## 2024-05-13 NOTE — Discharge Summary (Signed)
 DISCHARGE SUMMARY    Raymond Lamb FMW:[id number] DOB: January 18, 1986 DOA: 05/12/2024  [facility]  Admit date: 05/12/2024 Discharge date: 05/13/2024   Recommendations for Outpatient Follow-up:  Follow up with PCP in 1-2 weeks to continue chronic condition management Follow-up with all specialist as advised at recent hospital discharge Continue receiving dialysis on your normal schedule   Hospital Course: Raymond Lamb is a 38 year old male with insulin -dependent diabetes on insulin  pump, CAD status post PCI and stenting, PAD status post recent AKA, ESRD on HD MWF, HTN, HLD, who presented with malaise, lethargy, nausea.  He was found to be in DKA and critical hyperkalemia with a potassium of 6.2.  He was admitted for management.  He underwent hemodialysis on 11/17.  Blood glucose normalized.  DKA appears to be secondary to a malfunctioning insulin  pump, reportedly tubing was kinked.  By evaluation on 11/18 patient was at his physiologic baseline and requesting for discharge home. CBGs consistently under 200.  I discussion with the patient as well as with his mom regarding insulin  pump management.  The patient's mother typically changes his insulin  pump the patient has recently taken ownership of this and is not as familiar.  The patient's mother confirmed that she will help apply a new insulin  pump today when he discharges home from the hospital.  Patient is well-established outpatient with hemodialysis and primary care team.  Type I diabetes DKA - Was on insulin  drip, transitioned to subcu.  CBGs consistently below 200 - Diabetes educator consulted - Malfunctioning insulin  pump.  Discussed with mom.  She will change insulin  pump today  Hyperkalemia - 6.2 on arrival, status post dialysis.  Resolved now  Hypertension - Decrease metoprolol .  New Rx sent to pharmacy  Chronic conditions: ESRD on HD CAD PVD Recent left AKA Diabetic neuropathy Chronic normocytic anemia  secondary to ESRD - Continue home medications without changes  Discharge Instructions  Discharge Instructions     Call MD for:  difficulty breathing, headache or visual disturbances   Complete by: As directed    Call MD for:  persistant dizziness or light-headedness   Complete by: As directed    Call MD for:  persistant nausea and vomiting   Complete by: As directed    Call MD for:  severe uncontrolled pain   Complete by: As directed    Call MD for:  temperature >100.4   Complete by: As directed    Diet general   Complete by: As directed    Discharge instructions   Complete by: As directed    Continue to monitor your blood pressure and blood glucose closely. Follow up with your primary care physician to review your log and make medication changes as necessary.  While admitted we made some changes to your blood pressure medications. Please review your discharge medications closely to ensure you are taking the correct meds at home. Please take your blood pressure once a day and keep a log.   For Blood Pressure Monitor home BP readings. Goal: 140/90 or less Bring all BP readings with you to your office visits Bring your home BP cuff with you to your office visits  See your primary care doctor in one week to review this log and make further medication changes.   Increase activity slowly   Complete by: As directed       Allergies as of 05/13/2024       Reactions   Baclofen Itching, Other (See Comments)   Became disoriented, emesis   Camellia  Swelling   Iodinated Contrast Media Hives   Lisinopril Swelling   Other Swelling   Oxybenzone    Other reaction(s): Unknown        Medication List     STOP taking these medications    lidocaine  2 % solution Commonly known as: XYLOCAINE    methocarbamol  500 MG tablet Commonly known as: ROBAXIN    metoprolol  succinate 100 MG 24 hr tablet Commonly known as: TOPROL -XL   oxyCODONE -acetaminophen  5-325 MG tablet Commonly  known as: Percocet   Velphoro 500 MG chewable tablet Generic drug: sucroferric oxyhydroxide       TAKE these medications    aspirin  81 MG chewable tablet Chew 81 mg by mouth daily.   atorvastatin  80 MG tablet Commonly known as: LIPITOR  Take 80 mg by mouth every evening.   Auryxia  1 GM 210 MG(Fe) tablet Generic drug: ferric citrate  Take 420 mg by mouth 3 (three) times daily with meals.   Cholecalciferol 50 MCG (2000 UT) Tabs Take 1 tablet by mouth daily.   cinacalcet  90 MG tablet Commonly known as: SENSIPAR  Take 90 mg by mouth every morning.   clopidogrel  75 MG tablet Commonly known as: PLAVIX  Take 75 mg by mouth daily.   docusate sodium  100 MG capsule Commonly known as: Colace Take 1 capsule (100 mg total) by mouth 2 (two) times daily.   ezetimibe  10 MG tablet Commonly known as: ZETIA  Take 10 mg by mouth daily.   gabapentin  300 MG capsule Commonly known as: NEURONTIN  Take 600 mg by mouth at bedtime.   insulin  lispro 100 UNIT/ML injection Commonly known as: HUMALOG Inject 10 Units into the skin daily. Uses in Pump   ipratropium 0.06 % nasal spray Commonly known as: ATROVENT  Place 2 sprays into both nostrils 4 (four) times daily.   isosorbide  mononitrate 30 MG 24 hr tablet Commonly known as: IMDUR  Take 30 mg by mouth daily.   metoprolol  tartrate 50 MG tablet Commonly known as: LOPRESSOR  Take 1 tablet (50 mg total) by mouth 2 (two) times daily.   mirtazapine  30 MG tablet Commonly known as: REMERON  Take 30 mg by mouth at bedtime.   ondansetron  4 MG disintegrating tablet Commonly known as: ZOFRAN -ODT Take 1 tablet (4 mg total) by mouth every 6 (six) hours as needed for nausea or vomiting.   oxyCODONE  5 MG immediate release tablet Commonly known as: Oxy IR/ROXICODONE  Take 5 mg by mouth every 4 (four) hours as needed.   pantoprazole  40 MG tablet Commonly known as: PROTONIX  Take 40 mg by mouth daily.   sertraline  100 MG tablet Commonly known as:  ZOLOFT  Take 100 mg by mouth in the morning.   triamcinolone ointment 0.1 % Commonly known as: KENALOG Apply 1 Application topically 2 (two) times daily.   Veltassa  16.8 g Pack Generic drug: Patiromer  Sorbitex Calcium  Take 1 packet by mouth daily.        Allergies  Allergen Reactions   Baclofen Itching and Other (See Comments)    Became disoriented, emesis   Camellia Swelling   Iodinated Contrast Media Hives   Lisinopril Swelling   Other Swelling   Oxybenzone     Other reaction(s): Unknown    Consultations: Treatment Team:  Dennise Capri, MD   Procedures/Studies: DG Chest 1 View Result Date: 05/12/2024 EXAM: 1 VIEW(S) XRAY OF THE CHEST 05/12/2024 10:16:00 AM COMPARISON: Comparison is 11/13/2021. CLINICAL HISTORY: 842629 DKA (diabetic ketoacidosis) (HCC) 842629; 798742 ESRD (end stage renal disease) (HCC) 798742 DKA (diabetic ketoacidosis) (HCC) 842629; 798742 ESRD (end stage renal  disease) (HCC) 201257 FINDINGS: LINES, TUBES AND DEVICES: Multiple wires and leads project over the chest on the frontal radiograph. LUNGS AND PLEURA: Mild interstitial edema is new or increased. Moderate right hemidiaphragm elevation. No pleural effusion. No pneumothorax. HEART AND MEDIASTINUM: Moderate cardiomegaly. BONES AND SOFT TISSUES: No acute osseous abnormality. LIMITATIONS/ARTIFACTS: Patient rotated left. IMPRESSION: 1. Mild interstitial edema, new or increased. 2. Moderate cardiomegaly. 3. Oblique portable radiograph. Electronically signed by: Rockey Kilts MD 05/12/2024 10:44 AM EST RP Workstation: [id number]      Discharge Exam: Vitals:   05/13/24 1000 05/13/24 1100  BP: 128/72 (!) 94/57  Pulse: 85 81  Resp: 11 11  Temp:    SpO2: 93% 93%   Vitals:   05/13/24 0800 05/13/24 0900 05/13/24 1000 05/13/24 1100  BP: 124/83 115/67 128/72 (!) 94/57  Pulse: 84 81 85 81  Resp: 11 14 11 11   Temp: 97.8 F (36.6 C)     TempSrc: Oral     SpO2: 93% 93% 93% 93%  Weight:      Height:         Constitutional:  Normal appearance. Non toxic-appearing.  HENT: Head Normocephalic and atraumatic.  Mucous membranes are moist.  Eyes:  Extraocular intact. Conjunctivae normal.  Cardiovascular: Rate and Rhythm: Normal rate and regular rhythm.  Pulmonary: Non labored, symmetric rise of chest wall.  Neurological: alert. Oriented.  Psychiatric: Mood and Affect congruent.    The results of significant diagnostics from this hospitalization (including imaging, microbiology, ancillary and laboratory) are listed below for reference.     Microbiology: Recent Results (from the past 240 hours)  MRSA Next Gen by PCR, Nasal     Status: None   Collection Time: 05/12/24 11:31 AM   Specimen: Nasal Mucosa; Nasal Swab  Result Value Ref Range Status   MRSA by PCR Next Gen NOT DETECTED NOT DETECTED Final    Comment: (NOTE) The GeneXpert MRSA Assay (FDA approved for NASAL specimens only), is one component of a comprehensive MRSA colonization surveillance program. It is not intended to diagnose MRSA infection nor to guide or monitor treatment for MRSA infections. Test performance is not FDA approved in patients less than 26 years old. Performed at [facility], [address] [place], [place] [address]      Labs: BNP (last 3 results) No results for input(s): BNP in the last 8760 hours. Basic Metabolic Panel: Recent Labs  Lab 05/12/24 0754 05/12/24 1145 05/12/24 1551 05/13/24 0318  NA 131* 131* 136 133*  K 6.7* 6.2* 5.5* 4.8  CL 91* 92* 95* 94*  CO2 21* 22 22 26   GLUCOSE 742* 640* 303* 191*  BUN 71* 69* 68* 40*  CREATININE 9.07* 9.59* 9.52* 6.50*  CALCIUM  8.2* 8.5* 8.6* 8.3*   Liver Function Tests: Recent Labs  Lab 05/12/24 1145  AST 39  ALT 30  ALKPHOS 79  BILITOT 0.2  PROT 6.7  ALBUMIN 3.3*   No results for input(s): LIPASE, AMYLASE in the last 168 hours. No results for input(s): AMMONIA in the last 168 hours. CBC: Recent Labs  Lab 05/12/24 0754   WBC 8.0  HGB 8.4*  HCT 28.8*  MCV 90.3  PLT 367   Cardiac Enzymes: No results for input(s): CKTOTAL, CKMB, CKMBINDEX, TROPONINI in the last 168 hours. BNP: Invalid input(s): POCBNP CBG: Recent Labs  Lab 05/12/24 1802 05/12/24 1856 05/12/24 2125 05/13/24 0807 05/13/24 1149  GLUCAP 118* 104* 132* 187* 152*   D-Dimer No results for input(s): DDIMER in the last 72  hours. Hgb A1c No results for input(s): HGBA1C in the last 72 hours. Lipid Profile No results for input(s): CHOL, HDL, LDLCALC, TRIG, CHOLHDL, LDLDIRECT in the last 72 hours. Thyroid function studies No results for input(s): TSH, T4TOTAL, T3FREE, THYROIDAB in the last 72 hours.  Invalid input(s): FREET3 Anemia work up No results for input(s): VITAMINB12, FOLATE, FERRITIN, TIBC, IRON , RETICCTPCT in the last 72 hours. Urinalysis No results found for: COLORURINE, APPEARANCEUR, LABSPEC, PHURINE, GLUCOSEU, HGBUR, BILIRUBINUR, KETONESUR, PROTEINUR, UROBILINOGEN, NITRITE, LEUKOCYTESUR Sepsis Labs Recent Labs  Lab 05/12/24 0754  WBC 8.0   Microbiology Recent Results (from the past 240 hours)  MRSA Next Gen by PCR, Nasal     Status: None   Collection Time: 05/12/24 11:31 AM   Specimen: Nasal Mucosa; Nasal Swab  Result Value Ref Range Status   MRSA by PCR Next Gen NOT DETECTED NOT DETECTED Final    Comment: (NOTE) The GeneXpert MRSA Assay (FDA approved for NASAL specimens only), is one component of a comprehensive MRSA colonization surveillance program. It is not intended to diagnose MRSA infection nor to guide or monitor treatment for MRSA infections. Test performance is not FDA approved in patients less than 58 years old. Performed at [facility], [address], [place], [place] [address]      Time coordinating discharge: 32 min   SIGNED: Lorane Poland, DO [facility] 05/13/2024, 11:57 AM Pager   If 7PM-7AM,  please contact night-coverage

## 2024-05-13 NOTE — Care Management Obs Status (Signed)
 MEDICARE OBSERVATION STATUS NOTIFICATION   Patient Details  Name: Raymond Lamb MRN: [id number] Date of Birth: 1985/09/01   Medicare Observation Status Notification Given:  Yes    Rojelio SHAUNNA Rattler 05/13/2024, 11:57 AM

## 2024-05-13 NOTE — Progress Notes (Signed)
 D/C order noted. Contacted [facility] on MWF @ 5:50a advised of pt and d/c. Requested documents faxed to clinic for continuation of care.  Suzen Satchel Dialysis Navigator [phone]

## 2024-05-13 NOTE — Inpatient Diabetes Management (Signed)
 Inpatient Diabetes Program Recommendations  AACE/ADA: New Consensus Statement on Inpatient Glycemic Control (2015)  Target Ranges:  Prepandial:   less than 140 mg/dL      Peak postprandial:   less than 180 mg/dL (1-2 hours)      Critically ill patients:  140 - 180 mg/dL   Lab Results  Component Value Date   GLUCAP 187 (H) 05/13/2024   HGBA1C 9.4 (H) 02/20/2024    Review of Glycemic Control  Latest Reference Range & Units 05/12/24 18:56 05/12/24 21:25 05/13/24 08:07  Glucose-Capillary 70 - 99 mg/dL 895 (H) 867 (H) 812 (H)  Type 1 DM Outpatient Diabetes medications: OmniPod 5 (basal 0.35 units/hr (8.4 units per 24 hours); I:CR 1:1 (pt. Put in 1 ot 2 for CHO coverage), I:SF 1:90 mg/dl); off pump plan Lantus  8 units at bedtime, insulin  lispro 3 units TID with meals plus 1:50 mg/dl >859 mg/dl for correction  Current orders for Inpatient glycemic control:  Novolog  0-6 units tid with meals  Semglee  8 units daily Novolog  3 units tid with meals   Inpatient Diabetes Program Recommendations:    Patient did well with transition off insulin  drip.  Hopefully mom can help him restart insulin  pump this evening.  Will follow.   Thanks,  Randall Bullocks, RN, BC-ADM Inpatient Diabetes Coordinator Pager [phone]  (8a-5p)

## 2024-05-13 NOTE — Inpatient Diabetes Management (Signed)
 Note plans for discharge.  I spoke with mother by phone and let her know that patient did receive basal insulin -Semglee  this AM.  Therefore she should wait to restart insulin  pump til tomorrow morning.  She states that Alen has insulin  pen with Novolog  so that he can bolus for meal this evening.  She will help the patient restart the pump in the morning.  She verbalized understanding.    Thanks,  Randall Bullocks, RN, BC-ADM Inpatient Diabetes Coordinator Pager [phone]  (8a-5p)

## 2024-05-13 NOTE — Plan of Care (Signed)
  Problem: Education: Goal: Knowledge of General Education information will improve Description: Including pain rating scale, medication(s)/side effects and non-pharmacologic comfort measures Outcome: Progressing   Problem: Clinical Measurements: Goal: Ability to maintain clinical measurements within normal limits will improve Outcome: Progressing Goal: Diagnostic test results will improve Outcome: Progressing Goal: Cardiovascular complication will be avoided Outcome: Progressing   Problem: Cardiac: Goal: Ability to maintain an adequate cardiac output will improve Outcome: Progressing

## 2024-05-28 NOTE — Telephone Encounter (Signed)
 Pt mom confirmed BP was elevated after dialysis. Mom reports BP decreased some prior to taken medication and reported pt has since taken BP medication. Mom agreed to retake BP once she arrives home and send results via mychart.

## 2024-05-29 NOTE — Telephone Encounter (Signed)
 [facility] called yesterday and left a message concerning RaymondLamb Sensipar  (90mg  once daily) medication they said that his packs are dued for a refill and it was last filled on 04/25/24.

## 2024-06-04 NOTE — Telephone Encounter (Signed)
------------------------------------------------------------------------------- °  Summary: [facility] -------------------------------------------------------------------------------  I have placed a form in your box requiring your review.    Type of Form -Client Care Coordination Note Report    Order/Tracking # n/a    Thank You

## 2024-06-20 ENCOUNTER — Other Ambulatory Visit: Payer: Self-pay

## 2024-06-20 ENCOUNTER — Encounter: Payer: Self-pay | Admitting: Emergency Medicine

## 2024-06-20 ENCOUNTER — Emergency Department
Admission: EM | Admit: 2024-06-20 | Discharge: 2024-06-20 | Disposition: A | Discharge status: Home / Self Care | Attending: Emergency Medicine | Admitting: Emergency Medicine

## 2024-06-20 DIAGNOSIS — N186 End stage renal disease: Secondary | ICD-10-CM | POA: Insufficient documentation

## 2024-06-20 DIAGNOSIS — E1022 Type 1 diabetes mellitus with diabetic chronic kidney disease: Secondary | ICD-10-CM | POA: Insufficient documentation

## 2024-06-20 DIAGNOSIS — E875 Hyperkalemia: Secondary | ICD-10-CM | POA: Diagnosis not present

## 2024-06-20 DIAGNOSIS — I2510 Atherosclerotic heart disease of native coronary artery without angina pectoris: Secondary | ICD-10-CM | POA: Diagnosis not present

## 2024-06-20 DIAGNOSIS — R197 Diarrhea, unspecified: Secondary | ICD-10-CM | POA: Diagnosis present

## 2024-06-20 DIAGNOSIS — I120 Hypertensive chronic kidney disease with stage 5 chronic kidney disease or end stage renal disease: Secondary | ICD-10-CM | POA: Insufficient documentation

## 2024-06-20 DIAGNOSIS — Z992 Dependence on renal dialysis: Secondary | ICD-10-CM | POA: Insufficient documentation

## 2024-06-20 LAB — CBC
HCT: 47.5 % (ref 39.0–52.0)
Hemoglobin: 14.6 g/dL (ref 13.0–17.0)
MCH: 26.4 pg (ref 26.0–34.0)
MCHC: 30.7 g/dL (ref 30.0–36.0)
MCV: 85.7 fL (ref 80.0–100.0)
Platelets: 144 K/uL — ABNORMAL LOW (ref 150–400)
RBC: 5.54 MIL/uL (ref 4.22–5.81)
RDW: 21 % — ABNORMAL HIGH (ref 11.5–15.5)
WBC: 5.8 K/uL (ref 4.0–10.5)
nRBC: 0 % (ref 0.0–0.2)

## 2024-06-20 LAB — COMPREHENSIVE METABOLIC PANEL WITH GFR
ALT: 27 U/L (ref 0–44)
AST: 13 U/L — ABNORMAL LOW (ref 15–41)
Albumin: 4.7 g/dL (ref 3.5–5.0)
Alkaline Phosphatase: 95 U/L (ref 38–126)
Anion gap: 18 — ABNORMAL HIGH (ref 5–15)
BUN: 71 mg/dL — ABNORMAL HIGH (ref 6–20)
CO2: 27 mmol/L (ref 22–32)
Calcium: 9.1 mg/dL (ref 8.9–10.3)
Chloride: 91 mmol/L — ABNORMAL LOW (ref 98–111)
Creatinine, Ser: 9.09 mg/dL — ABNORMAL HIGH (ref 0.61–1.24)
GFR, Estimated: 7 mL/min — ABNORMAL LOW
Glucose, Bld: 251 mg/dL — ABNORMAL HIGH (ref 70–99)
Potassium: 6.1 mmol/L — ABNORMAL HIGH (ref 3.5–5.1)
Sodium: 136 mmol/L (ref 135–145)
Total Bilirubin: 0.3 mg/dL (ref 0.0–1.2)
Total Protein: 7.9 g/dL (ref 6.5–8.1)

## 2024-06-20 LAB — LIPASE, BLOOD: Lipase: 85 U/L — ABNORMAL HIGH (ref 11–51)

## 2024-06-20 LAB — CBG MONITORING, ED: Glucose-Capillary: 93 mg/dL (ref 70–99)

## 2024-06-20 MED ORDER — SODIUM CHLORIDE 0.9 % IV BOLUS
1000.0000 mL | Freq: Once | INTRAVENOUS | Status: AC
  Administered 2024-06-20: 1000 mL via INTRAVENOUS

## 2024-06-20 MED ORDER — CHLORHEXIDINE GLUCONATE CLOTH 2 % EX PADS
6.0000 | MEDICATED_PAD | Freq: Every day | CUTANEOUS | Status: DC
  Administered 2024-06-20: 6 via TOPICAL
  Filled 2024-06-20: qty 6

## 2024-06-20 NOTE — ED Notes (Signed)
 Dialysis consent signed and left in room with pt

## 2024-06-20 NOTE — Progress Notes (Signed)
 Hemodialysis Note:  Received patient in wheelchair to unit. Alert and oriented. Informed consent singed and in chart.  Treatment initiated: 1215 Treatment completed: 1621  Access used: Left AVF Access issues: None  Patient tolerated well. Transported back to room, alert without acute distress. Report given to patient's RN.  Total UF removed: 1 Liter Medications given: None  Post HD weight: 56.8 Kg  [facility]

## 2024-06-20 NOTE — ED Notes (Signed)
 At this time, this EDT provided a lunch tray.

## 2024-06-20 NOTE — Inpatient Diabetes Management (Addendum)
 Inpatient Diabetes Program Recommendations  AACE/ADA: New Consensus Statement on Inpatient Glycemic Control (2015)  Target Ranges:  Prepandial:   less than 140 mg/dL      Peak postprandial:   less than 180 mg/dL (1-2 hours)      Critically ill patients:  140 - 180 mg/dL    Latest Reference Range & Units 06/20/24 07:26  Sodium 135 - 145 mmol/L 136  Potassium 3.5 - 5.1 mmol/L 6.1 (H)  Chloride 98 - 111 mmol/L 91 (L)  CO2 22 - 32 mmol/L 27  Glucose 70 - 99 mg/dL 748 (H)  BUN 6 - 20 mg/dL 71 (H)  Creatinine 9.38 - 1.24 mg/dL 0.90 (H)  Calcium  8.9 - 10.3 mg/dL 9.1  Anion gap 5 - 15  18 (H)  (H): Data is abnormally high (L): Data is abnormally low    To ED with Diarrhea/ Hyperkalemia   History: Type 1 diabetes, ESRD  Home DM Meds: OmniPod 5 Insulin  Pump with Humalog  Current Orders: None yet    Secure Chat sent to RN in the dialysis unit to check and see if pt currently wearing/using insulin  pump.  RN stated pt does currently have insulin  pump on and running.    MD- If pt admitted, please Add Orders for Insulin  Pump Therapy  TID AC + HS + 2am   ENDO: [facility]. Storm Last Seen by Diabetes Educator Rosario Na on 02/28/2024 Adjustments made to Insulin  Pump Omnipod 5 pump Basal:  12 am basal 0.3 U/hr 11 am basal 0.3--->0.45 U/hr  Carb ratio:  12 am 1:1   ISF:12 am: 1: 85--->80 mg/dl  BG target : 12 am 869 mg/dl  AIT: 4 hours Max bolus: 10--->15 U Mas basal rate: 3U/hr    --Will follow patient during hospitalization--  Adina Rudolpho Arrow RN, MSN, CDCES Diabetes Coordinator Inpatient Glycemic Control Team Team Pager: [phone] (8a-5p)

## 2024-06-20 NOTE — ED Provider Notes (Signed)
 Procedures     ----------------------------------------- 5:33 PM on 06/20/2024 -----------------------------------------  Patient seen and examined by myself after returning from hemodialysis.  He denies any complaints.  He is lying supine, breathing comfortably.  No edema.  Feels comfortable with discharge home, and appears to be in his baseline state of health.  He had presented due to difficulty with his outpatient dialysis schedule during this holiday week, now has had dialysis and is appropriate to continue his routine outpatient schedule.  Final diagnoses:  Diarrhea, unspecified type  Hyperkalemia  Dialysis patient  ESRD on hemodialysis [facility])       Viviann Pastor, MD 06/20/24 1734

## 2024-06-20 NOTE — ED Notes (Signed)
 Pt gives verbal permission to give his mother an update on his status

## 2024-06-20 NOTE — ED Provider Notes (Signed)
 "  [facility] Provider Note    Event Date/Time   First MD Initiated Contact with Patient 06/20/24 [phone]     (approximate)   History   Diarrhea   HPI  Raymond Lamb is a 38 y.o. male   presents to the ED with complaint of diarrhea for 2 days.  Patient states the last 24 hours he has had diarrhea approximately 4 times.  He denies any fever, chills, vomiting or bodyaches.  No known sick exposures.  He denies any abdominal pain.  He is also a dialysis patient with his last dialysis treatment being on Tuesday.  He is seen at the dialysis unit in [place] and is unable to get transportation there today.  Patient has history of CVA, hypertension, type 1 diabetes, CAD, PVD, end-stage renal disease.  Patient also is a BKA left leg.      Physical Exam   Triage Vital Signs: ED Triage Vitals  Encounter Vitals Group     BP 06/20/24 0723 (!) 133/93     Girls Systolic BP Percentile --      Girls Diastolic BP Percentile --      Boys Systolic BP Percentile --      Boys Diastolic BP Percentile --      Pulse Rate 06/20/24 0723 67     Resp 06/20/24 0723 18     Temp 06/20/24 0723 98 F (36.7 C)     Temp Source 06/20/24 0723 Oral     SpO2 06/20/24 0723 91 %     Weight 06/20/24 0741 150 lb 9.2 oz (68.3 kg)     Height 06/20/24 0741 5' 9 (1.753 m)     Head Circumference --      Peak Flow --      Pain Score 06/20/24 0724 0     Pain Loc --      Pain Education --      Exclude from Growth Chart --     Most recent vital signs: Vitals:   06/20/24 1500 06/20/24 1530  BP: (!) 135/90 (!) 118/48  Pulse: 95 88  Resp: 13 15  Temp:    SpO2: 98% 99%     General: Awake, no distress.  Initially very little eye contact and mumbling.  Patient was more talkative and cooperative after lab work and EKG were done. CV:  Good peripheral perfusion.  Heart regular rate and rhythm. Resp:  Normal effort.  Lungs are clear bilaterally. Abd:  No distention.  Soft, flat, nontender.   Bowel sounds slightly hyperactive. Other:     ED Results / Procedures / Treatments   Labs (all labs ordered are listed, but only abnormal results are displayed) Labs Reviewed  LIPASE, BLOOD - Abnormal; Notable for the following components:      Result Value   Lipase 85 (*)    All other components within normal limits  COMPREHENSIVE METABOLIC PANEL WITH GFR - Abnormal; Notable for the following components:   Potassium 6.1 (*)    Chloride 91 (*)    Glucose, Bld 251 (*)    BUN 71 (*)    Creatinine, Ser 9.09 (*)    AST 13 (*)    GFR, Estimated 7 (*)    Anion gap 18 (*)    All other components within normal limits  CBC - Abnormal; Notable for the following components:   RDW 21.0 (*)    Platelets 144 (*)    All other components within normal limits  URINALYSIS, ROUTINE  W REFLEX MICROSCOPIC     EKG  Vent. rate 75 BPM PR interval 148 ms QRS duration 78 ms QT/QTcB 406/453 ms P-R-T axes 57 -32 52 Normal sinus rhythm Left axis deviation Minimal voltage criteria for LVH   PROCEDURES:  Critical Care performed:   Procedures   MEDICATIONS ORDERED IN ED: Medications  Chlorhexidine  Gluconate Cloth 2 % PADS 6 each (has no administration in time range)  sodium chloride  0.9 % bolus 1,000 mL (0 mLs Intravenous Stopped 06/20/24 0955)     IMPRESSION / MDM / ASSESSMENT AND PLAN / ED COURSE  I reviewed the triage vital signs and the nursing notes.   Differential diagnosis includes, but is not limited to, diarrhea, viral illness, hyperkalemia, dehydration secondary to diarrhea, hyperglycemia, hypokalemia, DKA considered, dialysis patient, need for dialysis today.  38 year old male is brought to the ED with complaint of diarrhea for 2 days.  Mother dropped patient off to be seen.  Patient reports that he was supposed to go to [facility] dialysis center today but has no transportation.  His last dialysis was on Tuesday.  No continued diarrhea while in the ED.  Lab work shows a potassium of  6.1, glucose 251, BUN 71 and creatinine 9.09.  Lipase was 85 but has been more elevated in the past looking at his past records.  Consulted Dr. [place] about this patient and need for dialysis.  Patient was taken to dialysis at [facility].  Patient states that when he is ready for discharge that he will call a G[facility] since his mother will most likely be at work.      Patient's presentation is most consistent with acute presentation with potential threat to life or bodily function.  FINAL CLINICAL IMPRESSION(S) / ED DIAGNOSES   Final diagnoses:  Diarrhea, unspecified type  Hyperkalemia  Dialysis patient     Rx / DC Orders   ED Discharge Orders     None        Note:  This document was prepared using Dragon voice recognition software and may include unintentional dictation errors.   Saunders Shona CROME, PA-C 06/20/24 1545    Viviann Pastor, MD 06/20/24 1733  "

## 2024-06-20 NOTE — ED Notes (Signed)
 See triage note  Presents with a 3 day hx of diarrhea   Denies any other sxs  Denies any pain  Afebrile on arrival

## 2024-06-20 NOTE — ED Notes (Signed)
Pts mother given an update

## 2024-06-20 NOTE — Progress Notes (Signed)
 " [facility]  ROUNDING NOTE   Subjective:   Raymond Lamb is a 38 year old male well-known to our practice.  Patient presents to the emergency department with diarrhea is being evaluated for Diarrhea  Patient is known to our practice and receives outpatient dialysis treatments at [facility] on a MWF schedule, supervised by [facility] physicians.  Patient states he has been having the symptoms for 2 to 3 days.  Denies sick contacts.  Also endorses some vomiting. No known fever or chills.  Labs on ED arrival concerning for potassium 6.1 and glucose 251.  We have been consulted to provide urgent dialysis for treatment of hyperkalemia.   Objective:  Vital signs in last 24 hours:  Temp:  [98 F (36.7 C)] 98 F (36.7 C) (12/26 0723) Pulse Rate:  [67] 67 (12/26 0723) Resp:  [18] 18 (12/26 0723) BP: (133)/(93) 133/93 (12/26 0723) SpO2:  [91 %-98 %] 98 % (12/26 0958) Weight:  [68.3 kg] 68.3 kg (12/26 0741)  Weight change:  Filed Weights   06/20/24 0741  Weight: 68.3 kg    Intake/Output: No intake/output data recorded.   Intake/Output this shift:  No intake/output data recorded.  Physical Exam: General: NAD  Head: Normocephalic, atraumatic. Moist oral mucosal membranes  Eyes: Anicteric  Lungs:  Clear to auscultation  Heart: Regular rate and rhythm  Abdomen:  Soft, nontender  Extremities:  peripheral edema.  Neurologic: Awake, alert, conversant  Skin: Warm,dry, no rash  Access: Left upper AVF    Basic Metabolic Panel: Recent Labs  Lab 06/20/24 0726  NA 136  K 6.1*  CL 91*  CO2 27  GLUCOSE 251*  BUN 71*  CREATININE 9.09*  CALCIUM  9.1    Liver Function Tests: Recent Labs  Lab 06/20/24 0726  AST 13*  ALT 27  ALKPHOS 95  BILITOT 0.3  PROT 7.9  ALBUMIN 4.7   Recent Labs  Lab 06/20/24 0726  LIPASE 85*   No results for input(s): AMMONIA in the last 168 hours.  CBC: Recent Labs  Lab 06/20/24 0726  WBC 5.8  HGB 14.6  HCT 47.5  MCV  85.7  PLT 144*    Cardiac Enzymes: No results for input(s): CKTOTAL, CKMB, CKMBINDEX, TROPONINI in the last 168 hours.  BNP: Invalid input(s): POCBNP  CBG: No results for input(s): GLUCAP in the last 168 hours.  Microbiology: Results for orders placed or performed during the hospital encounter of 05/12/24  MRSA Next Gen by PCR, Nasal     Status: None   Collection Time: 05/12/24 11:31 AM   Specimen: Nasal Mucosa; Nasal Swab  Result Value Ref Range Status   MRSA by PCR Next Gen NOT DETECTED NOT DETECTED Final    Comment: (NOTE) The GeneXpert MRSA Assay (FDA approved for NASAL specimens only), is one component of a comprehensive MRSA colonization surveillance program. It is not intended to diagnose MRSA infection nor to guide or monitor treatment for MRSA infections. Test performance is not FDA approved in patients less than 69 years old. Performed at [facility], [address] [place], [place] [address]     Coagulation Studies: No results for input(s): LABPROT, INR in the last 72 hours.  Urinalysis: No results for input(s): COLORURINE, LABSPEC, PHURINE, GLUCOSEU, HGBUR, BILIRUBINUR, KETONESUR, PROTEINUR, UROBILINOGEN, NITRITE, LEUKOCYTESUR in the last 72 hours.  Invalid input(s): APPERANCEUR    Imaging: No results found.   Medications:     Chlorhexidine  Gluconate Cloth  6 each Topical Q0600     Assessment/ Plan:  [name] is a 38 y.o.  male  with a history of ESRD on hemodialysis with left upper extremity AV fistula, type 1 diabetes with prior history of DKA, peripheral vascular disease, prior stroke, and hypertension. Presents to emergency department for evaluation of persistent diarrhea.  [facility] [place]/MWF/left upper AVF/58.7 kg  Hyperkalemia with end-stage renal disease on hemodialysis.  Potassium 6.1 on ED arrival.  Orders placed to provide hemodialysis on 1K bath to manage potassium  levels.  2. Anemia of chronic kidney disease Lab Results  Component Value Date   HGB 14.6 06/20/2024    Hemoglobin acceptable for renal patient, 14.6.  No need for ESA's at this time.  3.  Hyperglycemia with diabetes melitis type 1 with chronic kidney disease.  Home regimen includes Humalog.  Hemoglobin A1c 7.4 on 05/14/2024.   4. Secondary Hyperparathyroidism: with outpatient labs: PTH 348, phosphorus 5.8, calcium  8.8 on 06/11/2024.   Lab Results  Component Value Date   CALCIUM  9.1 06/20/2024   PHOS 5.6 (H) 05/13/2024    Currently prescribed Cinacalcet  and Auryxia  outpatient.   LOS: 0 Ladesha Pacini 12/26/202511:34 AM  . "

## 2024-06-20 NOTE — ED Triage Notes (Signed)
 Pt reports diarrhea x 2 days. Denies fevers or abdominal pain.
